# Patient Record
Sex: Male | Born: 1955 | Race: White | Hispanic: No | Marital: Married | Smoking: Never smoker
Health system: Southern US, Community
[De-identification: ages and names within clinical notes are randomized; demographics above are authoritative.]

## PROBLEM LIST (undated history)

## (undated) DIAGNOSIS — E785 Hyperlipidemia, unspecified: Secondary | ICD-10-CM

## (undated) DIAGNOSIS — I10 Essential (primary) hypertension: Secondary | ICD-10-CM

## (undated) DIAGNOSIS — N200 Calculus of kidney: Secondary | ICD-10-CM

## (undated) DIAGNOSIS — G4730 Sleep apnea, unspecified: Secondary | ICD-10-CM

## (undated) DIAGNOSIS — I219 Acute myocardial infarction, unspecified: Secondary | ICD-10-CM

## (undated) DIAGNOSIS — T7840XA Allergy, unspecified, initial encounter: Secondary | ICD-10-CM

## (undated) HISTORY — DX: Allergy, unspecified, initial encounter: T78.40XA

## (undated) HISTORY — PX: COLONOSCOPY: SHX174

## (undated) HISTORY — DX: Acute myocardial infarction, unspecified: I21.9

## (undated) HISTORY — DX: Calculus of kidney: N20.0

## (undated) HISTORY — PX: HERNIA REPAIR: SHX51

## (undated) HISTORY — DX: Essential (primary) hypertension: I10

## (undated) HISTORY — PX: OTHER SURGICAL HISTORY: SHX169

## (undated) HISTORY — PX: CARDIAC CATHETERIZATION: SHX172

## (undated) HISTORY — DX: Hyperlipidemia, unspecified: E78.5

## (undated) HISTORY — DX: Sleep apnea, unspecified: G47.30

---

## 2000-07-17 ENCOUNTER — Ambulatory Visit (HOSPITAL_BASED_OUTPATIENT_CLINIC_OR_DEPARTMENT_OTHER): Admission: RE | Admit: 2000-07-17 | Discharge: 2000-07-17 | Payer: Self-pay | Admitting: Internal Medicine

## 2004-02-24 ENCOUNTER — Ambulatory Visit: Payer: Self-pay | Admitting: Internal Medicine

## 2004-03-02 ENCOUNTER — Ambulatory Visit: Payer: Self-pay | Admitting: Internal Medicine

## 2004-06-03 ENCOUNTER — Ambulatory Visit: Payer: Self-pay | Admitting: Internal Medicine

## 2004-06-21 ENCOUNTER — Ambulatory Visit: Payer: Self-pay | Admitting: Internal Medicine

## 2004-08-09 ENCOUNTER — Ambulatory Visit: Payer: Self-pay | Admitting: Internal Medicine

## 2004-09-21 ENCOUNTER — Ambulatory Visit: Payer: Self-pay | Admitting: Internal Medicine

## 2004-10-11 ENCOUNTER — Ambulatory Visit: Payer: Self-pay | Admitting: Internal Medicine

## 2005-02-06 ENCOUNTER — Ambulatory Visit: Payer: Self-pay | Admitting: Internal Medicine

## 2005-02-13 ENCOUNTER — Ambulatory Visit: Payer: Self-pay | Admitting: Internal Medicine

## 2005-06-06 ENCOUNTER — Ambulatory Visit: Payer: Self-pay | Admitting: Internal Medicine

## 2005-06-13 ENCOUNTER — Ambulatory Visit: Payer: Self-pay | Admitting: Internal Medicine

## 2005-06-19 ENCOUNTER — Ambulatory Visit: Payer: Self-pay | Admitting: Endocrinology

## 2005-06-22 ENCOUNTER — Ambulatory Visit (HOSPITAL_BASED_OUTPATIENT_CLINIC_OR_DEPARTMENT_OTHER): Admission: RE | Admit: 2005-06-22 | Discharge: 2005-06-22 | Payer: Self-pay | Admitting: Internal Medicine

## 2005-07-04 ENCOUNTER — Ambulatory Visit: Payer: Self-pay | Admitting: Pulmonary Disease

## 2005-07-10 ENCOUNTER — Ambulatory Visit: Payer: Self-pay | Admitting: Endocrinology

## 2005-08-09 ENCOUNTER — Ambulatory Visit: Payer: Self-pay | Admitting: Endocrinology

## 2005-08-17 ENCOUNTER — Ambulatory Visit: Payer: Self-pay | Admitting: Internal Medicine

## 2005-08-23 ENCOUNTER — Ambulatory Visit: Payer: Self-pay | Admitting: Internal Medicine

## 2005-09-21 ENCOUNTER — Ambulatory Visit: Payer: Self-pay | Admitting: Internal Medicine

## 2005-10-04 ENCOUNTER — Ambulatory Visit: Payer: Self-pay | Admitting: Internal Medicine

## 2005-10-10 ENCOUNTER — Ambulatory Visit: Payer: Self-pay | Admitting: Internal Medicine

## 2005-10-26 ENCOUNTER — Ambulatory Visit: Payer: Self-pay | Admitting: Internal Medicine

## 2006-01-29 ENCOUNTER — Ambulatory Visit: Payer: Self-pay | Admitting: Internal Medicine

## 2006-01-29 LAB — CONVERTED CEMR LAB
ALT: 22 units/L (ref 0–40)
AST: 20 units/L (ref 0–37)
Albumin: 4.1 g/dL (ref 3.5–5.2)
Alkaline Phosphatase: 35 units/L — ABNORMAL LOW (ref 39–117)
BUN: 15 mg/dL (ref 6–23)
Bilirubin, Direct: 0.1 mg/dL (ref 0.0–0.3)
CO2: 28 meq/L (ref 19–32)
Calcium: 9.7 mg/dL (ref 8.4–10.5)
Chloride: 103 meq/L (ref 96–112)
Chol/HDL Ratio, serum: 5.1
Cholesterol: 183 mg/dL (ref 0–200)
Creatinine, Ser: 1 mg/dL (ref 0.4–1.5)
GFR calc non Af Amer: 84 mL/min
Glomerular Filtration Rate, Af Am: 102 mL/min/{1.73_m2}
Glucose, Bld: 253 mg/dL — ABNORMAL HIGH (ref 70–99)
HDL: 36.1 mg/dL — ABNORMAL LOW (ref >39.0)
Hgb A1c MFr Bld: 9.9 % — ABNORMAL HIGH (ref 4.6–6.0)
LDL Cholesterol: 112 mg/dL — ABNORMAL HIGH (ref 0–99)
Potassium: 5.1 meq/L (ref 3.5–5.1)
Sodium: 138 meq/L (ref 135–145)
Total Bilirubin: 0.8 mg/dL (ref 0.3–1.2)
Total Protein: 7.1 g/dL (ref 6.0–8.3)
Triglyceride fasting, serum: 176 mg/dL — ABNORMAL HIGH (ref 0–149)
VLDL: 35 mg/dL (ref 0–40)

## 2006-02-04 DIAGNOSIS — E1169 Type 2 diabetes mellitus with other specified complication: Secondary | ICD-10-CM | POA: Insufficient documentation

## 2006-02-04 DIAGNOSIS — Z794 Long term (current) use of insulin: Secondary | ICD-10-CM

## 2006-02-04 DIAGNOSIS — Z87442 Personal history of urinary calculi: Secondary | ICD-10-CM | POA: Insufficient documentation

## 2006-02-04 DIAGNOSIS — E119 Type 2 diabetes mellitus without complications: Secondary | ICD-10-CM

## 2006-02-04 DIAGNOSIS — I10 Essential (primary) hypertension: Secondary | ICD-10-CM | POA: Insufficient documentation

## 2006-02-04 DIAGNOSIS — E1159 Type 2 diabetes mellitus with other circulatory complications: Secondary | ICD-10-CM

## 2006-02-04 DIAGNOSIS — E785 Hyperlipidemia, unspecified: Secondary | ICD-10-CM | POA: Insufficient documentation

## 2006-02-04 HISTORY — DX: Type 2 diabetes mellitus without complications: E11.9

## 2006-02-05 ENCOUNTER — Ambulatory Visit: Payer: Self-pay | Admitting: Internal Medicine

## 2006-04-18 ENCOUNTER — Ambulatory Visit: Payer: Self-pay | Admitting: Internal Medicine

## 2006-05-21 ENCOUNTER — Ambulatory Visit: Payer: Self-pay | Admitting: Internal Medicine

## 2006-05-29 ENCOUNTER — Ambulatory Visit: Payer: Self-pay | Admitting: Internal Medicine

## 2006-05-29 LAB — CONVERTED CEMR LAB
ALT: 18 units/L (ref 0–40)
AST: 16 units/L (ref 0–37)
Albumin: 3.6 g/dL (ref 3.5–5.2)
Alkaline Phosphatase: 39 units/L (ref 39–117)
BUN: 15 mg/dL (ref 6–23)
Bilirubin, Direct: 0.1 mg/dL (ref 0.0–0.3)
CO2: 28 meq/L (ref 19–32)
Calcium: 9.3 mg/dL (ref 8.4–10.5)
Chloride: 105 meq/L (ref 96–112)
Cholesterol: 145 mg/dL (ref 0–200)
Creatinine, Ser: 0.9 mg/dL (ref 0.4–1.5)
Creatinine,U: 58.6 mg/dL
GFR calc Af Amer: 115 mL/min
GFR calc non Af Amer: 95 mL/min
Glucose, Bld: 161 mg/dL — ABNORMAL HIGH (ref 70–99)
HDL: 35.5 mg/dL — ABNORMAL LOW (ref >39.0)
Hgb A1c MFr Bld: 9.8 % — ABNORMAL HIGH (ref 4.6–6.0)
LDL Cholesterol: 81 mg/dL (ref 0–99)
Microalb Creat Ratio: 13.7 mg/g (ref 0.0–30.0)
Microalb, Ur: 0.8 mg/dL (ref 0.0–1.9)
Potassium: 5.2 meq/L — ABNORMAL HIGH (ref 3.5–5.1)
Sodium: 140 meq/L (ref 135–145)
Total Bilirubin: 0.6 mg/dL (ref 0.3–1.2)
Total CHOL/HDL Ratio: 4.1
Total Protein: 6.9 g/dL (ref 6.0–8.3)
Triglycerides: 141 mg/dL (ref 0–149)
VLDL: 28 mg/dL (ref 0–40)

## 2006-06-05 ENCOUNTER — Encounter: Payer: Self-pay | Admitting: Internal Medicine

## 2006-06-05 ENCOUNTER — Ambulatory Visit: Payer: Self-pay | Admitting: Internal Medicine

## 2006-06-05 DIAGNOSIS — K602 Anal fissure, unspecified: Secondary | ICD-10-CM

## 2006-06-05 HISTORY — DX: Anal fissure, unspecified: K60.2

## 2006-06-05 LAB — CONVERTED CEMR LAB
Cholesterol, target level: 200 mg/dL
HDL goal, serum: 40 mg/dL
LDL Goal: 100 mg/dL

## 2006-08-28 ENCOUNTER — Ambulatory Visit: Payer: Self-pay | Admitting: Internal Medicine

## 2006-08-28 LAB — CONVERTED CEMR LAB
ALT: 21 units/L (ref 0–40)
AST: 19 units/L (ref 0–37)
Albumin: 3.8 g/dL (ref 3.5–5.2)
Alkaline Phosphatase: 31 units/L — ABNORMAL LOW (ref 39–117)
BUN: 15 mg/dL (ref 6–23)
Bilirubin, Direct: 0.1 mg/dL (ref 0.0–0.3)
CO2: 29 meq/L (ref 19–32)
Calcium: 9.5 mg/dL (ref 8.4–10.5)
Chloride: 107 meq/L (ref 96–112)
Cholesterol: 254 mg/dL (ref 0–200)
Creatinine, Ser: 0.8 mg/dL (ref 0.4–1.5)
Direct LDL: 161.1 mg/dL
GFR calc Af Amer: 131 mL/min
GFR calc non Af Amer: 108 mL/min
Glucose, Bld: 176 mg/dL — ABNORMAL HIGH (ref 70–99)
HDL: 41.2 mg/dL (ref >39.0)
Hgb A1c MFr Bld: 8.8 % — ABNORMAL HIGH (ref 4.6–6.0)
Potassium: 4.9 meq/L (ref 3.5–5.1)
Sodium: 140 meq/L (ref 135–145)
Total Bilirubin: 0.9 mg/dL (ref 0.3–1.2)
Total CHOL/HDL Ratio: 6.2
Total Protein: 6.7 g/dL (ref 6.0–8.3)
Triglycerides: 238 mg/dL (ref 0–149)
VLDL: 48 mg/dL — ABNORMAL HIGH (ref 0–40)

## 2006-09-04 ENCOUNTER — Ambulatory Visit: Payer: Self-pay | Admitting: Internal Medicine

## 2006-11-07 ENCOUNTER — Encounter: Payer: Self-pay | Admitting: Endocrinology

## 2006-11-21 ENCOUNTER — Ambulatory Visit: Payer: Self-pay | Admitting: Internal Medicine

## 2006-12-24 ENCOUNTER — Ambulatory Visit: Payer: Self-pay | Admitting: Internal Medicine

## 2006-12-24 LAB — CONVERTED CEMR LAB
ALT: 20 units/L (ref 0–53)
AST: 18 units/L (ref 0–37)
Albumin: 3.8 g/dL (ref 3.5–5.2)
Alkaline Phosphatase: 37 units/L — ABNORMAL LOW (ref 39–117)
BUN: 20 mg/dL (ref 6–23)
Bilirubin, Direct: 0.2 mg/dL (ref 0.0–0.3)
CO2: 29 meq/L (ref 19–32)
Calcium: 9.5 mg/dL (ref 8.4–10.5)
Chloride: 106 meq/L (ref 96–112)
Cholesterol: 182 mg/dL (ref 0–200)
Creatinine, Ser: 0.8 mg/dL (ref 0.4–1.5)
GFR calc Af Amer: 131 mL/min
GFR calc non Af Amer: 108 mL/min
Glucose, Bld: 183 mg/dL — ABNORMAL HIGH (ref 70–99)
HDL: 32.9 mg/dL — ABNORMAL LOW (ref >39.0)
Hgb A1c MFr Bld: 10.4 % — ABNORMAL HIGH (ref 4.6–6.0)
LDL Cholesterol: 111 mg/dL — ABNORMAL HIGH (ref 0–99)
Potassium: 4.8 meq/L (ref 3.5–5.1)
Sodium: 142 meq/L (ref 135–145)
Total Bilirubin: 0.9 mg/dL (ref 0.3–1.2)
Total CHOL/HDL Ratio: 5.5
Total Protein: 6.3 g/dL (ref 6.0–8.3)
Triglycerides: 192 mg/dL — ABNORMAL HIGH (ref 0–149)
VLDL: 38 mg/dL (ref 0–40)

## 2007-01-02 ENCOUNTER — Ambulatory Visit: Payer: Self-pay | Admitting: Internal Medicine

## 2007-03-08 ENCOUNTER — Telehealth: Payer: Self-pay | Admitting: Internal Medicine

## 2007-04-29 ENCOUNTER — Ambulatory Visit: Payer: Self-pay | Admitting: Internal Medicine

## 2007-04-29 LAB — CONVERTED CEMR LAB
Albumin: 4 g/dL (ref 3.5–5.2)
BUN: 16 mg/dL (ref 6–23)
GFR calc non Af Amer: 95 mL/min
HDL: 35.9 mg/dL — ABNORMAL LOW (ref >39.0)
Hgb A1c MFr Bld: 9.3 % — ABNORMAL HIGH (ref 4.6–6.0)
LDL Cholesterol: 91 mg/dL (ref 0–99)
Potassium: 4.6 meq/L (ref 3.5–5.1)
Sodium: 139 meq/L (ref 135–145)
Triglycerides: 144 mg/dL (ref 0–149)
VLDL: 29 mg/dL (ref 0–40)

## 2007-05-06 ENCOUNTER — Ambulatory Visit: Payer: Self-pay | Admitting: Internal Medicine

## 2007-05-06 LAB — HM DIABETES FOOT EXAM

## 2007-08-27 ENCOUNTER — Ambulatory Visit: Payer: Self-pay | Admitting: Internal Medicine

## 2007-08-27 LAB — CONVERTED CEMR LAB
Albumin: 3.7 g/dL (ref 3.5–5.2)
Alkaline Phosphatase: 33 units/L — ABNORMAL LOW (ref 39–117)
BUN: 20 mg/dL (ref 6–23)
Calcium: 9.6 mg/dL (ref 8.4–10.5)
Cholesterol: 153 mg/dL (ref 0–200)
Creatinine, Ser: 0.8 mg/dL (ref 0.4–1.5)
GFR calc Af Amer: 131 mL/min
Glucose, Bld: 211 mg/dL — ABNORMAL HIGH (ref 70–99)
HDL: 35.1 mg/dL — ABNORMAL LOW (ref >39.0)
Total Protein: 6.5 g/dL (ref 6.0–8.3)
Triglycerides: 174 mg/dL — ABNORMAL HIGH (ref 0–149)
VLDL: 35 mg/dL (ref 0–40)

## 2007-09-23 ENCOUNTER — Ambulatory Visit: Payer: Self-pay | Admitting: Internal Medicine

## 2007-11-25 ENCOUNTER — Observation Stay (HOSPITAL_COMMUNITY): Admission: EM | Admit: 2007-11-25 | Discharge: 2007-11-26 | Payer: Self-pay | Admitting: Emergency Medicine

## 2007-12-05 ENCOUNTER — Telehealth (INDEPENDENT_AMBULATORY_CARE_PROVIDER_SITE_OTHER): Payer: Self-pay | Admitting: *Deleted

## 2008-01-13 ENCOUNTER — Ambulatory Visit: Payer: Self-pay | Admitting: Internal Medicine

## 2008-01-13 LAB — CONVERTED CEMR LAB
ALT: 25 units/L (ref 0–53)
AST: 20 units/L (ref 0–37)
CO2: 27 meq/L (ref 19–32)
Chloride: 104 meq/L (ref 96–112)
Cholesterol: 149 mg/dL (ref 0–200)
Creatinine, Ser: 0.8 mg/dL (ref 0.4–1.5)
Hgb A1c MFr Bld: 9.2 % — ABNORMAL HIGH (ref 4.6–6.0)
Total Bilirubin: 0.7 mg/dL (ref 0.3–1.2)
Total CHOL/HDL Ratio: 4.2
Triglycerides: 128 mg/dL (ref 0–149)

## 2008-01-20 ENCOUNTER — Ambulatory Visit: Payer: Self-pay | Admitting: Internal Medicine

## 2008-05-04 ENCOUNTER — Ambulatory Visit: Payer: Self-pay | Admitting: Internal Medicine

## 2008-05-04 LAB — CONVERTED CEMR LAB
Alkaline Phosphatase: 32 units/L — ABNORMAL LOW (ref 39–117)
Bilirubin, Direct: 0.1 mg/dL (ref 0.0–0.3)
GFR calc Af Amer: 131 mL/min
GFR calc non Af Amer: 108 mL/min
LDL Cholesterol: 70 mg/dL (ref 0–99)
Potassium: 4.5 meq/L (ref 3.5–5.1)
Sodium: 136 meq/L (ref 135–145)
Total Bilirubin: 0.9 mg/dL (ref 0.3–1.2)
VLDL: 37 mg/dL (ref 0–40)

## 2008-05-11 ENCOUNTER — Ambulatory Visit: Payer: Self-pay | Admitting: Internal Medicine

## 2008-05-11 LAB — HM COLONOSCOPY

## 2008-08-26 ENCOUNTER — Ambulatory Visit: Payer: Self-pay | Admitting: Internal Medicine

## 2008-08-26 LAB — CONVERTED CEMR LAB
Albumin: 3.7 g/dL (ref 3.5–5.2)
CO2: 27 meq/L (ref 19–32)
Calcium: 9 mg/dL (ref 8.4–10.5)
Chloride: 112 meq/L (ref 96–112)
HDL: 38.8 mg/dL — ABNORMAL LOW (ref >39.00)
Hgb A1c MFr Bld: 9.9 % — ABNORMAL HIGH (ref 4.6–6.5)
LDL Cholesterol: 103 mg/dL — ABNORMAL HIGH (ref 0–99)
Sodium: 143 meq/L (ref 135–145)
Total CHOL/HDL Ratio: 4
Triglycerides: 131 mg/dL (ref 0.0–149.0)

## 2008-09-02 ENCOUNTER — Ambulatory Visit: Payer: Self-pay | Admitting: Internal Medicine

## 2008-12-30 ENCOUNTER — Ambulatory Visit: Payer: Self-pay | Admitting: Internal Medicine

## 2008-12-30 LAB — CONVERTED CEMR LAB
ALT: 25 units/L (ref 0–53)
Albumin: 4 g/dL (ref 3.5–5.2)
Alkaline Phosphatase: 37 units/L — ABNORMAL LOW (ref 39–117)
Chloride: 98 meq/L (ref 96–112)
Cholesterol: 126 mg/dL (ref 0–200)
Creatinine, Ser: 0.8 mg/dL (ref 0.4–1.5)
GFR calc non Af Amer: 107.32 mL/min (ref >60)
Hgb A1c MFr Bld: 10.1 % — ABNORMAL HIGH (ref 4.6–6.5)
LDL Cholesterol: 58 mg/dL (ref 0–99)
Total Protein: 6.6 g/dL (ref 6.0–8.3)
Triglycerides: 184 mg/dL — ABNORMAL HIGH (ref 0.0–149.0)
VLDL: 36.8 mg/dL (ref 0.0–40.0)

## 2009-01-07 ENCOUNTER — Ambulatory Visit: Payer: Self-pay | Admitting: Internal Medicine

## 2009-05-04 ENCOUNTER — Ambulatory Visit: Payer: Self-pay | Admitting: Internal Medicine

## 2009-05-04 LAB — CONVERTED CEMR LAB
ALT: 19 units/L (ref 0–53)
AST: 17 units/L (ref 0–37)
Alkaline Phosphatase: 44 units/L (ref 39–117)
BUN: 15 mg/dL (ref 6–23)
Bilirubin, Direct: 0.1 mg/dL (ref 0.0–0.3)
Calcium: 9.2 mg/dL (ref 8.4–10.5)
Cholesterol: 124 mg/dL (ref 0–200)
Creatinine, Ser: 0.8 mg/dL (ref 0.4–1.5)
GFR calc non Af Amer: 107.18 mL/min (ref >60)
Glucose, Bld: 182 mg/dL — ABNORMAL HIGH (ref 70–99)
Hgb A1c MFr Bld: 10.6 % — ABNORMAL HIGH (ref 4.6–6.5)
Potassium: 4.1 meq/L (ref 3.5–5.1)
Total Bilirubin: 0.6 mg/dL (ref 0.3–1.2)
Total Protein: 6.7 g/dL (ref 6.0–8.3)
VLDL: 31.4 mg/dL (ref 0.0–40.0)

## 2009-05-11 ENCOUNTER — Ambulatory Visit: Payer: Self-pay | Admitting: Internal Medicine

## 2009-09-24 ENCOUNTER — Ambulatory Visit: Payer: Self-pay | Admitting: Internal Medicine

## 2009-09-24 LAB — CONVERTED CEMR LAB
ALT: 21 units/L (ref 0–53)
AST: 17 units/L (ref 0–37)
Albumin: 4 g/dL (ref 3.5–5.2)
Alkaline Phosphatase: 43 units/L (ref 39–117)
BUN: 16 mg/dL (ref 6–23)
Chloride: 105 meq/L (ref 96–112)
GFR calc non Af Amer: 102.57 mL/min (ref >60)
Glucose, Bld: 188 mg/dL — ABNORMAL HIGH (ref 70–99)
Hgb A1c MFr Bld: 10.1 % — ABNORMAL HIGH (ref 4.6–6.5)
Potassium: 4.8 meq/L (ref 3.5–5.1)
Sodium: 140 meq/L (ref 135–145)

## 2009-09-24 LAB — HM DIABETES EYE EXAM

## 2009-10-01 ENCOUNTER — Ambulatory Visit: Payer: Self-pay | Admitting: Internal Medicine

## 2009-10-11 ENCOUNTER — Telehealth: Payer: Self-pay | Admitting: Internal Medicine

## 2009-10-11 DIAGNOSIS — M25519 Pain in unspecified shoulder: Secondary | ICD-10-CM

## 2010-01-28 ENCOUNTER — Ambulatory Visit: Payer: Self-pay | Admitting: Internal Medicine

## 2010-01-28 LAB — CONVERTED CEMR LAB
AST: 21 units/L (ref 0–37)
Alkaline Phosphatase: 43 units/L (ref 39–117)
Bilirubin, Direct: 0.1 mg/dL (ref 0.0–0.3)
CO2: 24 meq/L (ref 19–32)
Calcium: 9.2 mg/dL (ref 8.4–10.5)
GFR calc non Af Amer: 106.88 mL/min (ref >60)
Glucose, Bld: 150 mg/dL — ABNORMAL HIGH (ref 70–99)
HDL: 36.5 mg/dL — ABNORMAL LOW (ref >39.00)
Potassium: 4.4 meq/L (ref 3.5–5.1)
Sodium: 137 meq/L (ref 135–145)
Total CHOL/HDL Ratio: 5
Triglycerides: 213 mg/dL — ABNORMAL HIGH (ref 0.0–149.0)

## 2010-02-04 ENCOUNTER — Ambulatory Visit: Payer: Self-pay | Admitting: Internal Medicine

## 2010-04-26 NOTE — Assessment & Plan Note (Signed)
Summary: fup on labs//ccm [facility] BMP/NJR   Vital Signs:  Patient profile:   55 year old male Weight:      250 pounds Temp:     98.5 degrees F oral Pulse rate:   68 / minute Pulse rhythm:   regular Resp:     12 per minute BP sitting:   144 / 78  (left arm) Cuff size:   regular  Vitals Entered By: Gladis Riffle, RN (October 01, 2009 9:45 AM) CC: FU labs--has not checked CBGs lately Is Patient Diabetic? Yes Did you bring your meter with you today? No   CC:  FU labs--has not checked CBGs lately.  History of Present Illness:  Follow-Up Visit      This is a 55 year old man who presents for Follow-up visit.  The patient denies chest pain and palpitations.  Since the last visit the patient notes no new problems or concerns (2 weeks ago with kidney stone---resovled).  The patient reports taking meds as prescribed.  When questioned about possible medication side effects, the patient notes none.    All other systems reviewed and were negative   Preventive Screening-Counseling & Management  Alcohol-Tobacco     Smoking Status: never  Current Problems (verified): 1)  Rectal Fissure  (ICD-565.0) 2)  Nephrolithiasis, Hx of  (ICD-V13.01) 3)  Hypertension  (ICD-401.9) 4)  Hyperlipidemia  (ICD-272.4) 5)  Diabetes Mellitus, Type II  (ICD-250.00)  Current Medications (verified): 1)  Astelin 137 Mcg/spray Soln (Azelastine Hcl) .... Inhale 1 Puff Into Both Nostrils Twice A Day As Needed 2)  Bayer Childrens Aspirin 81 Mg Chew (Aspirin) .... Take 1 Tablet By Mouth Once A Day 3)  Glucovance 2.5-500 Mg Tabs (Glyburide-Metformin) .... Take Two Tablets Two Times A Day 4)  Humalog Kwikpen 100 Unit/ml Soln (Insulin Lispro (Human)) .... Inject As Directed--(15am and 25 Pmsupper) 5)  Lantus 100 Unit/ml Soln (Insulin Glargine) .... 80units Q Hs 6)  Lisinopril 40 Mg Tabs (Lisinopril) .... Take 1 Tablet By Mouth Once A Day 7)  Simvastatin 80 Mg Tabs (Simvastatin) .Marland Kitchen.. 1 Po At Bedtime 8)  Onetouch Test   Strp  (Glucose Blood) .... Use Once Daily and As Directed 9)  Niacin 500 Mg Tabs (Niacin) .... .qhstab  Allergies (verified): No Known Drug Allergies  Past History:  Past Medical History: Last updated: 02/04/2006 Diabetes mellitus, type II Hyperlipidemia Hypertension Nephrolithiasis, hx of OSA  Past Surgical History: Last updated: 02/04/2006 Inguinal herniorrhaphy  Family History: Last updated: 05/06/2007 Family History of CAD Male 1st degree relative <50 Family History Diabetes 1st degree relative father deceased 28 yo---liver disease---ruptured spleen, hx of heart disease.  Social History: Last updated: 06/05/2006 Occupation: Married Never Smoked Alcohol use-no Regular exercise-yes  Risk Factors: Exercise: yes (06/05/2006)  Risk Factors: Smoking Status: never (10/01/2009)  Physical Exam  General:  alert and well-developed.   Head:  normocephalic and atraumatic.   Eyes:  pupils equal and pupils round.   Ears:  R ear normal and L ear normal.   Neck:  No deformities, masses, or tenderness noted. Chest Wall:  No deformities, masses, tenderness or gynecomastia noted. Lungs:  normal respiratory effort and no intercostal retractions.   Heart:  normal rate and regular rhythm.   Abdomen:  Bowel sounds positive,abdomen soft and non-tender without masses, organomegaly or hernias noted.  obese Msk:  No deformity or scoliosis noted of thoracic or lumbar spine.   decreased abduction left worse than right Neurologic:  cranial nerves II-XII intact and gait  normal.    Diabetes Management Exam:    Eye Exam:       Eye Exam done elsewhere          Date: 09/24/2009          Results: normal          Done by: ophth   Impression & Recommendations:  Problem # 1:  DIABETES MELLITUS, TYPE II (ICD-250.00) not controlled advised aggressive weight loss he has been out of humalog for 6 weeks---resume His updated medication list for this problem includes:    Bayer Childrens  Aspirin 81 Mg Chew (Aspirin) .Marland Kitchen... Take 1 tablet by mouth once a day    Glucovance 2.5-500 Mg Tabs (Glyburide-metformin) .Marland Kitchen... Take two tablets two times a day    Humalog Kwikpen 100 Unit/ml Soln (Insulin lispro (human)) ..... Inject as directed--(15am and 25 pmsupper)    Lantus 100 Unit/ml Soln (Insulin glargine) .Marland KitchenMarland KitchenMarland KitchenMarland Kitchen 80units q hs    Lisinopril 40 Mg Tabs (Lisinopril) .Marland Kitchen... Take 1 tablet by mouth once a day  Labs Reviewed: Creat: 0.8 (09/24/2009)     Last Eye Exam: normal-pt's report (08/25/2008) Reviewed HgBA1c results: 10.1 (09/24/2009)  10.6 (05/04/2009)  Problem # 2:  HYPERLIPIDEMIA (ICD-272.4) controlled he had self dcd simvastatin to 40 mg by mouth once daily (or sometimes less--- every other day) continue current medications   His updated medication list for this problem includes:    Simvastatin 80 Mg Tabs (Simvastatin) .Marland Kitchen... 1/2 po at bedtime    Niacin 500 Mg Tabs (Niacin) ..... .qhstab  Labs Reviewed: SGOT: 17 (09/24/2009)   SGPT: 21 (09/24/2009)  Lipid Goals: Chol Goal: 200 (06/05/2006)   HDL Goal: 40 (06/05/2006)   LDL Goal: 100 (06/05/2006)   TG Goal: 150 (06/05/2006)  Prior 10 Yr Risk Heart Disease: 18 % (01/02/2007)   HDL:37.30 (09/24/2009), 36.00 (05/04/2009)  LDL:57 (05/04/2009), 58 (12/30/2008)  Chol:180 (09/24/2009), 124 (05/04/2009)  Trig:240.0 (09/24/2009), 157.0 (05/04/2009)  Problem # 3:  RECTAL FISSURE (ICD-565.0) resolved  Problem # 4:  HYPERTENSION (ICD-401.9) best treatment option is weight loss His updated medication list for this problem includes:    Lisinopril 40 Mg Tabs (Lisinopril) .Marland Kitchen... Take 1 tablet by mouth once a day  BP today: 144/78 Prior BP: 138/82 (05/11/2009)  Prior 10 Yr Risk Heart Disease: 18 % (01/02/2007)  Labs Reviewed: K+: 4.8 (09/24/2009) Creat: : 0.8 (09/24/2009)   Chol: 180 (09/24/2009)   HDL: 37.30 (09/24/2009)   LDL: 57 (05/04/2009)   TG: 240.0 (09/24/2009)  Complete Medication List: 1)  Astelin 137 Mcg/spray Soln  (Azelastine hcl) .... Inhale 1 puff into both nostrils twice a day as needed 2)  Bayer Childrens Aspirin 81 Mg Chew (Aspirin) .... Take 1 tablet by mouth once a day 3)  Glucovance 2.5-500 Mg Tabs (Glyburide-metformin) .... Take two tablets two times a day 4)  Humalog Kwikpen 100 Unit/ml Soln (Insulin lispro (human)) .... Inject as directed--(15am and 25 pmsupper) 5)  Lantus 100 Unit/ml Soln (Insulin glargine) .... 80units q hs 6)  Lisinopril 40 Mg Tabs (Lisinopril) .... Take 1 tablet by mouth once a day 7)  Simvastatin 80 Mg Tabs (Simvastatin) .... 1/2 po at bedtime 8)  Onetouch Test Strp (Glucose blood) .... Use once daily and as directed 9)  Niacin 500 Mg Tabs (Niacin) .... .qhstab 10)  Hydrocodone-acetaminophen 5-325 Mg Tabs (Hydrocodone-acetaminophen) .Marland Kitchen.. 1 by mouth up to 4 times per day as needed for pain  Patient Instructions: 1)  Please schedule a follow-up appointment in 4 months. 2)  labs one week prior to visit 3)  lipids---272.4 4)  lfts-995.2 5)  bmet-995.2 6)  A1C-250.02 7)     Prescriptions: HYDROCODONE-ACETAMINOPHEN 5-325 MG TABS (HYDROCODONE-ACETAMINOPHEN) 1 by mouth up to 4 times per day as needed for pain  #20 x 0   Entered and Authorized by:   Birdie Sons MD   Signed by:   Birdie Sons MD on 10/01/2009   Method used:   Print then Give to Patient   RxID:   [id number] LANTUS 100 UNIT/ML SOLN (INSULIN GLARGINE) 80UNITS Q HS  #56ml x 5   Entered and Authorized by:   Birdie Sons MD   Signed by:   Birdie Sons MD on 10/01/2009   Method used:   Electronically to        [facility] (retail)       [address]       [place], [place]  [address]       Ph: [phone] or [phone]       Fax: [phone]   RxID:   [id number] HUMALOG KWIKPEN 100 UNIT/ML SOLN (INSULIN LISPRO (HUMAN)) Inject as directed--(15AM AND 25 PMsupper)  #3 boxes x 11   Entered and Authorized by:   Birdie Sons MD   Signed by:   Birdie Sons  MD on 10/01/2009   Method used:   Electronically to        [facility] (retail)       [address]       [place], [place]  [address]       Ph: [phone] or [phone]       Fax: [phone]   RxID:   [id number]

## 2010-04-26 NOTE — Progress Notes (Signed)
Summary: PT appt.  Phone Note Call from Patient   Caller: Patient Call For: Birdie Sons MD Summary of Call: Pt is requesting appt at [facility]. [phone] States he is needing therapy on both shoulders, and he has spoken to Dr. Cato Mulligan about this previously in an office visit. [phone] Initial call taken by: Lynann Beaver CMA,  October 11, 2009 9:09 AM  Follow-up for Phone Call        ok Follow-up by: Birdie Sons MD,  October 11, 2009 1:12 PM  Additional Follow-up for Phone Call Additional follow up Details #1::        order in process.  Patient notified. will await when etc. Additional Follow-up by: Gladis Riffle, RN,  October 11, 2009 2:17 PM  New Problems: SHOULDER PAIN (ICD-719.41)   New Problems: SHOULDER PAIN (ICD-719.41)

## 2010-04-26 NOTE — Assessment & Plan Note (Signed)
Summary: 4 month rov/njr   Vital Signs:  Patient profile:   55 year old male Height:      70.5 inches Weight:      255 pounds BMI:     36.20 Temp:     98.9 degrees F oral Pulse rate:   76 / minute Pulse rhythm:   regular BP sitting:   146 / 86  (left arm) Cuff size:   large  Vitals Entered By: Alfred Levins, CMA (February 04, 2010 8:51 AM) CC: discuss labs   CC:  discuss labs.  History of Present Illness:  Follow-Up Visit      This is a 55 year old man who presents for Follow-up visit.  The patient denies chest pain and palpitations.  Since the last visit the patient notes no new problems or concerns.  The patient reports taking meds as prescribed (admits to missing several mealtime humalog doses), not monitoring blood sugars, dietary noncompliance, and not exercising.  When questioned about possible medication side effects, the patient notes none.    All other systems reviewed and were negative   Current Medications (verified): 1)  Astelin 137 Mcg/spray Soln (Azelastine Hcl) .... Inhale 1 Puff Into Both Nostrils Twice A Day As Needed 2)  Bayer Childrens Aspirin 81 Mg Chew (Aspirin) .... Take 1 Tablet By Mouth Once A Day 3)  Glucovance 2.5-500 Mg Tabs (Glyburide-Metformin) .... Take Two Tablets Two Times A Day 4)  Humalog Kwikpen 100 Unit/ml Soln (Insulin Lispro (Human)) .... Inject As Directed--(15am and 25 Pmsupper) 5)  Lantus 100 Unit/ml Soln (Insulin Glargine) .... 80units Q Hs 6)  Lisinopril 40 Mg Tabs (Lisinopril) .... Take 1 Tablet By Mouth Once A Day 7)  Simvastatin 80 Mg Tabs (Simvastatin) .... 1/2 Po At Bedtime 8)  Onetouch Test   Strp (Glucose Blood) .... Use Once Daily and As Directed 9)  Niacin 500 Mg Tabs (Niacin) .... .qhstab  Allergies (verified): No Known Drug Allergies  Physical Exam  General:  overweight male in no acute distress. HEENT exam atraumatic, normocephalic symmetric her muscles are intact. Neck is supple without lymphadenopathy or thyromegaly.  Chest clear to auscultation without increased work of breathing. Cardiac exam S1-S2 are regular. Abdominal exam active, soft and nontender extremities no clubbing cyanosis or edema.   Impression & Recommendations:  Problem # 1:  HYPERTENSION (ICD-401.9) I think the best treatment option here is weight loss. Discussed with the patient. If he is unable to do that by next visit we'll consider additional medications. His updated medication list for this problem includes:    Lisinopril 40 Mg Tabs (Lisinopril) .Marland Kitchen... Take 1 tablet by mouth once a day  BP today: 146/86 Prior BP: 144/78 (10/01/2009)  Prior 10 Yr Risk Heart Disease: 18 % (01/02/2007)  Labs Reviewed: K+: 4.4 (01/28/2010) Creat: : 0.8 (01/28/2010)   Chol: 173 (01/28/2010)   HDL: 36.50 (01/28/2010)   LDL: 57 (05/04/2009)   TG: 213.0 (01/28/2010)  Problem # 2:  HYPERLIPIDEMIA (ICD-272.4) fair control. Continue current medications. His updated medication list for this problem includes:    Simvastatin 80 Mg Tabs (Simvastatin) .Marland Kitchen... 1/2 po at bedtime    Niacin 500 Mg Tabs (Niacin) ..... .qhstab  Labs Reviewed: SGOT: 21 (01/28/2010)   SGPT: 25 (01/28/2010)  Lipid Goals: Chol Goal: 200 (06/05/2006)   HDL Goal: 40 (06/05/2006)   LDL Goal: 100 (06/05/2006)   TG Goal: 150 (06/05/2006)  Prior 10 Yr Risk Heart Disease: 18 % (01/02/2007)   HDL:36.50 (01/28/2010), 37.30 (09/24/2009)  LDL:57 (  05/04/2009), 58 (12/30/2008)  Chol:173 (01/28/2010), 180 (09/24/2009)  Trig:213.0 (01/28/2010), 240.0 (09/24/2009)  Problem # 3:  DIABETES MELLITUS, TYPE II (ICD-250.00) poor control. This is mostly due to lifestyle and noncompliance with a complicated insulin regimen. His insulin regimen is going to be difficult because he eats breakfast intermittently usually eats lunch but doesn't take his insulin and and has a large dinner. I will try to divide up his Lantus and see if that helps. If that doesn't work we may need to change insulin regimens  altogether. His updated medication list for this problem includes:    Bayer Childrens Aspirin 81 Mg Chew (Aspirin) .Marland Kitchen... Take 1 tablet by mouth once a day    Glucovance 2.5-500 Mg Tabs (Glyburide-metformin) .Marland Kitchen... Take two tablets two times a day    Humalog Kwikpen 100 Unit/ml Soln (Insulin lispro (human)) ..... Inject as directed--(15am and 25 pmsupper)    Lantus 100 Unit/ml Soln (Insulin glargine) .Marland KitchenMarland KitchenMarland KitchenMarland Kitchen 45 units subcutaneously two times a day    Lisinopril 40 Mg Tabs (Lisinopril) .Marland Kitchen... Take 1 tablet by mouth once a day  Labs Reviewed: Creat: 0.8 (01/28/2010)     Last Eye Exam: normal (09/24/2009) Reviewed HgBA1c results: 9.9 (01/28/2010)  10.1 (09/24/2009)  Complete Medication List: 1)  Astelin 137 Mcg/spray Soln (Azelastine hcl) .... Inhale 1 puff into both nostrils twice a day as needed 2)  Bayer Childrens Aspirin 81 Mg Chew (Aspirin) .... Take 1 tablet by mouth once a day 3)  Glucovance 2.5-500 Mg Tabs (Glyburide-metformin) .... Take two tablets two times a day 4)  Humalog Kwikpen 100 Unit/ml Soln (Insulin lispro (human)) .... Inject as directed--(15am and 25 pmsupper) 5)  Lantus 100 Unit/ml Soln (Insulin glargine) .... 45 units subcutaneously two times a day 6)  Lisinopril 40 Mg Tabs (Lisinopril) .... Take 1 tablet by mouth once a day 7)  Simvastatin 80 Mg Tabs (Simvastatin) .... 1/2 po at bedtime 8)  Onetouch Test Strp (Glucose blood) .... Use once daily and as directed 9)  Niacin 500 Mg Tabs (Niacin) .... .Hope Pigeon  Other Orders: Admin 1st Vaccine (14782) Flu Vaccine 59yrs + (95621)  Patient Instructions: 1)  Please schedule a follow-up appointment in 4 months. 2)  labs one week prior to visit 3)  lipids---272.4 4)  lfts-995.2 5)  bmet-995.2 6)  A1C-250.02 7)     Flu Vaccine Consent Questions     Do you have a history of severe allergic reactions to this vaccine? no    Any prior history of allergic reactions to egg and/or gelatin? no    Do you have a sensitivity to the  preservative Thimersol? no    Do you have a past history of Guillan-Barre Syndrome? no    Do you currently have an acute febrile illness? no    Have you ever had a severe reaction to latex? no    Vaccine information given and explained to patient? yes    Are you currently pregnant? no    Lot Number:[id number]   Exp Date:09/24/2010   Site Given  Left Deltoid IM  Orders Added: 1)  Admin 1st Vaccine [90471] 2)  Flu Vaccine 18yrs + [30865] 3)  Est. Patient Level IV [78469]    .[email or web address]

## 2010-04-26 NOTE — Assessment & Plan Note (Signed)
Summary: 4 month rov/njr   Vital Signs:  Patient profile:   55 year old male Weight:      255 pounds Temp:     98.1 degrees F Pulse rate:   76 / minute Resp:     12 per minute BP sitting:   138 / 82  (left arm)  Vitals Entered By: Gladis Riffle, RN (May 11, 2009 8:18 AM) CC: 4 month rov, labs done Is Patient Diabetic? Yes Did you bring your meter with you today? No Comments CBGs average 150-190 at home   CC:  4 month rov and labs done.  History of Present Illness:  Follow-Up Visit      This is a 55 year old man who presents for Follow-up visit.  The patient denies palpitations and dizziness.  Since the last visit the patient notes no new problems or concerns except yesterday from 3-10 am had severe GERD symptoms--midabdomen to chest---typical acid sensation. Sxs resolved and feels great today (no known causative sxs).  The patient reports taking meds as prescribed.  When questioned about possible medication side effects, the patient notes none.    All other systems reviewed and were negative except above (maybe a little more nocturnal GERD past 3-4 weeks). also complains of bilateral shoulder pain with abduction or lifting arms above heads---can't throw overhand.     Preventive Screening-Counseling & Management  Alcohol-Tobacco     Smoking Status: never  Medications Prior to Update: 1)  Astelin 137 Mcg/spray Soln (Azelastine Hcl) .... Inhale 1 Puff Into Both Nostrils Twice A Day As Needed 2)  Bayer Childrens Aspirin 81 Mg Chew (Aspirin) .... Take 1 Tablet By Mouth Once A Day 3)  Glucovance 2.5-500 Mg Tabs (Glyburide-Metformin) .... Take Two Tablets Two Times A Day 4)  Humalog Kwikpen 100 Unit/ml Soln (Insulin Lispro (Human)) .... Inject As Directed--(15am and 25 Pmsupper) 5)  Lantus 100 Unit/ml Soln (Insulin Glargine) .... 75 Units Q Hs 6)  Lisinopril 40 Mg Tabs (Lisinopril) .... Take 1 Tablet By Mouth Once A Day 7)  Simvastatin 80 Mg Tabs (Simvastatin) .Marland Kitchen.. 1 Po At  Bedtime 8)  Onetouch Test   Strp (Glucose Blood) .... Use Once Daily and As Directed 9)  Niacin 500 Mg Tabs (Niacin) .... .qhstab  Allergies (verified): No Known Drug Allergies  Past History:  Past Medical History: Last updated: 02/04/2006 Diabetes mellitus, type II Hyperlipidemia Hypertension Nephrolithiasis, hx of OSA  Past Surgical History: Last updated: 02/04/2006 Inguinal herniorrhaphy  Family History: Last updated: 05/06/2007 Family History of CAD Male 1st degree relative <50 Family History Diabetes 1st degree relative father deceased 12 yo---liver disease---ruptured spleen, hx of heart disease.  Social History: Last updated: 06/05/2006 Occupation: Married Never Smoked Alcohol use-no Regular exercise-yes  Risk Factors: Exercise: yes (06/05/2006)  Risk Factors: Smoking Status: never (05/11/2009)  Physical Exam  General:  Well-developed,well-nourished,in no acute distress; alert,appropriate and cooperative throughout examination  obese Head:  normocephalic and atraumatic.   Eyes:  pupils equal and pupils round.   Ears:  R ear normal and L ear normal.   Neck:  No deformities, masses, or tenderness noted. Chest Wall:  No deformities, masses, tenderness or gynecomastia noted. Lungs:  Normal respiratory effort, chest expands symmetrically. Lungs are clear to auscultation, no crackles or wheezes. Heart:  Normal rate and regular rhythm. S1 and S2 normal without gallop, murmur, click, rub or other extra sounds. Abdomen:  Bowel sounds positive,abdomen soft and non-tender without masses, organomegaly or hernias noted.  obese Msk:  No  deformity or scoliosis noted of thoracic or lumbar spine.   decreased abduction left worse than right Pulses:  R radial normal and L radial normal.   Neurologic:  cranial nerves II-XII intact and gait normal.   Skin:  turgor normal and color normal.   Psych:  memory intact for recent and remote and not anxious appearing.      Impression & Recommendations:  Problem # 1:  HYPERLIPIDEMIA (ICD-272.4) controlled continue current medications  His updated medication list for this problem includes:    Simvastatin 80 Mg Tabs (Simvastatin) .Marland Kitchen... 1 po at bedtime    Niacin 500 Mg Tabs (Niacin) ..... .qhstab  Labs Reviewed: SGOT: 17 (05/04/2009)   SGPT: 19 (05/04/2009)  Lipid Goals: Chol Goal: 200 (06/05/2006)   HDL Goal: 40 (06/05/2006)   LDL Goal: 100 (06/05/2006)   TG Goal: 150 (06/05/2006)  Prior 10 Yr Risk Heart Disease: 18 % (01/02/2007)   HDL:36.00 (05/04/2009), 31.60 (12/30/2008)  LDL:57 (05/04/2009), 58 (12/30/2008)  Chol:124 (05/04/2009), 126 (12/30/2008)  Trig:157.0 (05/04/2009), 184.0 (12/30/2008)  Problem # 2:  DIABETES MELLITUS, TYPE II (ICD-250.00) still uncontrolled adjust insulin as below his real issue is related to obesity needs to lose weight---he voices understanding   His updated medication list for this problem includes:    Bayer Childrens Aspirin 81 Mg Chew (Aspirin) .Marland Kitchen... Take 1 tablet by mouth once a day    Glucovance 2.5-500 Mg Tabs (Glyburide-metformin) .Marland Kitchen... Take two tablets two times a day    Humalog Kwikpen 100 Unit/ml Soln (Insulin lispro (human)) ..... Inject as directed--(15am and 25 pmsupper)    Lantus 100 Unit/ml Soln (Insulin glargine) .Marland KitchenMarland KitchenMarland KitchenMarland Kitchen 80units q hs    Lisinopril 40 Mg Tabs (Lisinopril) .Marland Kitchen... 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 Take 1 tablet by mouth once a day  BP today:  138/82 Prior BP: 136/72 (01/07/2009)  Prior 10 Yr Risk Heart Disease: 18 % (01/02/2007)  Labs Reviewed: K+: 4.1 (05/04/2009) Creat: : 0.8 (05/04/2009)   Chol: 124 (05/04/2009)   HDL: 36.00 (05/04/2009)   LDL: 57 (05/04/2009)   TG: 157.0 (05/04/2009)  Complete Medication List: 1)  Astelin 137 Mcg/spray Soln (Azelastine hcl) .... Inhale 1 puff into both nostrils twice a day as needed 2)  Bayer Childrens Aspirin 81 Mg Chew (Aspirin) .... Take 1 tablet by mouth once a day 3)  Glucovance 2.5-500 Mg Tabs (Glyburide-metformin) .... Take two tablets two times a day 4)  Humalog Kwikpen 100 Unit/ml Soln (Insulin lispro (human)) .... Inject as directed--(15am and 25 pmsupper) 5)  Lantus 100 Unit/ml Soln (Insulin glargine) .... 80units q hs 6)  Lisinopril 40 Mg Tabs (Lisinopril) .... Take 1 tablet by mouth once a day 7)  Simvastatin 80 Mg Tabs (Simvastatin) .Marland Kitchen.. 1 po at bedtime 8)  Onetouch Test Strp (Glucose blood) .... Use once daily and as directed 9)  Niacin 500 Mg Tabs (Niacin) .... .Hope Pigeon  Patient Instructions: 1)  Please schedule a follow-up appointment in 4 months. 2)  labs one week prior to visit 3)  lipids---272.4 4)  lfts-995.2  5)  bmet-995.2 6)  A1C-250.02 7)

## 2010-05-17 ENCOUNTER — Other Ambulatory Visit: Payer: Self-pay | Admitting: *Deleted

## 2010-05-17 DIAGNOSIS — E119 Type 2 diabetes mellitus without complications: Secondary | ICD-10-CM

## 2010-05-17 MED ORDER — INSULIN LISPRO 100 UNIT/ML ~~LOC~~ SOLN: SUBCUTANEOUS | Status: DC

## 2010-06-02 ENCOUNTER — Other Ambulatory Visit: Payer: Self-pay

## 2010-06-09 ENCOUNTER — Ambulatory Visit: Payer: Self-pay | Admitting: Internal Medicine

## 2010-06-17 ENCOUNTER — Other Ambulatory Visit: Payer: Self-pay

## 2010-06-20 ENCOUNTER — Other Ambulatory Visit (INDEPENDENT_AMBULATORY_CARE_PROVIDER_SITE_OTHER): Payer: Self-pay

## 2010-06-20 DIAGNOSIS — E785 Hyperlipidemia, unspecified: Secondary | ICD-10-CM

## 2010-06-20 DIAGNOSIS — T887XXA Unspecified adverse effect of drug or medicament, initial encounter: Secondary | ICD-10-CM

## 2010-06-20 DIAGNOSIS — IMO0001 Reserved for inherently not codable concepts without codable children: Secondary | ICD-10-CM

## 2010-06-20 LAB — BASIC METABOLIC PANEL
CO2: 24 mEq/L (ref 19–32)
Chloride: 104 mEq/L (ref 96–112)
Glucose, Bld: 134 mg/dL — ABNORMAL HIGH (ref 70–99)
Potassium: 4.2 mEq/L (ref 3.5–5.1)
Sodium: 136 mEq/L (ref 135–145)

## 2010-06-20 LAB — LIPID PANEL
Cholesterol: 175 mg/dL (ref 0–200)
Total CHOL/HDL Ratio: 5
Triglycerides: 231 mg/dL — ABNORMAL HIGH (ref 0.0–149.0)
VLDL: 46.2 mg/dL — ABNORMAL HIGH (ref 0.0–40.0)

## 2010-06-20 LAB — LDL CHOLESTEROL, DIRECT: Direct LDL: 105.2 mg/dL

## 2010-06-20 LAB — HEPATIC FUNCTION PANEL
Albumin: 3.7 g/dL (ref 3.5–5.2)
Total Bilirubin: 0.5 mg/dL (ref 0.3–1.2)

## 2010-06-22 ENCOUNTER — Encounter: Payer: Self-pay | Admitting: Internal Medicine

## 2010-06-24 ENCOUNTER — Telehealth: Payer: Self-pay | Admitting: Internal Medicine

## 2010-06-24 ENCOUNTER — Ambulatory Visit (INDEPENDENT_AMBULATORY_CARE_PROVIDER_SITE_OTHER): Payer: Self-pay | Admitting: Internal Medicine

## 2010-06-24 ENCOUNTER — Encounter: Payer: Self-pay | Admitting: Internal Medicine

## 2010-06-24 DIAGNOSIS — E785 Hyperlipidemia, unspecified: Secondary | ICD-10-CM

## 2010-06-24 DIAGNOSIS — E119 Type 2 diabetes mellitus without complications: Secondary | ICD-10-CM

## 2010-06-24 DIAGNOSIS — K602 Anal fissure, unspecified: Secondary | ICD-10-CM

## 2010-06-24 DIAGNOSIS — I10 Essential (primary) hypertension: Secondary | ICD-10-CM

## 2010-06-24 MED ORDER — INSULIN LISPRO 100 UNIT/ML ~~LOC~~ SOLN: SUBCUTANEOUS | Status: DC

## 2010-06-24 NOTE — Telephone Encounter (Signed)
Need clarification on Humalog Quick Pen. Need instructions other than use as directed. Pls call asap.

## 2010-06-24 NOTE — Assessment & Plan Note (Signed)
Patient's blood pressure is at the upper limits of where I would want. Blood pressure would clearly improve with weight loss. I have discussed exercise, diet, vigorous weight loss programs. He will consider.

## 2010-06-24 NOTE — Assessment & Plan Note (Signed)
Patient admits that he has not been taking simvastatin as scheduled. I've asked him to be compliant with the simvastatin. I like to get the LDL less than 100. I suspect the triglycerides will respond to weight loss and improvement of diabetes control.

## 2010-06-24 NOTE — Assessment & Plan Note (Addendum)
Not well controlled needs to lose weight. Rare hypoglycemia (maybe once quarterly) Some improvenment in a1c over the past one year He states over the past one month he is taking better care of his blood sugars. He states his morning blood sugars are in the upper 90s in the evening blood sugars are in the 140-160 range. This is much better than previously. For that reason all leads the insulin dose the same for the next 4 months. We'll check labs in 4 months and followup with the patient then.

## 2010-06-24 NOTE — Progress Notes (Signed)
  Subjective:    Patient ID: Zachary Warner, male    DOB: 09/11/1955, 55 y.o.   MRN: [id number]  HPI  patient comes in for followup of multiple medical problems including type 2 diabetes, hyperlipidemia, hypertension. The patient does not check blood sugar or blood pressure at home. The patetient does not follow an exercise or diet program. The patient denies any polyuria, polydipsia.  In the past the patient has gone to diabetic treatment center. The patient is tolerating medications  Without difficulty. The patient does admit to medication compliance.   Past Medical History  Diagnosis Date  . Diabetes mellitus   . Hypertension   . Hyperlipidemia   . Nephrolithiasis    Past Surgical History  Procedure Date  . Hernia repair     reports that he has never smoked. He does not have any smokeless tobacco history on file. He reports that he does not drink alcohol. His drug history not on file. family history includes Coronary artery disease in an unspecified family member; Diabetes in an unspecified family member; Heart disease in his father; and Liver disease in his father. Not on File     Review of Systems  patient denies chest pain, shortness of breath, orthopnea. Denies lower extremity edema, abdominal pain, change in appetite, change in bowel movements. Patient denies rashes, musculoskeletal complaints. No other specific complaints in a complete review of systems.    Objective:   Physical Exam  well-developed well-nourished male in no acute distress. HEENT exam atraumatic, normocephalic, neck supple without jugular venous distention. Chest clear to auscultation cardiac exam S1-S2 are regular. Abdominal exam overweight with bowel sounds, soft and nontender. Extremities no edema. Neurologic exam is alert with a normal gait.        Assessment & Plan:

## 2010-06-24 NOTE — Assessment & Plan Note (Signed)
Resolved

## 2010-06-27 ENCOUNTER — Other Ambulatory Visit: Payer: Self-pay | Admitting: Internal Medicine

## 2010-06-27 MED ORDER — INSULIN LISPRO 100 UNIT/ML ~~LOC~~ SOLN: SUBCUTANEOUS | Status: DC

## 2010-06-27 NOTE — Telephone Encounter (Signed)
Sent in new rx 

## 2010-06-29 ENCOUNTER — Telehealth: Payer: Self-pay | Admitting: *Deleted

## 2010-06-29 NOTE — Telephone Encounter (Signed)
Pt is requesting a new rx for humalog kwikpen. Right now he is taking the vial 15 units in the am and 25 units at supper

## 2010-06-30 NOTE — Telephone Encounter (Signed)
rx called in

## 2010-06-30 NOTE — Telephone Encounter (Signed)
Ok to refill 

## 2010-08-08 ENCOUNTER — Other Ambulatory Visit: Payer: Self-pay | Admitting: *Deleted

## 2010-08-08 MED ORDER — GLYBURIDE-METFORMIN 2.5-500 MG PO TABS: 1.0000 | ORAL_TABLET | Freq: Two times a day (BID) | ORAL | Status: DC

## 2010-08-09 NOTE — Discharge Summary (Signed)
NAME:  Zachary Warner, Zachary Warner                ACCOUNT NO.:  [id number]   MEDICAL RECORD NO.:  [id number]          PATIENT TYPE:  OBV   LOCATION:  2015                         FACILITY:  [facility]   PHYSICIAN:  Ricki Rodriguez, M.D.  DATE OF BIRTH:  Jul 31, 1955   DATE OF ADMISSION:  11/25/2007  DATE OF DISCHARGE:  11/26/2007                               DISCHARGE SUMMARY   REFERRING PHYSICIAN:  Dr. Riley Kill of [facility] [place].   FINAL DIAGNOSES:  1. Chest pain.  2. Diabetes mellitus type 2.  3. Hyperlipidemia.   DISCHARGE MEDICATIONS:  1. Aspirin 81 mg 1 at nighttime.  2. Zocor 40 mg 1 at nighttime.  3. Glucovance 5/500 mg 2 in the morning and 2 in the evening.  4. Avandia 8 mg one in the morning.  5. Lisinopril 10 mg 1 in the morning.  6. Lantus 50 units at nighttime.  7. Humalog with meals as directed and needed.  8. Niacin 250 mg 1 at nighttime x1 week then 2 at nighttime.   FOLLOWUP:  By Dr. Orpah Cobb in 2 weeks.  The patient is to call [phone] for appointment and by primary care physician as arranged.   DISCHARGE DIET:  Low-sodium heart-healthy diet and carbohydrate modified  low-calorie diet.   ACTIVITY:  The patient is to increase activity slowly.   SPECIAL INSTRUCTION:  The patient to stop any activity that causes chest  pain, shortness of breath, dizziness, sweating, or excessive weakness.   HISTORY:  This 55 year old male with a diabetes presented with recurrent  chest pain described as across the chest, dull along with some sharp  component. Sudden movements increased the pain, but also had some nausea  and sweating spell.  The patient has cardiac risk factors of diabetes  and hyperlipidemia.   PHYSICAL EXAMINATION:  VITAL SIGNS:  Temperature 98, pulse 67,  respirations 18, blood pressure 132/77, oxygen saturation 100% on 2 L of  oxygen.  HEENT:  The patient is normocephalic, atraumatic.  He has hazel eyes.  Pupils equally reacting to light.  NECK:   Supple.  LUNGS:  Clear to auscultation bilaterally.  HEART:  Normal S1 and S2.  ABDOMEN:  Soft and nontender.  EXTREMITIES:  No edema, cyanosis, clubbing.  SKIN:  Warm and dry.  NEUROLOGIC:  The patient was alert, oriented x3.  Cranial nerves grossly  intact.   LABORATORY DATA:  Normal hemoglobin, hematocrit, WBC count, platelet  count.  Normal PT/INR.  Normal CK-MB, troponin I.  Near normal  cholesterol level and triglyceride level with low HDL of 32.   EKG normal sinus rhythm.   Nuclear stress test without significant reversible ischemia.   HOSPITAL COURSE:  The patient was admitted to telemetry unit.  Myocardial infarction was ruled out.  He underwent nuclear stress test  that failed to show any reversible ischemia.  His medications were  adjusted and he was discharged home in satisfactory condition with  followup by me in 2 weeks and by primary care physician in 1 month.      Ricki Rodriguez, M.D.  Electronically  Signed     ASK/MEDQ  D:  01/06/2008  T:  01/06/2008  Job:  [id number]

## 2010-08-12 NOTE — Procedures (Signed)
NAME:  Zachary Warner, Zachary Warner NO.:  [id number]   MEDICAL RECORD NO.:  [id number]          PATIENT TYPE:  OUT   LOCATION:  SLEEP CENTER                 FACILITY:  [facility] [place]   PHYSICIAN:  Marcelyn Bruins, M.D. [facility] DATE OF BIRTH:  07-29-1955   DATE OF STUDY:  06/22/2005                              NOCTURNAL POLYSOMNOGRAM   REFERRING PHYSICIAN:  Dr. Birdie Sons.   INDICATIONS FOR THE STUDY:  Hypersomnia with sleep apnea.  The patient  returns for pressure optimization.   EPWORTH SCORE:  13.   SLEEP ARCHITECTURE:  The patient had total sleep time of 361 minutes with  adequate REM but never achieved slow wave sleep.  Sleep onset latency was  mildly prolonged at 35 minutes, and REM onset was normal.  Sleep efficiency  was decreased at 87%.   RESPIRATORY DATA:  A CPAP titration study was ordered, and the patient was  therefore placed on a Respironics medium nasal pillow set up, and CPAP  titration was initiated.  At a final pressure of 10 cm, there was fairly  good control of the patient's obstructive events.  However, toward the end  the study, there was small numbers of breakthrough noted.   OXYGEN DATA:  The patient had O2 desaturation as low as 80% prior to optimal  CPAP.   CARDIAC DATA:  No clinically significant cardiac arrhythmias.   MOVEMENT/PARASOMNIAS:  The patient was found to have 435 leg jerks with 3.5  per hour resulting in arousal or awakening.   IMPRESSION/RECOMMENDATIONS:  1.  Adequate control of previously diagnosed obstructive sleep apnea with 10      cm of CPAP. However, there were a few breakthrough events toward the end      the study.  I would consider treating the patient with 11 cm of water      pressure optimally.  2.  Very large numbers of leg jerks with significant sleep disruption.      These occurred despite fairly good control of the patient's obstructive      sleep apnea.  In the patient is continuing to have symptoms despite CPAP  compliance at an optimal pressure, I would give strong consideration to      a primary movement disorder which is disrupting his sleep.           ______________________________  Marcelyn Bruins, M.D. [facility]  Diplomate, American Board of Sleep  Medicine     KC/MEDQ  D:  07/07/2005 17:28:38  T:  07/08/2005 07:10:41  Job:  [id number]

## 2010-09-09 ENCOUNTER — Other Ambulatory Visit: Payer: Self-pay | Admitting: *Deleted

## 2010-09-09 MED ORDER — INSULIN GLARGINE 100 UNIT/ML ~~LOC~~ SOLN: 48.0000 [IU] | Freq: Two times a day (BID) | SUBCUTANEOUS | Status: DC

## 2010-09-14 ENCOUNTER — Telehealth: Payer: Self-pay | Admitting: Internal Medicine

## 2010-09-14 MED ORDER — INSULIN GLARGINE 100 UNIT/ML ~~LOC~~ SOLN: 48.0000 [IU] | Freq: Two times a day (BID) | SUBCUTANEOUS | Status: DC

## 2010-09-14 NOTE — Telephone Encounter (Signed)
Pharmacy called and said that pts Lantus only has 1 vial, but pt needs a quantity of 3. Need to get auth to get new script for #3. Pls fax to [phone] or call main # [phone]

## 2010-09-14 NOTE — Telephone Encounter (Signed)
rx sent in 

## 2010-10-10 ENCOUNTER — Ambulatory Visit (INDEPENDENT_AMBULATORY_CARE_PROVIDER_SITE_OTHER): Payer: BLUE CROSS/BLUE SHIELD | Admitting: Internal Medicine

## 2010-10-10 ENCOUNTER — Encounter: Payer: Self-pay | Admitting: Internal Medicine

## 2010-10-10 VITALS — BP 130/80 | Temp 98.0°F | Wt 260.0 lb

## 2010-10-10 DIAGNOSIS — M549 Dorsalgia, unspecified: Secondary | ICD-10-CM

## 2010-10-10 LAB — POCT URINALYSIS DIPSTICK
Blood, UA: NEGATIVE
Protein, UA: NEGATIVE
Spec Grav, UA: 1.025
Urobilinogen, UA: 0.2

## 2010-10-10 NOTE — Progress Notes (Signed)
  Subjective:    Patient ID: Zachary Warner, male    DOB: 14-Dec-1955, 55 y.o.   MRN: [id number]  HPI Wt Readings from Last 3 Encounters:  10/10/10 260 lb (117.935 kg)  06/24/10 257 lb (116.574 kg)  02/04/10 255 lb (115.75 kg)   55 year old patient has a history of insulin dependent diabetes. For the past several days she has had discomfort in the right flank area. He states pain is worse during the night and when he first awakes in the morning but he has no difficulties throughout the day. He is active with golf and has no discomfort with  this activity. Pain is not in the back area. No associated symptoms. Pain seems to be alleviated by the supine position but no significant aggravating factors while he has the pain movement or pressure does not seem to intensify the discomfort. No constitutional complaints Review of Systems  Constitutional: Negative for fever, chills, appetite change and fatigue.  HENT: Negative for hearing loss, ear pain, congestion, sore throat, trouble swallowing, neck stiffness, dental problem, voice change and tinnitus.   Eyes: Negative for pain, discharge and visual disturbance.  Respiratory: Negative for cough, chest tightness, wheezing and stridor.   Cardiovascular: Negative for chest pain, palpitations and leg swelling.  Gastrointestinal: Negative for nausea, vomiting, abdominal pain, diarrhea, constipation, blood in stool and abdominal distention.  Genitourinary: Negative for urgency, hematuria, flank pain, discharge, difficulty urinating and genital sores.  Musculoskeletal: Positive for myalgias. Negative for back pain, joint swelling, arthralgias and gait problem.  Skin: Negative for rash.  Neurological: Negative for dizziness, syncope, speech difficulty, weakness, numbness and headaches.  Hematological: Negative for adenopathy. Does not bruise/bleed easily.  Psychiatric/Behavioral: Negative for behavioral problems and dysphoric mood. The patient is not  nervous/anxious.        Objective:   Physical Exam  Constitutional: He appears well-nourished. No distress.       Weight 260 No distress blood pressure 130/80  Abdominal: Soft. Bowel sounds are normal.       Obese soft nontender no organomegaly. No flank tenderness  Musculoskeletal:       Straight leg testing unremarkable;  full range of motion right hip which did not aggravate the pain          Assessment & Plan:   Right flank pain. Suspect musculoligamentous. We'll clinically observe at this time. It was suggested he take Tylenol and bedtime and to try some stretching type exercises. Weight loss encouraged. He is scheduled for followup next month and we'll reassess at that time. He will call if he worsens

## 2010-10-10 NOTE — Patient Instructions (Signed)
Stretching exercises before going to bed Tylenol at bedtime  Call or return to clinic prn if these symptoms worsen or fail to improve as anticipated.

## 2010-10-13 ENCOUNTER — Other Ambulatory Visit: Payer: Self-pay

## 2010-10-24 ENCOUNTER — Ambulatory Visit: Payer: Self-pay | Admitting: Internal Medicine

## 2010-11-10 ENCOUNTER — Other Ambulatory Visit: Payer: Self-pay

## 2010-11-21 ENCOUNTER — Ambulatory Visit: Payer: Self-pay | Admitting: Internal Medicine

## 2010-12-09 ENCOUNTER — Other Ambulatory Visit: Payer: Self-pay | Admitting: Internal Medicine

## 2010-12-09 ENCOUNTER — Other Ambulatory Visit (INDEPENDENT_AMBULATORY_CARE_PROVIDER_SITE_OTHER): Payer: BLUE CROSS/BLUE SHIELD

## 2010-12-09 DIAGNOSIS — I10 Essential (primary) hypertension: Secondary | ICD-10-CM

## 2010-12-09 DIAGNOSIS — E119 Type 2 diabetes mellitus without complications: Secondary | ICD-10-CM

## 2010-12-09 LAB — HEPATIC FUNCTION PANEL
ALT: 27 U/L (ref 0–53)
AST: 22 U/L (ref 0–37)
Bilirubin, Direct: 0 mg/dL (ref 0.0–0.3)
Total Bilirubin: 0.5 mg/dL (ref 0.3–1.2)

## 2010-12-09 LAB — BASIC METABOLIC PANEL
BUN: 16 mg/dL (ref 6–23)
Calcium: 9.1 mg/dL (ref 8.4–10.5)
Creatinine, Ser: 0.9 mg/dL (ref 0.4–1.5)
GFR: 99.34 mL/min (ref >60.00)
Glucose, Bld: 125 mg/dL — ABNORMAL HIGH (ref 70–99)
Sodium: 138 mEq/L (ref 135–145)

## 2010-12-16 ENCOUNTER — Ambulatory Visit (INDEPENDENT_AMBULATORY_CARE_PROVIDER_SITE_OTHER): Payer: BLUE CROSS/BLUE SHIELD | Admitting: Internal Medicine

## 2010-12-16 ENCOUNTER — Encounter: Payer: Self-pay | Admitting: Internal Medicine

## 2010-12-16 DIAGNOSIS — E785 Hyperlipidemia, unspecified: Secondary | ICD-10-CM

## 2010-12-16 DIAGNOSIS — E119 Type 2 diabetes mellitus without complications: Secondary | ICD-10-CM

## 2010-12-16 DIAGNOSIS — I10 Essential (primary) hypertension: Secondary | ICD-10-CM

## 2010-12-16 MED ORDER — METFORMIN HCL 1000 MG PO TABS: 1000.0000 mg | ORAL_TABLET | Freq: Two times a day (BID) | ORAL | Status: DC

## 2010-12-16 MED ORDER — INSULIN LISPRO 100 UNIT/ML ~~LOC~~ SOLN: SUBCUTANEOUS | Status: DC

## 2010-12-16 MED ORDER — ATORVASTATIN CALCIUM 40 MG PO TABS: 40.0000 mg | ORAL_TABLET | Freq: Every day | ORAL | Status: DC

## 2010-12-16 NOTE — Assessment & Plan Note (Signed)
Some improvement.  See new med list Glyburide likely not contributing to better DM control Increase insulin

## 2010-12-16 NOTE — Progress Notes (Signed)
  Subjective:    Patient ID: Zachary Warner, male    DOB: 1955-11-02, 55 y.o.   MRN: [id number]  HPI   patient comes in for followup of multiple medical problems including type 2 diabetes, hyperlipidemia, hypertension. The patient does not check blood sugar or blood pressure at home. The patetient does not follow an exercise or diet program. The patient denies any polyuria, polydipsia.  In the past the patient has gone to diabetic treatment center. The patient is tolerating medications  Without difficulty. The patient does admit to medication compliance--except out of lisinopril  Past Medical History  Diagnosis Date  . Diabetes mellitus   . Hypertension   . Hyperlipidemia   . Nephrolithiasis    Past Surgical History  Procedure Date  . Hernia repair     reports that he has never smoked. He does not have any smokeless tobacco history on file. He reports that he does not drink alcohol. His drug history not on file. family history includes Coronary artery disease in an unspecified family member; Diabetes in an unspecified family member; Heart disease in his father; and Liver disease in his father. No Known Allergies    Review of Systems  patient denies chest pain, shortness of breath, orthopnea. Denies lower extremity edema, abdominal pain, change in appetite, change in bowel movements. Patient denies rashes, musculoskeletal complaints. No other specific complaints in a complete review of systems.      Objective:   Physical Exam  well-developed well-nourished male in no acute distress. HEENT exam atraumatic, normocephalic, neck supple without jugular venous distention. Chest clear to auscultation cardiac exam S1-S2 are regular. Abdominal exam overweight with bowel sounds, soft and nontender. Extremities no edema. Neurologic exam is alert with a normal gait.        Assessment & Plan:

## 2010-12-16 NOTE — Assessment & Plan Note (Signed)
Fair control He wonders if simvastatin related to back pain Lipids not as well controlled Change to atorvastatin

## 2010-12-16 NOTE — Assessment & Plan Note (Signed)
Out of meds for 3 days Resume lisinopril

## 2010-12-28 LAB — CBC
HCT: 40.1
Hemoglobin: 13.4
MCHC: 33.5
MCV: 86.4
Platelets: 196
RDW: 14

## 2010-12-28 LAB — LIPID PANEL
HDL: 32 — ABNORMAL LOW
LDL Cholesterol: 69
Triglycerides: 161 — ABNORMAL HIGH

## 2010-12-28 LAB — GLUCOSE, CAPILLARY: Glucose-Capillary: 219 — ABNORMAL HIGH

## 2011-03-15 ENCOUNTER — Other Ambulatory Visit: Payer: Self-pay | Admitting: *Deleted

## 2011-03-15 MED ORDER — METFORMIN HCL 1000 MG PO TABS: 1000.0000 mg | ORAL_TABLET | Freq: Two times a day (BID) | ORAL | Status: DC

## 2011-03-15 NOTE — Telephone Encounter (Signed)
Make sure he is only taking 1000mg  po bid

## 2011-03-15 NOTE — Telephone Encounter (Signed)
Change in metformin - new sig was 1 bid ,but he was taking 2 bid-- now out of meds and his mail off pharmacy refuses to refill-- will send to [facility] which is free....fyi

## 2011-03-16 NOTE — Telephone Encounter (Signed)
Talked with pt and he is on ly taking 1 bid

## 2011-04-10 ENCOUNTER — Other Ambulatory Visit (INDEPENDENT_AMBULATORY_CARE_PROVIDER_SITE_OTHER): Payer: BLUE CROSS/BLUE SHIELD

## 2011-04-10 DIAGNOSIS — I10 Essential (primary) hypertension: Secondary | ICD-10-CM

## 2011-04-10 DIAGNOSIS — E119 Type 2 diabetes mellitus without complications: Secondary | ICD-10-CM

## 2011-04-10 LAB — HEPATIC FUNCTION PANEL
ALT: 18 U/L (ref 0–53)
AST: 16 U/L (ref 0–37)
Albumin: 3.7 g/dL (ref 3.5–5.2)
Alkaline Phosphatase: 50 U/L (ref 39–117)
Bilirubin, Direct: 0 mg/dL (ref 0.0–0.3)
Total Bilirubin: 0.6 mg/dL (ref 0.3–1.2)
Total Protein: 6.6 g/dL (ref 6.0–8.3)

## 2011-04-10 LAB — LIPID PANEL
HDL: 35.2 mg/dL — ABNORMAL LOW (ref >39.00)
LDL Cholesterol: 65 mg/dL (ref 0–99)
Total CHOL/HDL Ratio: 4
VLDL: 32.4 mg/dL (ref 0.0–40.0)

## 2011-04-10 LAB — BASIC METABOLIC PANEL WITH GFR
BUN: 16 mg/dL (ref 6–23)
CO2: 23 meq/L (ref 19–32)
Calcium: 8.7 mg/dL (ref 8.4–10.5)
Chloride: 98 meq/L (ref 96–112)
Creatinine, Ser: 0.9 mg/dL (ref 0.4–1.5)
GFR: 96.59 mL/min
Glucose, Bld: 238 mg/dL — ABNORMAL HIGH (ref 70–99)
Potassium: 4.2 meq/L (ref 3.5–5.1)
Sodium: 136 meq/L (ref 135–145)

## 2011-04-14 ENCOUNTER — Other Ambulatory Visit: Payer: Self-pay | Admitting: Internal Medicine

## 2011-04-17 ENCOUNTER — Encounter: Payer: Self-pay | Admitting: Internal Medicine

## 2011-04-17 ENCOUNTER — Ambulatory Visit (INDEPENDENT_AMBULATORY_CARE_PROVIDER_SITE_OTHER): Payer: BLUE CROSS/BLUE SHIELD | Admitting: Internal Medicine

## 2011-04-17 VITALS — BP 142/94 | HR 88 | Temp 98.5°F | Ht 70.0 in | Wt 250.0 lb

## 2011-04-17 DIAGNOSIS — E119 Type 2 diabetes mellitus without complications: Secondary | ICD-10-CM

## 2011-04-17 DIAGNOSIS — E785 Hyperlipidemia, unspecified: Secondary | ICD-10-CM

## 2011-04-17 DIAGNOSIS — Z23 Encounter for immunization: Secondary | ICD-10-CM

## 2011-04-17 DIAGNOSIS — I10 Essential (primary) hypertension: Secondary | ICD-10-CM

## 2011-04-17 MED ORDER — INSULIN GLARGINE 100 UNIT/ML ~~LOC~~ SOLN: 50.0000 [IU] | Freq: Two times a day (BID) | SUBCUTANEOUS | Status: DC

## 2011-04-17 MED ORDER — SITAGLIPTIN PHOSPHATE 100 MG PO TABS: 100.0000 mg | ORAL_TABLET | Freq: Every day | ORAL | Status: DC

## 2011-04-17 NOTE — Progress Notes (Signed)
Patient ID: Zachary Warner, male   DOB: January 27, 1956, 56 y.o.   MRN: [id number]  patient comes in for followup of multiple medical problems including type 2 diabetes, hyperlipidemia, hypertension. The patient does not check blood sugar or blood pressure at home. The patetient does not follow an exercise or diet program. The patient denies any polyuria, polydipsia.  In the past the patient has gone to diabetic treatment center. The patient is tolerating medications  Without difficulty. The patient does admit to medication compliance.   Past Medical History  Diagnosis Date  . Diabetes mellitus   . Hypertension   . Hyperlipidemia   . Nephrolithiasis     History   Social History  . Marital Status: Married    Spouse Name: N/A    Number of Children: N/A  . Years of Education: N/A   Occupational History  . Not on file.   Social History Main Topics  . Smoking status: Never Smoker   . Smokeless tobacco: Not on file  . Alcohol Use: No  . Drug Use:   . Sexually Active:    Other Topics Concern  . Not on file   Social History Narrative  . No narrative on file    Past Surgical History  Procedure Date  . Hernia repair     Family History  Problem Relation Age of Onset  . Coronary artery disease      fhx  . Diabetes      fhx  . Heart disease Father   . Liver disease Father     No Known Allergies  Current Outpatient Prescriptions on File Prior to Visit  Medication Sig Dispense Refill  . aspirin 81 MG tablet Take 81 mg by mouth daily.        Marland Kitchen atorvastatin (LIPITOR) 40 MG tablet Take 1 tablet (40 mg total) by mouth daily.  90 tablet  3  . azelastine (ASTELIN) 137 MCG/SPRAY nasal spray 1 spray by Nasal route 2 (two) times daily. Use in each nostril as directed       . glucose blood test strip 1 each by Other route as needed. Use as instructed       . insulin lispro (HUMALOG KWIKPEN) 100 UNIT/ML injection 22 units at breakfast and 35 units at supper  3 mL  12  . LANTUS 100 UNIT/ML  injection INJECT 48 UNITS INTO THE SKIN 2 TIMES DAILY.  30 mL  5  . lisinopril (PRINIVIL,ZESTRIL) 40 MG tablet TAKE 1 TABLET ONCE DAILY  90 tablet  1  . metFORMIN (GLUCOPHAGE) 1000 MG tablet Take 1 tablet (1,000 mg total) by mouth 2 (two) times daily with a meal.  60 tablet  3  . niacin 500 MG tablet Take 500 mg by mouth daily with breakfast.           patient denies chest pain, shortness of breath, orthopnea. Denies lower extremity edema, abdominal pain, change in appetite, change in bowel movements. Patient denies rashes, musculoskeletal complaints. No other specific complaints in a complete review of systems.   BP 142/94  Pulse 88  Temp(Src) 98.5 F (36.9 C) (Oral)  Ht 5\' 10"  (1.778 m)  Wt 250 lb (113.399 kg)  BMI 35.87 kg/m2  well-developed well-nourished male in no acute distress. HEENT exam atraumatic, normocephalic, neck supple without jugular venous distention. Chest clear to auscultation cardiac exam S1-S2 are regular. Abdominal exam overweight with bowel sounds, soft and nontender. Extremities no edema. Neurologic exam is alert with a normal gait.

## 2011-04-17 NOTE — Assessment & Plan Note (Signed)
Fair control Continue same meds 

## 2011-04-17 NOTE — Assessment & Plan Note (Signed)
Would like to address with lifestyle modification Weight loss, Exercise  Low salt

## 2011-04-17 NOTE — Assessment & Plan Note (Signed)
Not as well controlled Increase in A1c likely all related to poor diet (holidays) He needs to lose weight and exercise  Add [place]

## 2011-04-27 ENCOUNTER — Telehealth: Payer: Self-pay | Admitting: *Deleted

## 2011-04-27 NOTE — Telephone Encounter (Signed)
Pt has a recurrent anal fistula, and needs RX for pain, and whatever Dr. Cato Mulligan treated him with the last time he had this,

## 2011-04-28 MED ORDER — HYDROCODONE-ACETAMINOPHEN 5-500 MG PO TABS: 1.0000 | ORAL_TABLET | ORAL | Status: AC | PRN

## 2011-04-28 MED ORDER — NITROGLYCERIN 0.4 % RE OINT: 1.0000 "application " | TOPICAL_OINTMENT | Freq: Two times a day (BID) | RECTAL | Status: DC

## 2011-04-28 NOTE — Telephone Encounter (Signed)
See med list Call patient and inform him

## 2011-04-28 NOTE — Telephone Encounter (Signed)
Notified pt, and his request for pain meds was sent to [facility] with Nitroglycerin oint.

## 2011-06-27 ENCOUNTER — Telehealth: Payer: Self-pay | Admitting: Internal Medicine

## 2011-06-27 MED ORDER — HYDROCODONE-ACETAMINOPHEN 5-500 MG PO TABS: 1.0000 | ORAL_TABLET | ORAL | Status: AC | PRN

## 2011-06-27 NOTE — Telephone Encounter (Signed)
Pt is still having some problems with fischer pain. Pt req refill of  HYDROcodone-acetaminophen (VICODIN) 5-500 MG per tablet to [facility] in [place].

## 2011-06-27 NOTE — Telephone Encounter (Signed)
Pt takes this for a recurrent anal fistula however it was d/c from med list 05/08/11

## 2011-06-27 NOTE — Telephone Encounter (Signed)
rx sent in electronically 

## 2011-06-27 NOTE — Telephone Encounter (Signed)
Ok to refill 1 po q 4 hours, prn rectal pain, #30/1 refill

## 2011-07-20 ENCOUNTER — Other Ambulatory Visit: Payer: Self-pay | Admitting: Internal Medicine

## 2011-07-25 ENCOUNTER — Other Ambulatory Visit: Payer: Self-pay | Admitting: Internal Medicine

## 2011-08-07 ENCOUNTER — Other Ambulatory Visit (INDEPENDENT_AMBULATORY_CARE_PROVIDER_SITE_OTHER): Payer: BLUE CROSS/BLUE SHIELD

## 2011-08-07 DIAGNOSIS — E119 Type 2 diabetes mellitus without complications: Secondary | ICD-10-CM

## 2011-08-07 LAB — HEPATIC FUNCTION PANEL
ALT: 17 U/L (ref 0–53)
Bilirubin, Direct: 0 mg/dL (ref 0.0–0.3)
Total Bilirubin: 0.7 mg/dL (ref 0.3–1.2)

## 2011-08-07 LAB — BASIC METABOLIC PANEL
BUN: 14 mg/dL (ref 6–23)
CO2: 23 mEq/L (ref 19–32)
Chloride: 98 mEq/L (ref 96–112)
GFR: 112.77 mL/min (ref >60.00)
Glucose, Bld: 127 mg/dL — ABNORMAL HIGH (ref 70–99)
Potassium: 4.3 mEq/L (ref 3.5–5.1)
Sodium: 137 mEq/L (ref 135–145)

## 2011-08-07 LAB — LIPID PANEL
HDL: 42.7 mg/dL (ref >39.00)
Triglycerides: 282 mg/dL — ABNORMAL HIGH (ref 0.0–149.0)
VLDL: 56.4 mg/dL — ABNORMAL HIGH (ref 0.0–40.0)

## 2011-08-07 LAB — LDL CHOLESTEROL, DIRECT: Direct LDL: 130.9 mg/dL

## 2011-08-14 ENCOUNTER — Ambulatory Visit: Payer: BLUE CROSS/BLUE SHIELD | Admitting: Internal Medicine

## 2011-08-18 ENCOUNTER — Ambulatory Visit: Payer: BLUE CROSS/BLUE SHIELD | Admitting: Internal Medicine

## 2011-08-25 ENCOUNTER — Ambulatory Visit (INDEPENDENT_AMBULATORY_CARE_PROVIDER_SITE_OTHER): Payer: BLUE CROSS/BLUE SHIELD | Admitting: Internal Medicine

## 2011-08-25 ENCOUNTER — Encounter: Payer: Self-pay | Admitting: Internal Medicine

## 2011-08-25 VITALS — BP 135/82 | HR 98 | Temp 98.4°F | Wt 252.0 lb

## 2011-08-25 DIAGNOSIS — E119 Type 2 diabetes mellitus without complications: Secondary | ICD-10-CM

## 2011-08-25 DIAGNOSIS — I10 Essential (primary) hypertension: Secondary | ICD-10-CM

## 2011-08-25 DIAGNOSIS — E785 Hyperlipidemia, unspecified: Secondary | ICD-10-CM

## 2011-08-25 NOTE — Progress Notes (Signed)
Patient ID: Zachary Warner, male   DOB: [birth year], 56 y.o.   MRN: [id number]   patient comes in for followup of multiple medical problems including type 2 diabetes, hyperlipidemia, hypertension. The patient does not check blood sugar or blood pressure at home. The patetient does not follow an exercise  program. The patient denies any polyuria, polydipsia.  In the past the patient has gone to diabetic treatment center. The patient is tolerating medications  Without difficulty. The patient does admit to medication compliance.   Trying to follow a better diet  Past Medical History  Diagnosis Date  . Diabetes mellitus   . Hypertension   . Hyperlipidemia   . Nephrolithiasis     History   Social History  . Marital Status: Married    Spouse Name: N/A    Number of Children: N/A  . Years of Education: N/A   Occupational History  . Not on file.   Social History Main Topics  . Smoking status: Never Smoker   . Smokeless tobacco: Not on file  . Alcohol Use: No  . Drug Use:   . Sexually Active:    Other Topics Concern  . Not on file   Social History Narrative  . No narrative on file    Past Surgical History  Procedure Date  . Hernia repair     Family History  Problem Relation Age of Onset  . Coronary artery disease      fhx  . Diabetes      fhx  . Heart disease Father   . Liver disease Father     No Known Allergies  Current Outpatient Prescriptions on File Prior to Visit  Medication Sig Dispense Refill  . aspirin 81 MG tablet Take 81 mg by mouth daily.        Marland Kitchen atorvastatin (LIPITOR) 40 MG tablet Take 1 tablet (40 mg total) by mouth daily.  90 tablet  3  . azelastine (ASTELIN) 137 MCG/SPRAY nasal spray 1 spray by Nasal route 2 (two) times daily. Use in each nostril as directed       . glucose blood test strip 1 each by Other route as needed. Use as instructed       . insulin glargine (LANTUS) 100 UNIT/ML injection Inject 50 Units into the skin 2 (two) times daily.  50  mL  11  . lisinopril (PRINIVIL,ZESTRIL) 40 MG tablet TAKE 1 TABLET ONCE DAILY  90 tablet  1  . metFORMIN (GLUCOPHAGE) 1000 MG tablet Take 1 tablet (1,000 mg total) by mouth 2 (two) times daily with a meal.  60 tablet  3  . niacin 500 MG tablet Take 500 mg by mouth daily with breakfast.        . Nitroglycerin 0.4 % OINT Place 1 application rectally 2 (two) times daily.  30 g  PRN  . sitaGLIPtin (JANUVIA) 100 MG tablet Take 1 tablet (100 mg total) by mouth daily.  90 tablet  3  . DISCONTD: HUMALOG KWIKPEN 100 UNIT/ML injection INJECT 15 UNITS SUBQUE AT BREAKFAST AND 25 UNITS AT SUPPER.  15 mL  5     patient denies chest pain, shortness of breath, orthopnea. Denies lower extremity edema, abdominal pain, change in appetite, change in bowel movements. Patient denies rashes, musculoskeletal complaints. No other specific complaints in a complete review of systems.   BP 166/94  Pulse 98  Temp(Src) 98.4 F (36.9 C) (Oral)  Wt 252 lb (114.306 kg)  well-developed well-nourished male in no acute  distress. HEENT exam atraumatic, normocephalic, neck supple without jugular venous distention. Chest clear to auscultation cardiac exam S1-S2 are regular. Abdominal exam overweight with bowel sounds, soft and nontender. Extremities no edema. Neurologic exam is alert with a normal gait.

## 2011-08-28 NOTE — Assessment & Plan Note (Signed)
A1c is better controlled than previously. It is still not adequately controlled. Discussed the need for aggressive weight loss. Reviewed medications with the patient. Discussed the need for daily exercise at least 30 minutes.

## 2011-08-28 NOTE — Assessment & Plan Note (Signed)
Not adequately controlled. Discussed medications. Discussed compliance. Will recheck in 6 months.

## 2011-08-28 NOTE — Assessment & Plan Note (Signed)
BP Readings from Last 3 Encounters:  08/25/11 135/82  04/17/11 142/94  12/16/10 156/94   Reasonable control. Continue current medications. Patient will benefit from weight loss.

## 2011-09-05 ENCOUNTER — Other Ambulatory Visit: Payer: Self-pay | Admitting: Internal Medicine

## 2011-12-15 ENCOUNTER — Telehealth: Payer: Self-pay | Admitting: Internal Medicine

## 2011-12-15 NOTE — Telephone Encounter (Signed)
Pt called req to get an order for new cpap head gear with nasal pillows. Pls call in [facility] on [address]. Pt would like to be notified when done.

## 2011-12-20 NOTE — Telephone Encounter (Signed)
Order faxed, pt aware

## 2011-12-28 ENCOUNTER — Emergency Department (HOSPITAL_COMMUNITY): Payer: BC Managed Care – PPO

## 2011-12-28 ENCOUNTER — Emergency Department (HOSPITAL_COMMUNITY)
Admission: EM | Admit: 2011-12-28 | Discharge: 2011-12-28 | Disposition: A | Discharge status: Home / Self Care | Payer: BC Managed Care – PPO | Attending: Emergency Medicine | Admitting: Emergency Medicine

## 2011-12-28 ENCOUNTER — Encounter (HOSPITAL_COMMUNITY): Payer: Self-pay | Admitting: *Deleted

## 2011-12-28 ENCOUNTER — Telehealth: Payer: Self-pay | Admitting: Internal Medicine

## 2011-12-28 DIAGNOSIS — Z7982 Long term (current) use of aspirin: Secondary | ICD-10-CM | POA: Insufficient documentation

## 2011-12-28 DIAGNOSIS — H538 Other visual disturbances: Secondary | ICD-10-CM | POA: Insufficient documentation

## 2011-12-28 DIAGNOSIS — Z794 Long term (current) use of insulin: Secondary | ICD-10-CM | POA: Insufficient documentation

## 2011-12-28 DIAGNOSIS — I10 Essential (primary) hypertension: Secondary | ICD-10-CM | POA: Insufficient documentation

## 2011-12-28 DIAGNOSIS — Z79899 Other long term (current) drug therapy: Secondary | ICD-10-CM | POA: Insufficient documentation

## 2011-12-28 DIAGNOSIS — E119 Type 2 diabetes mellitus without complications: Secondary | ICD-10-CM | POA: Insufficient documentation

## 2011-12-28 DIAGNOSIS — H532 Diplopia: Secondary | ICD-10-CM

## 2011-12-28 DIAGNOSIS — R110 Nausea: Secondary | ICD-10-CM | POA: Insufficient documentation

## 2011-12-28 DIAGNOSIS — R51 Headache: Secondary | ICD-10-CM | POA: Insufficient documentation

## 2011-12-28 DIAGNOSIS — E785 Hyperlipidemia, unspecified: Secondary | ICD-10-CM | POA: Insufficient documentation

## 2011-12-28 LAB — CBC WITH DIFFERENTIAL/PLATELET
Basophils Absolute: 0 10*3/uL (ref 0.0–0.1)
HCT: 42.8 % (ref 39.0–52.0)
Lymphocytes Relative: 24 % (ref 12–46)
Lymphs Abs: 1.6 10*3/uL (ref 0.7–4.0)
Monocytes Absolute: 0.4 10*3/uL (ref 0.1–1.0)
Neutro Abs: 4.6 10*3/uL (ref 1.7–7.7)
Platelets: 218 10*3/uL (ref 150–400)
RBC: 5.15 MIL/uL (ref 4.22–5.81)
RDW: 13.2 % (ref 11.5–15.5)
WBC: 6.7 10*3/uL (ref 4.0–10.5)

## 2011-12-28 LAB — BASIC METABOLIC PANEL
CO2: 23 mEq/L (ref 19–32)
Chloride: 103 mEq/L (ref 96–112)
Glucose, Bld: 263 mg/dL — ABNORMAL HIGH (ref 70–99)
Sodium: 136 mEq/L (ref 135–145)

## 2011-12-28 NOTE — ED Notes (Signed)
Pt. Has c/o Left sided HA and c/o Blurred and double vision on the left side field of vision.  Pt. Reports being able to see "just as clear as day on the right and double and blurred when he looks to the left.

## 2011-12-28 NOTE — ED Provider Notes (Signed)
History     CSN: [id number]  Arrival date & time 12/28/11  4098   First MD Initiated Contact with Patient 12/28/11 1008      Chief Complaint  Patient presents with  . Headache  . Nausea  . Blurred Vision  . Diplopia    (Consider location/radiation/quality/duration/timing/severity/associated sxs/prior treatment) HPI Comments: Patients with history of hypertension, hyperlipidemia, diabetes - presents with complaint of diplopia and blurry vision in left eye only that started 5 days ago. Patient has significant diplopia with leftward gaze this morning and came to the emergency department for evaluation. Patient denies stroke symptoms including numbness, weakness, tingling of his arm or legs. No slurred speech or facial droop per wife. Patient has never had any stroke symptoms before. Patient has had vague, mild, left-sided headache as well. No fever, URI symptoms, chest pain, shortness of breath, abdominal pain, nausea, vomiting, diarrhea or urinary changes. Onset gradual. Course is constant.  The history is provided by the patient.    Past Medical History  Diagnosis Date  . Diabetes mellitus   . Hypertension   . Hyperlipidemia   . Nephrolithiasis     Past Surgical History  Procedure Date  . Hernia repair     Family History  Problem Relation Age of Onset  . Coronary artery disease      fhx  . Diabetes      fhx  . Heart disease Father   . Liver disease Father     History  Substance Use Topics  . Smoking status: Never Smoker   . Smokeless tobacco: Not on file  . Alcohol Use: No      Review of Systems  Constitutional: Negative for fever and chills.  HENT: Positive for sinus pressure. Negative for congestion, sore throat, rhinorrhea, neck pain and neck stiffness.   Eyes: Positive for visual disturbance. Negative for photophobia, discharge and redness.  Respiratory: Negative for cough.   Cardiovascular: Negative for chest pain.  Gastrointestinal: Negative for  nausea, vomiting, abdominal pain and diarrhea.  Genitourinary: Negative for dysuria.  Musculoskeletal: Negative for myalgias.  Skin: Negative for rash.  Neurological: Negative for facial asymmetry, speech difficulty, weakness, light-headedness, numbness and headaches.    Allergies  Review of patient's allergies indicates no known allergies.  Home Medications   Current Outpatient Rx  Name Route Sig Dispense Refill  . ASPIRIN 81 MG PO TABS Oral Take 81 mg by mouth daily.      . AZELASTINE HCL 137 MCG/SPRAY NA SOLN Nasal 1 spray by Nasal route 2 (two) times daily. Use in each nostril as directed     . HYDROCODONE-ACETAMINOPHEN 5-500 MG PO TABS  TAKE ONE TABLET EVERY 4 HOURS AS NEEDED FOR RECTAL PAIN 30 tablet 0  . INSULIN GLARGINE 100 UNIT/ML [place] SOLN Subcutaneous Inject 50 Units into the skin 2 (two) times daily. 50 mL 11  . INSULIN LISPRO (HUMAN) 100 UNIT/ML  SOLN Subcutaneous Inject 40 Units into the skin at bedtime.     Marland Kitchen LISINOPRIL 40 MG PO TABS  TAKE 1 TABLET ONCE DAILY 90 tablet 1  . METFORMIN HCL 1000 MG PO TABS Oral Take 1 tablet (1,000 mg total) by mouth 2 (two) times daily with a meal. 60 tablet 3  . NIACIN 500 MG PO TABS Oral Take 500 mg by mouth at bedtime.     Marland Kitchen SITAGLIPTIN PHOSPHATE 100 MG PO TABS Oral Take 1 tablet (100 mg total) by mouth daily. 90 tablet 3  . ATORVASTATIN CALCIUM 40 MG PO  TABS Oral Take 40 mg by mouth daily.    Marland Kitchen GLUCOSE BLOOD VI STRP Other 1 each by Other route as needed. Use as instructed       BP 186/92  Pulse 74  Temp 97.1 F (36.2 C) (Oral)  Resp 20  SpO2 97%  Physical Exam  Nursing note and vitals reviewed. Constitutional: He is oriented to person, place, and time. He appears well-developed and well-nourished.  HENT:  Head: Normocephalic and atraumatic.  Right Ear: Tympanic membrane, external ear and ear canal normal.  Left Ear: Tympanic membrane, external ear and ear canal normal.  Nose: Nose normal.  Mouth/Throat: Uvula is midline,  oropharynx is clear and moist and mucous membranes are normal.  Eyes: Conjunctivae normal are normal. Pupils are equal, round, and reactive to light. Right eye exhibits no discharge. Left eye exhibits no discharge.       No visual field cuts detected with testing.  Neck: Normal range of motion. Neck supple.       No carotid bruits heard bilaterally.  Cardiovascular: Normal rate, regular rhythm and normal heart sounds.   No murmur heard. Pulmonary/Chest: Effort normal and breath sounds normal.  Abdominal: Soft. There is no tenderness.  Neurological: He is alert and oriented to person, place, and time. He has normal strength. No cranial nerve deficit or sensory deficit. He displays a negative Romberg sign. Coordination and gait normal.  Skin: Skin is warm and dry.  Psychiatric: He has a normal mood and affect.    ED Course  Procedures (including critical care time)  Labs Reviewed  BASIC METABOLIC PANEL - Abnormal; Notable for the following:    Glucose, Bld 263 (*)     All other components within normal limits  CBC WITH DIFFERENTIAL   Ct Head Wo Contrast  12/28/2011  *RADIOLOGY REPORT*  Clinical Data: Diplopia.  CT HEAD WITHOUT CONTRAST  Technique:  Contiguous axial images were obtained from the base of the skull through the vertex without contrast.  Comparison: None.  Findings: Bony calvarium appears intact.  No mass effect or midline shift is noted.  Ventricular size is within normal limits.  There is no evidence of mass lesion, hemorrhage or infarction.  IMPRESSION: No gross intracranial abnormality seen.   Original Report Authenticated By: Venita Sheffield., M.D.      1. Diplopia     10:22 AM Patient seen and examined. D/w Dr. Estell Harpin. Work-up initiated.    Vital signs reviewed and are as follows: Filed Vitals:   12/28/11 0956  BP: 186/92  Pulse: 74  Temp: 97.1 F (36.2 C)  Resp: 20   12:45 PM Patient seen by and discussed with Dr. Estell Harpin.   Neurology called and asked to  consult.   Neurologist detected slight deficit with movement of eye. Recc MR orbit, head to rule out structural problem or small stroke. Ordered.   3:11 PM MRI results reviewed by myself and Dr. Estell Harpin. I spoke with neurologist who reviewed films. Freddy Finner is old. PCP follow-up for aggressive risk factor management.   Patient urged to follow-up, return with worsening symptoms, stroke symptoms. Patient and wife verbalize understanding and agree with plan.     MDM  Diplopia -- MRI neg for acute stroke. 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 PCP f/u for risk factor management.   HTN/hyperglycemia -- continue home meds, PCP follow-up.         Renne Crigler, [place] 12/28/11 1515

## 2011-12-28 NOTE — ED Notes (Signed)
Pt back from scan 

## 2011-12-28 NOTE — Telephone Encounter (Signed)
Caller: Duc/Patient; Patient Name: Zachary Warner; PCP: Birdie Sons (Adults only); Best Callback Phone Number: [phone], headache and intermittent blurry vision onset 12/23/11, had double vision briefly this am 10/3, not checked blood sugar this am, currently vision fine, near [facility] in [place] Change in Vision Protocol Utilized.  May be seen now at [facility].

## 2011-12-28 NOTE — ED Notes (Signed)
Patient transported to MRI 

## 2011-12-28 NOTE — ED Notes (Signed)
MD at bedside. 

## 2011-12-28 NOTE — Consult Note (Signed)
[facility] CONSULT NOTE     Reason for Consult: diplopia on far left gaze    HPI:    Zachary Warner is an 56 y.o. male who has notice double vision over the past 6 days when he would look to the far left.  This would correct itself when he would look straight forward or to the right.  He did not have diplopia when he would cover the right or left eye.  Only with binocular vision and looking to the left was diplopia noted.  HE also states he has had a sensation of pressure and slight HA behind his left eye. His previous A1c was 9.2, 11.2, 9.0, 9.8 and 9.9.  No other symptoms of weakness, dysarthria, vertigo, numbness or paresthesia.   Past Medical History  Diagnosis Date  . Diabetes mellitus   . Hypertension   . Hyperlipidemia   . Nephrolithiasis     Past Surgical History  Procedure Date  . Hernia repair     Family History  Problem Relation Age of Onset  . Coronary artery disease      fhx  . Diabetes      fhx  . Heart disease Father   . Liver disease Father     Social History:  reports that he has never smoked. He does not have any smokeless tobacco history on file. He reports that he does not drink alcohol or use illicit drugs.  No Known Allergies  Medications:    No current facility-administered medications for this encounter.   Current Outpatient Prescriptions  Medication Sig Dispense Refill  . aspirin 81 MG tablet Take 81 mg by mouth daily.        Marland Kitchen azelastine (ASTELIN) 137 MCG/SPRAY nasal spray 1 spray by Nasal route 2 (two) times daily. Use in each nostril as directed       . HYDROcodone-acetaminophen (VICODIN) 5-500 MG per tablet TAKE ONE TABLET EVERY 4 HOURS AS NEEDED FOR RECTAL PAIN  30 tablet  0  . insulin glargine (LANTUS) 100 UNIT/ML injection Inject 50 Units into the skin 2 (two) times daily.  50 mL  11  . insulin lispro (HUMALOG KWIKPEN) 100 UNIT/ML injection Inject 40 Units into the skin at bedtime.       Marland Kitchen lisinopril  (PRINIVIL,ZESTRIL) 40 MG tablet TAKE 1 TABLET ONCE DAILY  90 tablet  1  . metFORMIN (GLUCOPHAGE) 1000 MG tablet Take 1 tablet (1,000 mg total) by mouth 2 (two) times daily with a meal.  60 tablet  3  . niacin 500 MG tablet Take 500 mg by mouth at bedtime.       . sitaGLIPtin (JANUVIA) 100 MG tablet Take 1 tablet (100 mg total) by mouth daily.  90 tablet  3  . atorvastatin (LIPITOR) 40 MG tablet Take 40 mg by mouth daily.      Marland Kitchen glucose blood test strip 1 each by Other route as needed. Use as instructed         Review of Systems - General ROS: negative for - chills, fatigue, fever or hot flashes Hematological and Lymphatic ROS: negative for - bruising, fatigue, jaundice or pallor Endocrine ROS: negative for - hair pattern changes, hot flashes, mood swings or skin changes Respiratory ROS: negative for - cough, hemoptysis, orthopnea or wheezing Cardiovascular ROS: negative for - dyspnea on exertion, orthopnea, palpitations or shortness of breath  Gastrointestinal ROS: negative for - abdominal pain, appetite loss, blood in stools, diarrhea or hematemesis Musculoskeletal ROS: negative for - joint pain, joint stiffness, joint swelling or muscle pain Neurological ROS: positive for - diplopia, HA Dermatological ROS: negative for dry skin, pruritus and rash   Blood pressure 172/83, pulse 84, temperature 97.1 F (36.2 C), temperature source Oral, resp. rate 18, SpO2 96.00%.   Neurologic Examination:  Mental Status: Alert, oriented, thought content appropriate.  Speech fluent without evidence of aphasia.  Able to follow 3 step commands without difficulty. Cranial Nerves: II: Discs difficult to visualize bilaterally; Visual fields grossly normal, pupils equal, round, reactive to light and accommodation III,IV, VI: ptosis not present, extra-ocular motions intact --Patient shows slight a LEFT ABduction palsy when looking to the left.  V,VII: smile symmetric, facial light touch sensation normal  bilaterally VIII: hearing normal bilaterally IX,X: Uvula elevates symmetrically XI: bilateral shoulder shrug XII: midline tongue extension Motor: Right : Upper extremity   5/5    Left:     Upper extremity   5/5  Lower extremity   5/5     Lower extremity   5/5 Tone and bulk:normal tone throughout; no atrophy noted Sensory: Pinprick and light touch intact throughout, bilaterally Deep Tendon Reflexes: 2+ and symmetric throughout Plantars: Right: downgoing   Left: downgoing Cerebellar: normal finger-to-nose,  normal heel-to-shin test Gait: Normal gait, heel to shin, tip toe and walking on heels.  CV: pulses palpable throughout      Lab Results  Component Value Date/Time   CHOL 220* 08/07/2011  8:45 AM    Results for orders placed during the hospital encounter of 12/28/11 (from the past 48 hour(s))  CBC WITH DIFFERENTIAL     Status: Normal   Collection Time   12/28/11 10:25 AM      Component Value Range Comment   WBC 6.7  4.0 - 10.5 K/uL    RBC 5.15  4.22 - 5.81 MIL/uL    Hemoglobin 14.9  13.0 - 17.0 g/dL    HCT 40.9  81.1 - 91.4 %    MCV 83.1  78.0 - 100.0 fL    MCH 28.9  26.0 - 34.0 pg    MCHC 34.8  30.0 - 36.0 g/dL    RDW 78.2  95.6 - 21.3 %    Platelets 218  150 - 400 K/uL    Neutrophils Relative 68  43 - 77 %    Neutro Abs 4.6  1.7 - 7.7 K/uL    Lymphocytes Relative 24  12 - 46 %    Lymphs Abs 1.6  0.7 - 4.0 K/uL    Monocytes Relative 6  3 - 12 %    Monocytes Absolute 0.4  0.1 - 1.0 K/uL    Eosinophils Relative 2  0 - 5 %    Eosinophils Absolute 0.1  0.0 - 0.7 K/uL    Basophils Relative 0  0 - 1 %    Basophils Absolute 0.0  0.0 - 0.1 K/uL   BASIC METABOLIC PANEL     Status: Abnormal   Collection Time   12/28/11 10:25 AM      Component Value Range Comment   Sodium 136  135 - 145 mEq/L    Potassium 4.5  3.5 - 5.1 mEq/L    Chloride 103  96 - 112 mEq/L    CO2 23  19 - 32 mEq/L    Glucose, Bld 263 (*) 70 - 99 mg/dL    BUN 14  6 -  23 mg/dL    Creatinine, Ser 4.54   0.50 - 1.35 mg/dL    Calcium 9.6  8.4 - 09.8 mg/dL    GFR calc non Af Amer >90  >90 mL/min    GFR calc Af Amer >90  >90 mL/min     Ct Head Wo Contrast  12/28/2011  *RADIOLOGY REPORT*  Clinical Data: Diplopia.  CT HEAD WITHOUT CONTRAST  Technique:  Contiguous axial images were obtained from the base of the skull through the vertex without contrast.  Comparison: None.  Findings: Bony calvarium appears intact.  No mass effect or midline shift is noted.  Ventricular size is within normal limits.  There is no evidence of mass lesion, hemorrhage or infarction.  IMPRESSION: No gross intracranial abnormality seen.   Original Report Authenticated By: Venita Sheffield., M.D.      Assessment/Plan:   56 YO male with HTN, hyperlipidemia, and uncontrolled DM (recient A1c 9.2) who presents with binocular diplopia on far left gaze and partial left eye Abduction palsy.  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  Although less likely, cannot rule out orbital mass producing palsy given left facial pain and pressure.   Recommend:  1) MRI brain w/o contrast 2) MRI orbits with contrast 3) Blood glucose control  If MRI brain and orbits normal, would not recommend further neurological workup.     Felicie Morn PA-C [facility] [phone]  12/28/2011, 12:45 PM

## 2011-12-28 NOTE — ED Notes (Signed)
Family at bedside. 

## 2011-12-30 NOTE — ED Provider Notes (Signed)
Medical screening examination/treatment/procedure(s) were conducted as a shared visit with non-physician practitioner(s) and myself.  I personally evaluated the patient during the encounter Pt with double vision.  pe mild palsy noticed on left eye lateral gauze  Benny Lennert, MD 12/30/11 [phone]

## 2012-01-12 ENCOUNTER — Other Ambulatory Visit: Payer: Self-pay | Admitting: Internal Medicine

## 2012-01-22 ENCOUNTER — Other Ambulatory Visit: Payer: Self-pay | Admitting: Internal Medicine

## 2012-01-26 LAB — HM DIABETES FOOT EXAM

## 2012-01-26 LAB — HM DIABETES EYE EXAM

## 2012-02-15 ENCOUNTER — Other Ambulatory Visit (INDEPENDENT_AMBULATORY_CARE_PROVIDER_SITE_OTHER): Payer: BC Managed Care – PPO

## 2012-02-15 DIAGNOSIS — E119 Type 2 diabetes mellitus without complications: Secondary | ICD-10-CM

## 2012-02-15 LAB — BASIC METABOLIC PANEL
BUN: 18 mg/dL (ref 6–23)
Chloride: 104 mEq/L (ref 96–112)
Creatinine, Ser: 0.9 mg/dL (ref 0.4–1.5)
Glucose, Bld: 226 mg/dL — ABNORMAL HIGH (ref 70–99)
Potassium: 4.1 mEq/L (ref 3.5–5.1)

## 2012-02-15 LAB — LIPID PANEL
HDL: 29.1 mg/dL — ABNORMAL LOW (ref >39.00)
LDL Cholesterol: 42 mg/dL (ref 0–99)
Total CHOL/HDL Ratio: 4
VLDL: 31.8 mg/dL (ref 0.0–40.0)

## 2012-02-15 LAB — HEPATIC FUNCTION PANEL: Total Bilirubin: 0.7 mg/dL (ref 0.3–1.2)

## 2012-02-17 ENCOUNTER — Other Ambulatory Visit: Payer: Self-pay | Admitting: Internal Medicine

## 2012-02-26 ENCOUNTER — Encounter: Payer: Self-pay | Admitting: Internal Medicine

## 2012-02-26 ENCOUNTER — Ambulatory Visit (INDEPENDENT_AMBULATORY_CARE_PROVIDER_SITE_OTHER): Payer: BC Managed Care – PPO | Admitting: Internal Medicine

## 2012-02-26 VITALS — BP 142/90 | HR 90 | Temp 98.2°F | Wt 248.0 lb

## 2012-02-26 DIAGNOSIS — E119 Type 2 diabetes mellitus without complications: Secondary | ICD-10-CM

## 2012-02-26 DIAGNOSIS — I10 Essential (primary) hypertension: Secondary | ICD-10-CM

## 2012-02-26 DIAGNOSIS — H4920 Sixth [abducent] nerve palsy, unspecified eye: Secondary | ICD-10-CM

## 2012-02-26 DIAGNOSIS — E785 Hyperlipidemia, unspecified: Secondary | ICD-10-CM

## 2012-02-26 HISTORY — DX: Sixth (abducent) nerve palsy, unspecified eye: H49.20

## 2012-02-26 MED ORDER — INSULIN GLARGINE 100 UNIT/ML ~~LOC~~ SOLN: 60.0000 [IU] | Freq: Two times a day (BID) | SUBCUTANEOUS | Status: DC

## 2012-02-26 MED ORDER — INSULIN LISPRO 100 UNIT/ML ~~LOC~~ SOLN: SUBCUTANEOUS | Status: DC

## 2012-02-26 NOTE — Assessment & Plan Note (Signed)
Lab Results  Component Value Date   HGBA1C 10.6* 02/15/2012   Not controlled- increase meds

## 2012-02-26 NOTE — Assessment & Plan Note (Signed)
Controlled; continue meds 

## 2012-02-26 NOTE — Progress Notes (Signed)
Patient ID: Zachary Warner, male   DOB: 01-13-1956, 56 y.o.   MRN: [id number]  cpx  Past Medical History  Diagnosis Date  . Diabetes mellitus   . Hypertension   . Hyperlipidemia   . Nephrolithiasis     History   Social History  . Marital Status: Married    Spouse Name: N/A    Number of Children: N/A  . Years of Education: N/A   Occupational History  . Not on file.   Social History Main Topics  . Smoking status: Never Smoker   . Smokeless tobacco: Not on file  . Alcohol Use: No  . Drug Use: No  . Sexually Active: No   Other Topics Concern  . Not on file   Social History Narrative  . No narrative on file    Past Surgical History  Procedure Date  . Hernia repair     Family History  Problem Relation Age of Onset  . Coronary artery disease      fhx  . Diabetes      fhx  . Heart disease Father   . Liver disease Father     No Known Allergies  Current Outpatient Prescriptions on File Prior to Visit  Medication Sig Dispense Refill  . aspirin 81 MG tablet Take 81 mg by mouth daily.        Marland Kitchen atorvastatin (LIPITOR) 40 MG tablet TAKE 1 TABLET ONCE DAILY  90 tablet  1  . azelastine (ASTELIN) 137 MCG/SPRAY nasal spray 1 spray by Nasal route 2 (two) times daily. Use in each nostril as directed       . glucose blood test strip 1 each by Other route as needed. Use as instructed       . insulin glargine (LANTUS) 100 UNIT/ML injection Inject 50 Units into the skin 2 (two) times daily.  50 mL  11  . insulin lispro (HUMALOG KWIKPEN) 100 UNIT/ML injection Inject into the skin. Inject 18 units at breakfast and lunch and 40 units at dinner      . lisinopril (PRINIVIL,ZESTRIL) 40 MG tablet TAKE 1 TABLET ONCE DAILY  90 tablet  1  . metFORMIN (GLUCOPHAGE) 1000 MG tablet TAKE 1 TABLET TWICE DAILY WITH MEALS.  180 tablet  1  . niacin 500 MG tablet Take 500 mg by mouth at bedtime.       . sitaGLIPtin (JANUVIA) 100 MG tablet Take 1 tablet (100 mg total) by mouth daily.  90 tablet  3      patient denies chest pain, shortness of breath, orthopnea. Denies lower extremity edema, abdominal pain, change in appetite, change in bowel movements. Patient denies rashes, musculoskeletal complaints. No other specific complaints in a complete review of systems.   BP 142/90  Pulse 90  Temp 98.2 F (36.8 C) (Oral)  Wt 248 lb (112.492 kg)  Well-developed male in no acute distress. HEENT exam atraumatic, normocephalic, extraocular muscles are intact. Conjunctivae are pink without exudate. Neck is supple without lymphadenopathy, thyromegaly, jugular venous distention. Chest is clear to auscultation without increased work of breathing. Cardiac exam S1-S2 are regular. The PMI is normal. No significant murmurs or gallops. Abdominal exam active bowel sounds, soft, nontender. No abdominal bruits. Extremities no clubbing cyanosis or edema. Peripheral pulses are normal without bruits. Neurologic exam alert and oriented without any motor or sensory deficits. Rectal exam normal tone prostate normal size without masses or asymmetry.

## 2012-02-26 NOTE — Assessment & Plan Note (Signed)
resolved 

## 2012-02-26 NOTE — Patient Instructions (Addendum)
r 

## 2012-02-26 NOTE — Assessment & Plan Note (Signed)
Fair control-- weight loss is key

## 2012-04-20 ENCOUNTER — Other Ambulatory Visit: Payer: Self-pay | Admitting: Internal Medicine

## 2012-06-18 ENCOUNTER — Other Ambulatory Visit: Payer: Self-pay | Admitting: *Deleted

## 2012-06-18 DIAGNOSIS — E119 Type 2 diabetes mellitus without complications: Secondary | ICD-10-CM

## 2012-06-18 MED ORDER — INSULIN GLARGINE 100 UNIT/ML ~~LOC~~ SOLN: 60.0000 [IU] | Freq: Two times a day (BID) | SUBCUTANEOUS | Status: DC

## 2012-06-19 ENCOUNTER — Other Ambulatory Visit (INDEPENDENT_AMBULATORY_CARE_PROVIDER_SITE_OTHER): Payer: BC Managed Care – PPO

## 2012-06-19 DIAGNOSIS — E119 Type 2 diabetes mellitus without complications: Secondary | ICD-10-CM

## 2012-06-19 LAB — LIPID PANEL
Cholesterol: 209 mg/dL — ABNORMAL HIGH (ref 0–200)
HDL: 32.4 mg/dL — ABNORMAL LOW (ref >39.00)
Triglycerides: 330 mg/dL — ABNORMAL HIGH (ref 0.0–149.0)
VLDL: 66 mg/dL — ABNORMAL HIGH (ref 0.0–40.0)

## 2012-06-19 LAB — HEPATIC FUNCTION PANEL
Albumin: 3.8 g/dL (ref 3.5–5.2)
Alkaline Phosphatase: 40 U/L (ref 39–117)
Total Protein: 6.9 g/dL (ref 6.0–8.3)

## 2012-06-19 LAB — BASIC METABOLIC PANEL
BUN: 20 mg/dL (ref 6–23)
GFR: 98.79 mL/min (ref >60.00)
Potassium: 4.2 mEq/L (ref 3.5–5.1)
Sodium: 139 mEq/L (ref 135–145)

## 2012-06-19 LAB — HEMOGLOBIN A1C: Hgb A1c MFr Bld: 9.5 % — ABNORMAL HIGH (ref 4.6–6.5)

## 2012-06-26 ENCOUNTER — Ambulatory Visit (INDEPENDENT_AMBULATORY_CARE_PROVIDER_SITE_OTHER): Payer: BC Managed Care – PPO | Admitting: Internal Medicine

## 2012-06-26 ENCOUNTER — Encounter: Payer: Self-pay | Admitting: Internal Medicine

## 2012-06-26 ENCOUNTER — Telehealth: Payer: Self-pay | Admitting: Internal Medicine

## 2012-06-26 VITALS — BP 142/85 | HR 80 | Temp 98.2°F | Wt 256.0 lb

## 2012-06-26 DIAGNOSIS — E119 Type 2 diabetes mellitus without complications: Secondary | ICD-10-CM

## 2012-06-26 DIAGNOSIS — E785 Hyperlipidemia, unspecified: Secondary | ICD-10-CM

## 2012-06-26 MED ORDER — AZELASTINE HCL 0.1 % NA SOLN: 1.0000 | Freq: Two times a day (BID) | NASAL | Status: DC

## 2012-06-26 MED ORDER — ATORVASTATIN CALCIUM 40 MG PO TABS: 20.0000 mg | ORAL_TABLET | ORAL | Status: DC

## 2012-06-26 MED ORDER — DILTIAZEM GEL 2 %: 1.0000 "application " | Freq: Two times a day (BID) | CUTANEOUS | Status: DC | PRN

## 2012-06-26 NOTE — Telephone Encounter (Signed)
Victorino Dike from [phone]. She called in regards to the diltiazem 2 % GEL she would like to request this GEL in an ointment instead.

## 2012-06-26 NOTE — Progress Notes (Signed)
Patient ID: Zachary Warner, male   DOB: January 23, 1956, 57 y.o.   MRN: [id number]  patient comes in for followup of multiple medical problems including type 2 diabetes, hyperlipidemia, hypertension. The patient does not check blood sugar or blood pressure at home. The patetient does not follow an exercise or diet program. The patient denies any polyuria, polydipsia.  In the past the patient has gone to diabetic treatment center. The patient is tolerating medications  Without difficulty. The patient does admit to medication compliance except mostly misses breakfast and lunch insulin, and does not take lipitor (? Causing back pain)  Past Medical History  Diagnosis Date  . Diabetes mellitus   . Hypertension   . Hyperlipidemia   . Nephrolithiasis     History   Social History  . Marital Status: Married    Spouse Name: N/A    Number of Children: N/A  . Years of Education: N/A   Occupational History  . Not on file.   Social History Main Topics  . Smoking status: Never Smoker   . Smokeless tobacco: Not on file  . Alcohol Use: No  . Drug Use: No  . Sexually Active: No   Other Topics Concern  . Not on file   Social History Narrative  . No narrative on file    Past Surgical History  Procedure Laterality Date  . Hernia repair      Family History  Problem Relation Age of Onset  . Coronary artery disease      fhx  . Diabetes      fhx  . Heart disease Father   . Liver disease Father     No Known Allergies  Current Outpatient Prescriptions on File Prior to Visit  Medication Sig Dispense Refill  . aspirin 81 MG tablet Take 81 mg by mouth daily.        Marland Kitchen atorvastatin (LIPITOR) 40 MG tablet TAKE 1 TABLET ONCE DAILY  90 tablet  1  . azelastine (ASTELIN) 137 MCG/SPRAY nasal spray 1 spray by Nasal route 2 (two) times daily. Use in each nostril as directed       . glucose blood test strip 1 each by Other route as needed. Use as instructed       . insulin glargine (LANTUS) 100 UNIT/ML  injection Inject 0.6 mLs (60 Units total) into the skin 2 (two) times daily.  300 mL  11  . insulin lispro (HUMALOG KWIKPEN) 100 UNIT/ML injection Inject 18 units at breakfast and lunch and 40 units at dinner or as directed  30 mL  11  . JANUVIA 100 MG tablet TAKE ONE TABLET EVERY DAY  90 tablet  0  . lisinopril (PRINIVIL,ZESTRIL) 40 MG tablet TAKE 1 TABLET ONCE DAILY  90 tablet  1  . metFORMIN (GLUCOPHAGE) 1000 MG tablet TAKE 1 TABLET TWICE DAILY WITH MEALS.  180 tablet  1  . niacin 500 MG tablet Take 500 mg by mouth at bedtime.        No current facility-administered medications on file prior to visit.     patient denies chest pain, shortness of breath, orthopnea. Denies lower extremity edema, abdominal pain, change in appetite, change in bowel movements. Patient denies rashes, musculoskeletal complaints. No other specific complaints in a complete review of systems.   BP 162/74  Pulse 80  Temp(Src) 98.2 F (36.8 C) (Oral)  Wt 256 lb (116.121 kg)  BMI 36.73 kg/m2   well-developed well-nourished male in no acute distress. HEENT exam atraumatic,  normocephalic, neck supple without jugular venous distention. Chest clear to auscultation cardiac exam S1-S2 are regular. Abdominal exam overweight with bowel sounds, soft and nontender. Extremities no edema. Neurologic exam is alert with a normal gait.

## 2012-06-26 NOTE — Assessment & Plan Note (Signed)
?   Side effect from lipitor. Will decrease dose to 40mg  tab, 1/2 qod. Patient understands

## 2012-06-26 NOTE — Telephone Encounter (Signed)
Ok per Dr Cato Mulligan, pharmacy aware

## 2012-06-26 NOTE — Assessment & Plan Note (Signed)
Not controlled Advised compliance with a.m. And lunch insulin

## 2012-07-15 ENCOUNTER — Encounter: Payer: Self-pay | Admitting: *Deleted

## 2012-07-15 ENCOUNTER — Other Ambulatory Visit: Payer: Self-pay | Admitting: *Deleted

## 2012-07-15 MED ORDER — LISINOPRIL 40 MG PO TABS: ORAL_TABLET | ORAL | Status: DC

## 2012-07-15 MED ORDER — METFORMIN HCL 1000 MG PO TABS: ORAL_TABLET | ORAL | Status: DC

## 2012-07-15 MED ORDER — SITAGLIPTIN PHOSPHATE 100 MG PO TABS: ORAL_TABLET | ORAL | Status: DC

## 2012-10-21 ENCOUNTER — Other Ambulatory Visit (INDEPENDENT_AMBULATORY_CARE_PROVIDER_SITE_OTHER): Payer: BC Managed Care – PPO

## 2012-10-21 DIAGNOSIS — E119 Type 2 diabetes mellitus without complications: Secondary | ICD-10-CM

## 2012-10-21 LAB — HEPATIC FUNCTION PANEL
ALT: 25 U/L (ref 0–53)
AST: 16 U/L (ref 0–37)
Bilirubin, Direct: 0.1 mg/dL (ref 0.0–0.3)
Total Bilirubin: 0.7 mg/dL (ref 0.3–1.2)

## 2012-10-21 LAB — LIPID PANEL: HDL: 34.3 mg/dL — ABNORMAL LOW (ref >39.00)

## 2012-10-21 LAB — HEMOGLOBIN A1C: Hgb A1c MFr Bld: 9.5 % — ABNORMAL HIGH (ref 4.6–6.5)

## 2012-10-21 LAB — BASIC METABOLIC PANEL
BUN: 15 mg/dL (ref 6–23)
GFR: 98.67 mL/min (ref >60.00)
Potassium: 4.1 mEq/L (ref 3.5–5.1)

## 2012-10-28 ENCOUNTER — Ambulatory Visit: Payer: BC Managed Care – PPO | Admitting: Internal Medicine

## 2012-10-30 ENCOUNTER — Telehealth: Payer: Self-pay | Admitting: Internal Medicine

## 2012-10-30 NOTE — Telephone Encounter (Signed)
Pt would like to get the results of his blood sugar labs done last week. Pt has some concerns and would like prior to his appt next week. Pls call.

## 2012-10-30 NOTE — Telephone Encounter (Signed)
His labs are abnormal

## 2012-11-04 ENCOUNTER — Encounter: Payer: Self-pay | Admitting: Internal Medicine

## 2012-11-04 ENCOUNTER — Ambulatory Visit (INDEPENDENT_AMBULATORY_CARE_PROVIDER_SITE_OTHER): Payer: BC Managed Care – PPO | Admitting: Internal Medicine

## 2012-11-04 VITALS — BP 138/84 | HR 80 | Temp 98.5°F | Wt 251.0 lb

## 2012-11-04 DIAGNOSIS — E119 Type 2 diabetes mellitus without complications: Secondary | ICD-10-CM

## 2012-11-04 DIAGNOSIS — I10 Essential (primary) hypertension: Secondary | ICD-10-CM

## 2012-11-04 DIAGNOSIS — E785 Hyperlipidemia, unspecified: Secondary | ICD-10-CM

## 2012-11-04 DIAGNOSIS — E1159 Type 2 diabetes mellitus with other circulatory complications: Secondary | ICD-10-CM

## 2012-11-04 NOTE — Patient Instructions (Signed)
  Exercise 4 days weekly

## 2012-11-04 NOTE — Assessment & Plan Note (Signed)
Controlled- check labs in 6 months

## 2012-11-04 NOTE — Progress Notes (Signed)
patient comes in for followup of multiple medical problems including type 2 diabetes, hyperlipidemia, hypertension. The patient does check blood sugar regularly but not blood pressure at home. The patetient does not follow an exercise or diet program. The patient denies any polyuria, polydipsia.  In the past the patient has gone to diabetic treatment center. The patient is tolerating medications  Without difficulty. The patient does admit to medication compliance.   he states he is starting to exercise and eat better- has lost some weight- cbg as low as 49- highest 300. Exercising 2-3 times weekly. Just started one month ago.  Past Medical History  Diagnosis Date  . Diabetes mellitus   . Hypertension   . Hyperlipidemia   . Nephrolithiasis     History   Social History  . Marital Status: Married    Spouse Name: N/A    Number of Children: N/A  . Years of Education: N/A   Occupational History  . Not on file.   Social History Main Topics  . Smoking status: Never Smoker   . Smokeless tobacco: Not on file  . Alcohol Use: No  . Drug Use: No  . Sexually Active: No   Other Topics Concern  . Not on file   Social History Narrative  . No narrative on file    Past Surgical History  Procedure Laterality Date  . Hernia repair      Family History  Problem Relation Age of Onset  . Coronary artery disease      fhx  . Diabetes      fhx  . Heart disease Father   . Liver disease Father     No Known Allergies  Current Outpatient Prescriptions on File Prior to Visit  Medication Sig Dispense Refill  . aspirin 81 MG tablet Take 162 mg by mouth daily.       Marland Kitchen atorvastatin (LIPITOR) 40 MG tablet Take 0.5 tablets (20 mg total) by mouth every other day.  90 tablet  1  . azelastine (ASTELIN) 137 MCG/SPRAY nasal spray Place 1 spray into the nose 2 (two) times daily. Use in each nostril as directed  30 mL  11  . diltiazem 2 % GEL Apply 1 application topically 2 (two) times daily as needed.   30 g  11  . glucose blood test strip 1 each by Other route as needed. Use as instructed       . insulin glargine (LANTUS) 100 UNIT/ML injection Inject 0.6 mLs (60 Units total) into the skin 2 (two) times daily.  300 mL  11  . lisinopril (PRINIVIL,ZESTRIL) 40 MG tablet TAKE 1 TABLET ONCE DAILY  90 tablet  1  . metFORMIN (GLUCOPHAGE) 1000 MG tablet TAKE 1 TABLET TWICE DAILY WITH MEALS.  180 tablet  1  . niacin 500 MG tablet Take 500 mg by mouth at bedtime.       . sitaGLIPtin (JANUVIA) 100 MG tablet TAKE ONE TABLET EVERY DAY  90 tablet  1   No current facility-administered medications on file prior to visit.     patient denies chest pain, shortness of breath, orthopnea. Denies lower extremity edema, abdominal pain, change in appetite, change in bowel movements. Patient denies rashes, musculoskeletal complaints. No other specific complaints in a complete review of systems.   BP 162/88  Pulse 80  Temp(Src) 98.5 F (36.9 C) (Oral)  Wt 251 lb (113.853 kg)  BMI 36.01 kg/m2   well-developed well-nourished male in no acute distress. HEENT exam atraumatic, normocephalic,  neck supple without jugular venous distention. Chest clear to auscultation cardiac exam S1-S2 are regular. Abdominal exam overweight with bowel sounds, soft and nontender. Extremities no edema. Neurologic exam is alert with a normal gait. Wearing right eye patch

## 2012-11-04 NOTE — Assessment & Plan Note (Signed)
a1c not controlled but he has started exercising and has experienced some low blood sugars. i am hopeful that we can eventually titrate meds down.

## 2013-01-03 ENCOUNTER — Telehealth: Payer: Self-pay | Admitting: Internal Medicine

## 2013-01-03 MED ORDER — HYDROCODONE-ACETAMINOPHEN 10-325 MG PO TABS: 1.0000 | ORAL_TABLET | Freq: Three times a day (TID) | ORAL | Status: DC | PRN

## 2013-01-03 NOTE — Telephone Encounter (Signed)
Pt has a new kidney stone and its acting up and pt states Dr Cato Mulligan calls him in HYDROcodone-acetaminophen (VICODIN) 5-500 MG per tablet Advised pt he would need to PU RX. Pt is in [place] this am only and lives in [place]. Would like to know if he could get that rx ready this am due to this new kidney stone issue. Plsn advise

## 2013-01-06 NOTE — Telephone Encounter (Signed)
Pt passed kidney stone Friday.  He does not need rx now.  Rx shredded

## 2013-01-21 ENCOUNTER — Other Ambulatory Visit: Payer: Self-pay | Admitting: Internal Medicine

## 2013-03-07 ENCOUNTER — Other Ambulatory Visit (INDEPENDENT_AMBULATORY_CARE_PROVIDER_SITE_OTHER): Payer: BC Managed Care – PPO

## 2013-03-07 DIAGNOSIS — E1159 Type 2 diabetes mellitus with other circulatory complications: Secondary | ICD-10-CM

## 2013-03-07 LAB — MICROALBUMIN / CREATININE URINE RATIO
Creatinine,U: 170.7 mg/dL
Microalb Creat Ratio: 0.6 mg/g (ref 0.0–30.0)
Microalb, Ur: 1.1 mg/dL (ref 0.0–1.9)

## 2013-03-07 LAB — HEPATIC FUNCTION PANEL
ALT: 27 U/L (ref 0–53)
Alkaline Phosphatase: 45 U/L (ref 39–117)
Bilirubin, Direct: 0.1 mg/dL (ref 0.0–0.3)
Total Bilirubin: 0.7 mg/dL (ref 0.3–1.2)

## 2013-03-07 LAB — BASIC METABOLIC PANEL
BUN: 15 mg/dL (ref 6–23)
CO2: 26 mEq/L (ref 19–32)
Calcium: 9.3 mg/dL (ref 8.4–10.5)
GFR: 98.54 mL/min (ref >60.00)
Glucose, Bld: 180 mg/dL — ABNORMAL HIGH (ref 70–99)
Sodium: 137 mEq/L (ref 135–145)

## 2013-03-07 LAB — LIPID PANEL
HDL: 36.5 mg/dL — ABNORMAL LOW (ref >39.00)
VLDL: 43.8 mg/dL — ABNORMAL HIGH (ref 0.0–40.0)

## 2013-03-07 LAB — LDL CHOLESTEROL, DIRECT: Direct LDL: 79.5 mg/dL

## 2013-03-14 ENCOUNTER — Ambulatory Visit (INDEPENDENT_AMBULATORY_CARE_PROVIDER_SITE_OTHER): Payer: BC Managed Care – PPO | Admitting: Internal Medicine

## 2013-03-14 ENCOUNTER — Encounter: Payer: Self-pay | Admitting: Internal Medicine

## 2013-03-14 VITALS — BP 142/74 | Temp 98.3°F | Ht 70.0 in | Wt 254.0 lb

## 2013-03-14 DIAGNOSIS — Z23 Encounter for immunization: Secondary | ICD-10-CM

## 2013-03-14 DIAGNOSIS — E785 Hyperlipidemia, unspecified: Secondary | ICD-10-CM

## 2013-03-14 DIAGNOSIS — Z87442 Personal history of urinary calculi: Secondary | ICD-10-CM

## 2013-03-14 DIAGNOSIS — E119 Type 2 diabetes mellitus without complications: Secondary | ICD-10-CM

## 2013-03-14 LAB — HM DIABETES FOOT EXAM

## 2013-03-14 MED ORDER — HYDROCODONE-ACETAMINOPHEN 10-325 MG PO TABS: 1.0000 | ORAL_TABLET | Freq: Three times a day (TID) | ORAL | Status: DC | PRN

## 2013-03-14 NOTE — Progress Notes (Signed)
Pre visit review using our clinic review tool, if applicable. No additional management support is needed unless otherwise documented below in the visit note. 

## 2013-03-14 NOTE — Assessment & Plan Note (Signed)
Has had recurrence Refill hydrocodone

## 2013-03-14 NOTE — Progress Notes (Signed)
patient comes in for followup of multiple medical problems including type 2 diabetes, hyperlipidemia, hypertension. The patient does not check blood sugar or blood pressure at home. The patetient does not follow an exercise or diet program. The patient denies any polyuria, polydipsia.  In the past the patient has gone to diabetic treatment center. The patient is tolerating medications  Without difficulty. The patient does admit to medication compliance.   Past Medical History  Diagnosis Date  . Diabetes mellitus   . Hypertension   . Hyperlipidemia   . Nephrolithiasis     History   Social History  . Marital Status: Married    Spouse Name: N/A    Number of Children: N/A  . Years of Education: N/A   Occupational History  . Not on file.   Social History Main Topics  . Smoking status: Never Smoker   . Smokeless tobacco: Not on file  . Alcohol Use: No  . Drug Use: No  . Sexual Activity: No   Other Topics Concern  . Not on file   Social History Narrative  . No narrative on file    Past Surgical History  Procedure Laterality Date  . Hernia repair      Family History  Problem Relation Age of Onset  . Coronary artery disease      fhx  . Diabetes      fhx  . Heart disease Father   . Liver disease Father     No Known Allergies  Current Outpatient Prescriptions on File Prior to Visit  Medication Sig Dispense Refill  . aspirin 81 MG tablet Take 162 mg by mouth daily.       Marland Kitchen atorvastatin (LIPITOR) 40 MG tablet Take 0.5 tablets (20 mg total) by mouth every other day.  90 tablet  1  . azelastine (ASTELIN) 137 MCG/SPRAY nasal spray Place 1 spray into the nose 2 (two) times daily. Use in each nostril as directed  30 mL  11  . diltiazem 2 % GEL Apply 1 application topically 2 (two) times daily as needed.  30 g  11  . glucose blood test strip 1 each by Other route as needed. Use as instructed       . HYDROcodone-acetaminophen (NORCO) 10-325 MG per tablet Take 1 tablet by mouth  every 8 (eight) hours as needed for pain.  30 tablet  0  . insulin glargine (LANTUS) 100 UNIT/ML injection Inject 0.6 mLs (60 Units total) into the skin 2 (two) times daily.  300 mL  11  . insulin lispro (HUMALOG) 100 UNIT/ML injection Inject 18 units at breakfast and lunch and 45 units at dinner or as directed      . JANUVIA 100 MG tablet TAKE ONE TABLET EVERY DAY  90 tablet  1  . lisinopril (PRINIVIL,ZESTRIL) 40 MG tablet TAKE 1 TABLET ONCE DAILY  90 tablet  1  . metFORMIN (GLUCOPHAGE) 1000 MG tablet TAKE 1 TABLET TWICE DAILY WITH MEALS.  180 tablet  1  . niacin 500 MG tablet Take 500 mg by mouth at bedtime.        No current facility-administered medications on file prior to visit.     patient denies chest pain, shortness of breath, orthopnea. Denies lower extremity edema, abdominal pain, change in appetite, change in bowel movements. Patient denies rashes, musculoskeletal complaints. No other specific complaints in a complete review of systems.   BP 142/74  Temp(Src) 98.3 F (36.8 C) (Oral)  Ht 5\' 10"  (1.778 m)  Wt 254 lb (115.214 kg)  BMI 36.45 kg/m2  well-developed well-nourished male in no acute distress. HEENT exam atraumatic, normocephalic, neck supple without jugular venous distention. Chest clear to auscultation cardiac exam S1-S2 are regular. Abdominal exam overweight with bowel sounds, soft and nontender. Extremities no edema. Neurologic exam is alert with a normal gait.

## 2013-03-14 NOTE — Assessment & Plan Note (Signed)
Slight improvement in a1c Continue same meds increae exercise, decrease calories and decrease weight.

## 2013-03-14 NOTE — Assessment & Plan Note (Signed)
Lipid Panel     Component Value Date/Time   CHOL 151 03/07/2013 0835   TRIG 219.0* 03/07/2013 0835   HDL 36.50* 03/07/2013 0835   CHOLHDL 4 03/07/2013 0835   VLDL 43.8* 03/07/2013 0835   LDLCALC 75 10/21/2012 0825   Would improve with weight loss and exercise ldl is at goal (<100)

## 2013-03-21 ENCOUNTER — Other Ambulatory Visit: Payer: Self-pay | Admitting: Internal Medicine

## 2013-03-27 LAB — HM DIABETES EYE EXAM

## 2013-05-27 ENCOUNTER — Telehealth: Payer: Self-pay | Admitting: Internal Medicine

## 2013-05-27 MED ORDER — INSULIN ASPART 100 UNIT/ML ~~LOC~~ SOLN: SUBCUTANEOUS | Status: DC

## 2013-05-27 MED ORDER — INSULIN ASPART 100 UNIT/ML FLEXPEN: PEN_INJECTOR | SUBCUTANEOUS | Status: DC

## 2013-05-27 NOTE — Telephone Encounter (Signed)
rx changed to Novolog per Dr Cato MulliganSwords, same directions, rx sent in electronically

## 2013-05-27 NOTE — Telephone Encounter (Signed)
I received a fax denying Humalog.  The preferred medication under pt's plan is Novolin, Novolog products.

## 2013-07-01 ENCOUNTER — Other Ambulatory Visit: Payer: Self-pay | Admitting: Internal Medicine

## 2013-07-08 ENCOUNTER — Other Ambulatory Visit (INDEPENDENT_AMBULATORY_CARE_PROVIDER_SITE_OTHER): Payer: BC Managed Care – PPO

## 2013-07-08 DIAGNOSIS — E119 Type 2 diabetes mellitus without complications: Secondary | ICD-10-CM

## 2013-07-08 LAB — LIPID PANEL
Cholesterol: 227 mg/dL — ABNORMAL HIGH (ref 0–200)
HDL: 38.7 mg/dL — ABNORMAL LOW (ref >39.00)
LDL Cholesterol: 105 mg/dL — ABNORMAL HIGH (ref 0–99)
Total CHOL/HDL Ratio: 6
Triglycerides: 415 mg/dL — ABNORMAL HIGH (ref 0.0–149.0)
VLDL: 83 mg/dL — ABNORMAL HIGH (ref 0.0–40.0)

## 2013-07-08 LAB — MICROALBUMIN / CREATININE URINE RATIO
Creatinine,U: 137.7 mg/dL
MICROALB UR: 0.8 mg/dL (ref 0.0–1.9)
MICROALB/CREAT RATIO: 0.6 mg/g (ref 0.0–30.0)

## 2013-07-08 LAB — BASIC METABOLIC PANEL
BUN: 15 mg/dL (ref 6–23)
CALCIUM: 9.3 mg/dL (ref 8.4–10.5)
CO2: 25 mEq/L (ref 19–32)
CREATININE: 0.8 mg/dL (ref 0.4–1.5)
Chloride: 103 mEq/L (ref 96–112)
GFR: 99.78 mL/min (ref >60.00)
Glucose, Bld: 148 mg/dL — ABNORMAL HIGH (ref 70–99)
Potassium: 4.2 mEq/L (ref 3.5–5.1)
Sodium: 137 mEq/L (ref 135–145)

## 2013-07-08 LAB — HEPATIC FUNCTION PANEL
ALT: 25 U/L (ref 0–53)
AST: 22 U/L (ref 0–37)
Albumin: 3.8 g/dL (ref 3.5–5.2)
Alkaline Phosphatase: 36 U/L — ABNORMAL LOW (ref 39–117)
BILIRUBIN DIRECT: 0 mg/dL (ref 0.0–0.3)
BILIRUBIN TOTAL: 0.8 mg/dL (ref 0.3–1.2)
Total Protein: 7 g/dL (ref 6.0–8.3)

## 2013-07-08 LAB — HEMOGLOBIN A1C: Hgb A1c MFr Bld: 9.5 % — ABNORMAL HIGH (ref 4.6–6.5)

## 2013-07-08 LAB — TSH: TSH: 1.06 u[IU]/mL (ref 0.35–5.50)

## 2013-07-15 ENCOUNTER — Encounter: Payer: Self-pay | Admitting: Internal Medicine

## 2013-07-15 ENCOUNTER — Ambulatory Visit (INDEPENDENT_AMBULATORY_CARE_PROVIDER_SITE_OTHER): Payer: BC Managed Care – PPO | Admitting: Internal Medicine

## 2013-07-15 VITALS — BP 135/85 | HR 80 | Temp 98.1°F | Ht 70.0 in | Wt 254.0 lb

## 2013-07-15 DIAGNOSIS — E785 Hyperlipidemia, unspecified: Secondary | ICD-10-CM

## 2013-07-15 DIAGNOSIS — I10 Essential (primary) hypertension: Secondary | ICD-10-CM

## 2013-07-15 DIAGNOSIS — H4920 Sixth [abducent] nerve palsy, unspecified eye: Secondary | ICD-10-CM

## 2013-07-15 DIAGNOSIS — Z23 Encounter for immunization: Secondary | ICD-10-CM

## 2013-07-15 DIAGNOSIS — K602 Anal fissure, unspecified: Secondary | ICD-10-CM

## 2013-07-15 DIAGNOSIS — E119 Type 2 diabetes mellitus without complications: Secondary | ICD-10-CM

## 2013-07-15 LAB — HM DIABETES FOOT EXAM

## 2013-07-15 MED ORDER — LISINOPRIL 40 MG PO TABS: ORAL_TABLET | ORAL | Status: DC

## 2013-07-15 MED ORDER — SITAGLIPTIN PHOSPHATE 100 MG PO TABS: ORAL_TABLET | ORAL | Status: DC

## 2013-07-15 MED ORDER — ATORVASTATIN CALCIUM 40 MG PO TABS: 20.0000 mg | ORAL_TABLET | ORAL | Status: DC

## 2013-07-15 NOTE — Assessment & Plan Note (Signed)
Adequate control Continue meds 

## 2013-07-15 NOTE — Progress Notes (Signed)
dm2- here for f/u.  He is compliant with meds "most of the time" but it appears from labs that he is not taking statin A1C is also markedly elevated  htn- no home bps  Lipids- does not appear that he is taking statin based on results  Has some sinus congestion but that is improving  Past Medical History  Diagnosis Date  . Diabetes mellitus   . Hypertension   . Hyperlipidemia   . Nephrolithiasis     History   Social History  . Marital Status: Married    Spouse Name: N/A    Number of Children: N/A  . Years of Education: N/A   Occupational History  . Not on file.   Social History Main Topics  . Smoking status: Never Smoker   . Smokeless tobacco: Not on file  . Alcohol Use: No  . Drug Use: No  . Sexual Activity: No   Other Topics Concern  . Not on file   Social History Narrative  . No narrative on file    Past Surgical History  Procedure Laterality Date  . Hernia repair      Family History  Problem Relation Age of Onset  . Coronary artery disease      fhx  . Diabetes      fhx  . Heart disease Father   . Liver disease Father     No Known Allergies  Current Outpatient Prescriptions on File Prior to Visit  Medication Sig Dispense Refill  . aspirin 81 MG tablet Take 162 mg by mouth daily.       Marland Kitchen. atorvastatin (LIPITOR) 40 MG tablet Take 0.5 tablets (20 mg total) by mouth every other day.  90 tablet  1  . azelastine (ASTELIN) 137 MCG/SPRAY nasal spray Place 1 spray into the nose 2 (two) times daily. Use in each nostril as directed  30 mL  11  . diltiazem 2 % GEL Apply 1 application topically 2 (two) times daily as needed.  30 g  11  . glucose blood test strip 1 each by Other route as needed. Use as instructed       . HYDROcodone-acetaminophen (NORCO) 10-325 MG per tablet Take 1 tablet by mouth every 8 (eight) hours as needed.  30 tablet  0  . insulin aspart (NOVOLOG) 100 UNIT/ML FlexPen INJECT 18 UNITS SUBQUE AT BREAKFAST AND LUNCH, AND INJECT 40 UNITS  SUBQUE AT DINNER OR AS DIRECTED  3 mL  5  . JANUVIA 100 MG tablet TAKE ONE TABLET EVERY DAY  90 tablet  1  . LANTUS 100 UNIT/ML injection INJECT 60 UNITS SUB-Q TWICE DAILY  30 mL  5  . lisinopril (PRINIVIL,ZESTRIL) 40 MG tablet TAKE 1 TABLET ONCE DAILY  90 tablet  1  . metFORMIN (GLUCOPHAGE) 1000 MG tablet TAKE 1 TABLET TWICE DAILY WITH MEALS.  180 tablet  1  . niacin 500 MG tablet Take 500 mg by mouth at bedtime.        No current facility-administered medications on file prior to visit.     patient denies chest pain, shortness of breath, orthopnea. Denies lower extremity edema, abdominal pain, change in appetite, change in bowel movements. Patient denies rashes, musculoskeletal complaints. No other specific complaints in a complete review of systems.   Reviewed vitals  well-developed well-nourished male in no acute distress. HEENT exam atraumatic, normocephalic, neck supple without jugular venous distention. Chest clear to auscultation cardiac exam S1-S2 are regular. Abdominal exam overweight with bowel sounds, soft  and nontender. Extremities no edema. Neurologic exam is alert with a normal gait.

## 2013-07-15 NOTE — Assessment & Plan Note (Signed)
He needs to resume statin

## 2013-07-15 NOTE — Assessment & Plan Note (Signed)
No recurrence. 

## 2013-07-15 NOTE — Progress Notes (Signed)
Pre visit review using our clinic review tool, if applicable. No additional management support is needed unless otherwise documented below in the visit note. 

## 2013-07-15 NOTE — Assessment & Plan Note (Signed)
Resolved Likely related to uncontrolled DM

## 2013-07-15 NOTE — Assessment & Plan Note (Signed)
Lab Results  Component Value Date   HGBA1C 9.5* 07/08/2013   He needs better control Will refer to endocrinology

## 2013-07-16 ENCOUNTER — Telehealth: Payer: Self-pay | Admitting: Internal Medicine

## 2013-07-16 NOTE — Telephone Encounter (Signed)
Relevant patient education assigned to patient using Emmi. ° °

## 2013-07-25 ENCOUNTER — Telehealth: Payer: Self-pay

## 2013-07-25 NOTE — Telephone Encounter (Signed)
Relevant patient education assigned to patient using Emmi. ° °

## 2013-07-28 ENCOUNTER — Telehealth: Payer: Self-pay | Admitting: Internal Medicine

## 2013-07-28 MED ORDER — METFORMIN HCL 1000 MG PO TABS: ORAL_TABLET | ORAL | Status: DC

## 2013-07-28 NOTE — Telephone Encounter (Signed)
Ok per Dr Cato MulliganSwords, rx sent in electronically

## 2013-07-28 NOTE — Telephone Encounter (Signed)
[facility] - [place] - [address] is requesting re-fill on metFORMIN (GLUCOPHAGE) 1000 MG tablet

## 2013-08-04 ENCOUNTER — Encounter: Payer: Self-pay | Admitting: Internal Medicine

## 2013-08-04 ENCOUNTER — Ambulatory Visit (INDEPENDENT_AMBULATORY_CARE_PROVIDER_SITE_OTHER): Payer: BC Managed Care – PPO | Admitting: Internal Medicine

## 2013-08-04 VITALS — BP 130/74 | HR 82 | Temp 98.6°F | Ht 70.0 in | Wt 255.0 lb

## 2013-08-04 DIAGNOSIS — E119 Type 2 diabetes mellitus without complications: Secondary | ICD-10-CM

## 2013-08-04 MED ORDER — "INSULIN SYRINGE-NEEDLE U-100 31G X 15/64"" 1 ML MISC": Status: DC

## 2013-08-04 MED ORDER — INSULIN PEN NEEDLE 32G X 4 MM MISC: Status: DC

## 2013-08-04 MED ORDER — INSULIN GLARGINE 100 UNIT/ML ~~LOC~~ SOLN: SUBCUTANEOUS | Status: DC

## 2013-08-04 MED ORDER — INSULIN ASPART 100 UNIT/ML FLEXPEN: PEN_INJECTOR | SUBCUTANEOUS | Status: DC

## 2013-08-04 NOTE — Patient Instructions (Signed)
Please continue Metformin 1000 mg 2x a day. Stop Januvia when you run out of it. Decrease Lantus to 70 units at bedtime. Keep NovoLog at:  18 units before breakfast  18 units before lunch   42 units before dinner  Please return in 1 month with your sugar log.    PATIENT INSTRUCTIONS FOR TYPE 2 DIABETES:  **Please join MyChart!** - see attached instructions about how to join if you have not done so already.  DIET AND EXERCISE Diet and exercise is an important part of diabetic treatment.  We recommended aerobic exercise in the form of brisk walking (working between 40-60% of maximal aerobic capacity, similar to brisk walking) for 150 minutes per week (such as 30 minutes five days per week) along with 3 times per week performing 'resistance' training (using various gauge rubber tubes with handles) 5-10 exercises involving the major muscle groups (upper body, lower body and core) performing 10-15 repetitions (or near fatigue) each exercise. Start at half the above goal but build slowly to reach the above goals. If limited by weight, joint pain, or disability, we recommend daily walking in a swimming pool with water up to waist to reduce pressure from joints while allow for adequate exercise.    BLOOD GLUCOSES Monitoring your blood glucoses is important for continued management of your diabetes. Please check your blood glucoses 2-4 times a day: fasting, before meals and at bedtime (you can rotate these measurements - e.g. one day check before the 3 meals, the next day check before 2 of the meals and before bedtime, etc.).   HYPOGLYCEMIA (low blood sugar) Hypoglycemia is usually a reaction to not eating, exercising, or taking too much insulin/ other diabetes drugs.  Symptoms include tremors, sweating, hunger, confusion, headache, etc. Treat IMMEDIATELY with 15 grams of Carbs:   4 glucose tablets    cup regular juice/soda   2 tablespoons raisins   4 teaspoons sugar   1 tablespoon  honey Recheck blood glucose in 15 mins and repeat above if still symptomatic/blood glucose <100.  RECOMMENDATIONS TO REDUCE YOUR RISK OF DIABETIC COMPLICATIONS: * Take your prescribed MEDICATION(S) * Follow a DIABETIC diet: Complex carbs, fiber rich foods, (monounsaturated and polyunsaturated) fats * AVOID saturated/trans fats, high fat foods, >2,300 mg salt per day. * EXERCISE at least 5 times a week for 30 minutes or preferably daily.  * DO NOT SMOKE OR DRINK more than 1 drink a day. * Check your FEET every day. Do not wear tightfitting shoes. Contact us if you develop an ulcer * See your EYE doctor once a year or more if needed * Get a FLU shot once a year * Get a PNEUMONIA vaccine once before and once after age 58 years  GOALS:  * Your Hemoglobin A1c of <7%  * fasting sugars need to be <130 * after meals sugars need to be <180 (2h after you start eating) * Your Systolic BP should be 140 or lower  * Your Diastolic BP should be 80 or lower  * Your HDL (Good Cholesterol) should be 40 or higher  * Your LDL (Bad Cholesterol) should be 100 or lower. * Your Triglycerides should be 150 or lower  * Your Urine microalbumin (kidney function) should be <30 * Your Body Mass Index should be 25 or lower   We will be glad to help you achieve these goals. Our telephone number is: [phone].

## 2013-08-04 NOTE — Progress Notes (Signed)
Patient ID: Zachary Warner, male   DOB: 1956-03-08, 58 y.o.   MRN: [id number]  HPI: Zachary Warner is a 58 y.o.-year-old male, referred by his PCP, Dr. Cato MulliganSwords, for management of DM2, insulin-dependent, uncontrolled, without complications.  Patient has been diagnosed with diabetes in ~1995; he started insulin in ~2005.  Last hemoglobin A1c was: Lab Results  Component Value Date   HGBA1C 9.5* 07/08/2013   HGBA1C 9.3* 03/07/2013   HGBA1C 9.5* 10/21/2012   Pt is on a regimen of: - Metformin 1000 mg po bid - Januvia 100 mg daily - Lantus 60 units bid - vial - does not forget - NovoLog 18-18-42 units tid ac - takes it right when he start eating - pen - he may forget the am and lunch NovoLog. Only takes 4/7 NovoLog with dinner.  He rotates the sites. Injects NovoLog in the abdomen, Lantus in thigh.   Pt is not checking sugars - in 6 months! He has a ReliOn meter.  No lows (did not feel any); he has hypoglycemia awareness at 60.  Highest sugar was 250-300.  Pt's meals are: - Breakfast: grits or sandwich - Lunch: sandwich - Dinner: chicken or beef or pork + vegetables - Snacks: 1 or 2 snacks  - crackers, peanuts or cookies He walks for exercise.  - no CKD, last BUN/creatinine:  Lab Results  Component Value Date   BUN 15 07/08/2013   CREATININE 0.8 07/08/2013  On lisinopril. - last set of lipids: Lab Results  Component Value Date   CHOL 227* 07/08/2013   HDL 38.70* 07/08/2013   LDLCALC 105* 07/08/2013   LDLDIRECT 79.5 03/07/2013   TRIG 415.0* 07/08/2013   CHOLHDL 6 07/08/2013  On Lipitor 20 qod (restarted 2 weeks ago). - [facility] in 10/2012 [facility] No DR.  - no numbness and tingling in his feet.  I reviewed his chart and he also has a history of HTN, HL, h/o nephrolithiasis x 3, last in 03/2013, OSA.  Pt has FH of DM in [place] and sister.  ROS: Constitutional: + weight gain, + fatigue, no subjective hyperthermia/hypothermia, + poor sleep, +  excessive urination Eyes: no blurry vision, no xerophthalmia ENT: no sore throat, no nodules palpated in throat, no dysphagia/odynophagia, no hoarseness Cardiovascular: no CP/SOB/palpitations/leg swelling Respiratory: no cough/SOB Gastrointestinal: no N/V/+ D/no C Musculoskeletal: no muscle/joint aches Skin: no rashes Neurological: no tremors/numbness/tingling/dizziness Psychiatric: no depression/anxiety Diff with erections.  Past Medical History  Diagnosis Date  . Diabetes mellitus   . Hypertension   . Hyperlipidemia   . Nephrolithiasis    Past Surgical History  Procedure Laterality Date  . Hernia repair     History   Social History  . Marital Status: Married    Spouse Name: N/A    Number of Children: 2   Occupational History  . Clinical biochemistlectric motor repair.   Social History Main Topics  . Smoking status: Never Smoker   . Smokeless tobacco: Not on file  . Alcohol Use: No  . Drug Use: No   Current Outpatient Prescriptions on File Prior to Visit  Medication Sig Dispense Refill  . aspirin 81 MG tablet Take 162 mg by mouth daily.       Marland Kitchen. atorvastatin (LIPITOR) 40 MG tablet Take 0.5 tablets (20 mg total) by mouth every other day.  90 tablet  1  . azelastine (ASTELIN) 137 MCG/SPRAY nasal spray Place 1 spray into the nose 2 (two) times daily. Use in  each nostril as directed  30 mL  11  . diltiazem 2 % GEL Apply 1 application topically 2 (two) times daily as needed.  30 g  11  . glucose blood test strip 1 each by Other route as needed. Use as instructed       . lisinopril (PRINIVIL,ZESTRIL) 40 MG tablet TAKE 1 TABLET ONCE DAILY  90 tablet  1  . metFORMIN (GLUCOPHAGE) 1000 MG tablet TAKE 1 TABLET TWICE DAILY WITH MEALS.  180 tablet  1   No current facility-administered medications on file prior to visit.   No Known Allergies Family History  Problem Relation Age of Onset  . Coronary artery disease      fhx  . Diabetes      fhx  . Heart disease Father   . Liver disease  Father    PE: There were no vitals taken for this visit. Wt Readings from Last 3 Encounters:  07/15/13 254 lb (115.214 kg)  03/14/13 254 lb (115.214 kg)  11/04/12 251 lb (113.853 kg)   Constitutional: overweight, in NAD Eyes: PERRLA, EOMI, no exophthalmos ENT: moist mucous membranes, no thyromegaly, no cervical lymphadenopathy Cardiovascular: RRR, No MRG Respiratory: CTA B Gastrointestinal: abdomen soft, NT, ND, BS+ Musculoskeletal: no deformities, strength intact in all 4 Skin: moist, warm, no rashes Neurological: no tremor with outstretched hands, DTR normal in all 4  ASSESSMENT: 1. DM2, insulin-dependent, uncontrolled, without complications  PLAN:  1. Patient with long-standing, uncontrolled diabetes, on oral antidiabetic regimen + basal-bolus insulin. He is not completely compliant with the regimen. He also injects NovoLog right at the time of eating >> advised to inject 15 min before. He can get lows at night, which is likely 2/2 too much basal insulin. Advised not to skip NovoLog and will decrease the Lantus. - I suggested to:  Patient Instructions  Please continue Metformin 1000 mg 2x a day. Stop Januvia when you run out of it. Decrease Lantus to 70 units at bedtime. Keep NovoLog at:  18 units before breakfast  18 units before lunch   42 units before dinner Please return in 1 month with your sugar log.  - Strongly advised him to start checking sugars at different times of the day - check at least 3 times a day, rotating checks - given sugar log and advised how to fill it and to bring it at next appt  - given foot care handout and explained the principles  - given instructions for hypoglycemia management "15-15 rule"  - advised for yearly eye exams >> he is up to date - refilled insulin and pen needles/syringes - Return to clinic in 1 mo with sugar log

## 2013-09-04 ENCOUNTER — Ambulatory Visit (INDEPENDENT_AMBULATORY_CARE_PROVIDER_SITE_OTHER): Payer: BC Managed Care – PPO | Admitting: Internal Medicine

## 2013-09-04 ENCOUNTER — Encounter: Payer: Self-pay | Admitting: Internal Medicine

## 2013-09-04 VITALS — BP 138/68 | HR 93 | Temp 98.6°F | Resp 12 | Wt 254.0 lb

## 2013-09-04 DIAGNOSIS — E119 Type 2 diabetes mellitus without complications: Secondary | ICD-10-CM

## 2013-09-04 NOTE — Progress Notes (Signed)
Patient ID: Zachary Warner, male   DOB: 04/13/1955, 58 y.o.   MRN: [id number]  HPI: Zachary Warner is a 58 y.o.-year-old male, returning for f/u for DM2, dx in ~1995, insulin-dependent since ~2005, uncontrolled, without complications.  Last hemoglobin A1c was: Lab Results  Component Value Date   HGBA1C 9.5* 07/08/2013   HGBA1C 9.3* 03/07/2013   HGBA1C 9.5* 10/21/2012   He rotates the sites. Injects NovoLog in the abdomen, Lantus in thigh.   He is on: - Metformin 1000 mg 2x a day. - Lantus 60 units bid >> 70 units at bedtime (vial). - NovoLog (pen) - at last visit, he forgot this requently:  18 units before breakfast  18 units before lunch   42 units before dinner We stopped Januvia 2/2 large doses of mealtime insulin.  Pt was not checking sugars, but started to do so - reviewed his log - sugars improved: - am: [phone] - lunch: 124-178 - 2h after lunch: 114-160 - dinner: 74, 96, 133, 156 - 2h after dinner: 135, 158, 226 - bedtime: 95-278, most < 190  No lows (except 74 -after golf); has hypoglycemia awareness at 60.  Highest sugar was 278.   He has 2 True Result meters.  Pt's meals are: - Breakfast: grits or sandwich - Lunch: sandwich - Dinner: chicken or beef or pork + vegetables - Snacks: 1 or 2 snacks  - crackers, peanuts or cookies He walks for exercise. He also started to improve his diet.  - no CKD, last BUN/creatinine:  Lab Results  Component Value Date   BUN 15 07/08/2013   CREATININE 0.8 07/08/2013  On lisinopril. - last set of lipids: Lab Results  Component Value Date   CHOL 227* 07/08/2013   HDL 38.70* 07/08/2013   LDLCALC 105* 07/08/2013   LDLDIRECT 79.5 03/07/2013   TRIG 415.0* 07/08/2013   CHOLHDL 6 07/08/2013  On Lipitor 20 qod. - last eye exam was in 10/2012 - Dr Ambrose [facility]le [facility] No DR.  - no numbness and tingling in his feet.  He also has a history of HTN, HL, h/o nephrolithiasis x 3, last in 03/2013, OSA.  I reviewed pt's  medications, allergies, PMH, social hx, family hx and no changes required, except as mentioned above.  ROS: Constitutional: no weight gain, no fatigue, no subjective hyperthermia/hypothermia, + poor sleep Eyes: no blurry vision, no xerophthalmia ENT: no sore throat, no nodules palpated in throat, no dysphagia/odynophagia, no hoarseness Cardiovascular: no CP/SOB/palpitations/leg swelling Respiratory: no cough/SOB Gastrointestinal: no N/V/D/Phillips Musculoskeletal: no muscle/joint aches Skin: no rashes Neurological: no tremors/numbness/tingling/dizziness  PE: BP 138/68  Pulse 93  Temp(Src) 98.6 F (37 C) (Oral)  Resp 12  Wt 254 lb (115.214 kg)  SpO2 96% Wt Readings from Last 3 Encounters:  09/04/13 254 lb (115.214 kg)  08/04/13 255 lb (115.667 kg)  07/15/13 254 lb (115.214 kg)   Constitutional: overweight, in NAD Eyes: PERRLA, EOMI, no exophthalmos ENT: moist mucous membranes, no thyromegaly, no cervical lymphadenopathy Cardiovascular: RRR, No MRG Respiratory: CTA B Gastrointestinal: abdomen soft, NT, ND, BS+ Musculoskeletal: no deformities, strength intact in all 4 Skin: moist, warm, no rashes Neurological: no tremor with outstretched hands, DTR normal in all 4  ASSESSMENT: 1. DM2, insulin-dependent, uncontrolled, without complications  PLAN:  1. Patient with long-standing, uncontrolled diabetes, with better control in last month despite reducing the Lantus insulin almost to half. He does not miss his NovoLog anymore and he started to change his diet >> sugars better!!! - I  suggested to:  Patient Instructions  Please continue Metformin 1000 mg 2x a day. Stop Januvia when you run out of it. Continue Lantus at 70 units at bedtime. Keep NovoLog at:  18 units before breakfast  18 units before lunch   42 units before dinner Try to stop the snacks at night, or, if you have a snack, make this a carb-free one. Please return in 1 month with your sugar log.  - continue checking  sugars at different times of the day - check at least 3 times a day, rotating checks - given new sugar logs and advised how to fill it and to bring it at next appt  - he is up to date with yearly eye exams - Return to clinic in 1 mo with sugar log

## 2013-09-04 NOTE — Patient Instructions (Signed)
Please continue Metformin 1000 mg 2x a day. Stop Januvia when you run out of it. Continue Lantus at 70 units at bedtime. Keep NovoLog at:  18 units before breakfast  18 units before lunch   42 units before dinner Try to stop the snacks at night, or, if you have a snack, make this a carb-free one. Please return in 1 month with your sugar log.

## 2013-10-06 ENCOUNTER — Telehealth: Payer: Self-pay | Admitting: Internal Medicine

## 2013-10-06 NOTE — Telephone Encounter (Signed)
[facility] has called the pt twice, due to his rx has expired, states the request has been sent to dr. Cato MulliganSwords however [facility] has not heard anything. Pt is needing his c-pap machine.

## 2013-10-07 NOTE — Telephone Encounter (Signed)
Order faxed to [facility], pt aware

## 2013-10-24 ENCOUNTER — Other Ambulatory Visit: Payer: Self-pay | Admitting: Internal Medicine

## 2013-10-24 DIAGNOSIS — E119 Type 2 diabetes mellitus without complications: Secondary | ICD-10-CM

## 2013-11-04 ENCOUNTER — Encounter: Payer: Self-pay | Admitting: Internal Medicine

## 2013-11-04 ENCOUNTER — Ambulatory Visit (INDEPENDENT_AMBULATORY_CARE_PROVIDER_SITE_OTHER): Payer: BC Managed Care – PPO | Admitting: Internal Medicine

## 2013-11-04 VITALS — BP 164/84 | HR 84 | Temp 98.2°F | Resp 12 | Wt 252.0 lb

## 2013-11-04 DIAGNOSIS — E119 Type 2 diabetes mellitus without complications: Secondary | ICD-10-CM

## 2013-11-04 LAB — HEMOGLOBIN A1C: Hgb A1c MFr Bld: 8.2 % — ABNORMAL HIGH (ref 4.6–6.5)

## 2013-11-04 NOTE — Patient Instructions (Signed)
Please continue Metformin 1000 mg 2x a day. Increase Lantus to 75 units at bedtime. Use the NovoLog insulin with meals as follows:  18 units before breakfast  24 units before lunch   42 units before dinner Please return in 1.5 months with your sugar log.  Please stop at the lab.

## 2013-11-04 NOTE — Progress Notes (Signed)
Patient ID: Zachary Warner, male   DOB: 03-Mar-1956, 58 y.o.   MRN: [id number]  HPI: Zachary Warner is a 58 y.o.-year-old male, returning for f/u for DM2, dx in ~1995, insulin-dependent since ~2005, uncontrolled, without complications. Last visit 2 mo ago.  Last hemoglobin A1c was: Lab Results  Component Value Date   HGBA1C 9.5* 07/08/2013   HGBA1C 9.3* 03/07/2013   HGBA1C 9.5* 10/21/2012  He rotates the sites. Injects NovoLog in the abdomen, Lantus in thigh.   He is on: - Metformin 1000 mg 2x a day. - Lantus 60 units bid >> 70 units at bedtime (vial). - NovoLog (pen) - not forgetting it:  18 units before breakfast  18 units before lunch   42 units before dinner We stopped Januvia 2/2 large doses of mealtime insulin.  Pt checks his sugars 1-3x a day - reviewed his log - sugars same as before - ave 160: - am: [phone] >> 111-199, 2x 200s after forgot Lantus - lunch: 124-178 >> 115, 135 - 2h after lunch: 114-160 >> n/c - dinner: 74, 96, 133, 156 >> 122-199 - 2h after dinner: 135, 158, 226 >> this is bedtime - see below - bedtime: 95-278, most < 190 >> 154-221 No lows (except 79 x1); has hypoglycemia awareness at 60.  Highest sugar was 221.   He has 2 True Result meters.  Pt's meals are: - Breakfast: grits or sandwich - Lunch: sandwich - Dinner: chicken or beef or pork + vegetables - Snacks: 1 or 2 snacks  - crackers, peanuts or cookies He walks for exercise. He also started to improve his diet.  - no CKD, last BUN/creatinine:  Lab Results  Component Value Date   BUN 15 07/08/2013   CREATININE 0.8 07/08/2013  On lisinopril. - last set of lipids: Lab Results  Component Value Date   CHOL 227* 07/08/2013   HDL 38.70* 07/08/2013   LDLCALC 105* 07/08/2013   LDLDIRECT 79.5 03/07/2013   TRIG 415.0* 07/08/2013   CHOLHDL 6 07/08/2013  On Lipitor 20 qod. - last eye exam was in 10/2012 - Dr Ambrose Mantle [facility] No DR.  - no numbness and tingling in his feet.  He also  has a history of HTN, HL, h/o nephrolithiasis x 3, last in 03/2013, OSA.  I reviewed pt's medications, allergies, PMH, social hx, family hx and no changes required, except as mentioned above.  ROS: Constitutional: no weight gain, no fatigue, no subjective hyperthermia/hypothermia, + poor sleep Eyes: no blurry vision, no xerophthalmia ENT: no sore throat, no nodules palpated in throat, no dysphagia/odynophagia, no hoarseness Cardiovascular: no CP/SOB/palpitations/leg swelling Respiratory: no cough/SOB Gastrointestinal: no N/V/+D/no C Musculoskeletal: no muscle/joint aches Skin: no rashes Neurological: no tremors/numbness/tingling/dizziness + diff with erections  PE: BP 164/84  Pulse 84  Temp(Src) 98.2 F (36.8 C) (Oral)  Resp 12  Wt 252 lb (114.306 kg)  SpO2 97% Wt Readings from Last 3 Encounters:  11/04/13 252 lb (114.306 kg)  09/04/13 254 lb (115.214 kg)  08/04/13 255 lb (115.667 kg)   Constitutional: overweight, in NAD Eyes: PERRLA, EOMI, no exophthalmos ENT: moist mucous membranes, no thyromegaly, no cervical lymphadenopathy Cardiovascular: RRR, No MRG Respiratory: CTA B Gastrointestinal: abdomen soft, NT, ND, BS+ Musculoskeletal: no deformities, strength intact in all 4 Skin: moist, warm, no rashes Neurological: no tremor with outstretched hands, DTR normal in all 4  ASSESSMENT: 1. DM2, insulin-dependent, uncontrolled, without complications  PLAN:  1. Patient with long-standing, uncontrolled diabetes, with improved control in last  months - still higher sugars before dinner >> at bedtime >> in am. I will advised him to increase Lantus and the lunchtime NovoLog. - I suggested to:  Patient Instructions  Please continue Metformin 1000 mg 2x a day. Increase Lantus to 75 units at bedtime. Use the NovoLog insulin with meals as follows:  18 units before breakfast  24 units before lunch (increased from 18 units)  42 units before dinner Please return in 1.5 months  with your sugar log.  Please stop at the lab. - continue checking sugars at different times of the day - check 3 times a day, rotating checks - given new sugar logs   - he is up to date with yearly eye exams, but due for another one soon - check HbA1c today - Return to clinic in 1.5 mo with sugar log   Office Visit on 11/04/2013  Component Date Value Ref Range Status  . Hemoglobin A1C 11/04/2013 8.2* 4.6 - 6.5 % Final   Glycemic Control Guidelines for People with Diabetes:Non Diabetic:  <6%Goal of Therapy: <7%Additional Action Suggested:  >8%    HbA1c much better!

## 2013-12-16 ENCOUNTER — Ambulatory Visit: Payer: BC Managed Care – PPO | Admitting: Internal Medicine

## 2014-01-24 ENCOUNTER — Other Ambulatory Visit: Payer: Self-pay | Admitting: Internal Medicine

## 2014-01-26 ENCOUNTER — Other Ambulatory Visit: Payer: Self-pay | Admitting: Internal Medicine

## 2014-01-26 DIAGNOSIS — E785 Hyperlipidemia, unspecified: Secondary | ICD-10-CM

## 2014-01-26 NOTE — Telephone Encounter (Signed)
Nothing documented in note

## 2014-01-27 MED ORDER — ATORVASTATIN CALCIUM 40 MG PO TABS: 20.0000 mg | ORAL_TABLET | ORAL | Status: DC

## 2014-01-27 NOTE — Telephone Encounter (Signed)
Rx sent to pharmacy   

## 2014-01-27 NOTE — Telephone Encounter (Signed)
Pt req rx on atorvastatin (LIPITOR) 40 MG tablet   [facility] [place]

## 2014-02-03 ENCOUNTER — Other Ambulatory Visit (INDEPENDENT_AMBULATORY_CARE_PROVIDER_SITE_OTHER): Payer: BC Managed Care – PPO | Admitting: *Deleted

## 2014-02-03 ENCOUNTER — Ambulatory Visit (INDEPENDENT_AMBULATORY_CARE_PROVIDER_SITE_OTHER): Payer: BC Managed Care – PPO | Admitting: Internal Medicine

## 2014-02-03 ENCOUNTER — Encounter: Payer: Self-pay | Admitting: Internal Medicine

## 2014-02-03 VITALS — BP 124/66 | HR 92 | Temp 98.6°F | Resp 12 | Wt 249.8 lb

## 2014-02-03 DIAGNOSIS — Z23 Encounter for immunization: Secondary | ICD-10-CM

## 2014-02-03 DIAGNOSIS — E119 Type 2 diabetes mellitus without complications: Secondary | ICD-10-CM

## 2014-02-03 MED ORDER — INSULIN GLARGINE 300 UNIT/ML ~~LOC~~ SOPN: 75.0000 [IU] | PEN_INJECTOR | Freq: Every day | SUBCUTANEOUS | Status: DC

## 2014-02-03 MED ORDER — CANAGLIFLOZIN 100 MG PO TABS: 100.0000 mg | ORAL_TABLET | Freq: Every day | ORAL | Status: DC

## 2014-02-03 NOTE — Patient Instructions (Signed)
Please continue: - Metformin 1000 mg 2x a day. - NovoLog: 18 units before breakfast 25 units before lunch  48 units before dinner Please stop Lantus and start Toujeo 75 units at bedtime. Start Invokana 100 mg daily before breakfast.  Please return in 1 month with your sugar log.

## 2014-02-03 NOTE — Progress Notes (Signed)
Patient ID: Zachary Warner, male   DOB: [birth year], 58 y.o.   MRN: [id number]  HPI: Zachary Warner is a 58 y.o.-year-old male, returning for f/u for DM2, dx in ~1995, insulin-dependent since ~2005, uncontrolled, without complications. Last visit 3 mo ago.  Last hemoglobin A1c was: Lab Results  Component Value Date   HGBA1C 8.2* 11/04/2013   HGBA1C 9.5* 07/08/2013   HGBA1C 9.3* 03/07/2013  He rotates the sites. Injects NovoLog in the abdomen, Lantus in thigh.   He is on: - Metformin 1000 mg 2x a day. - Lantus 60 units bid >> 70 >> 75 units at bedtime (vial). - NovoLog (pen) - can forget it 2x a week:  18 units before breakfast  18 >> 24 >> 25 units before lunch   42 >> 48 units before dinner (may forgot and inject it after dinner) We stopped Januvia 2/2 large doses of mealtime insulin.  Pt checks his sugars 1-3x a day - forgot his log  - ave 160 >> last 2 weeks: 184: - am: [phone] >> 111-199, 2x 200s after forgot Lantus >> 72-180 (most 150-160), 230 - lunch: 124-178 >> 115, 135 >> n/c - 2h after lunch: 114-160 >> n/c - dinner: 74, 96, 133, 156 >> 122-199 >> 140-170 - 2h after dinner: 135, 158, 226 >> this is bedtime - see below - bedtime: 95-278, most < 190 >> 154-221 >> 200s No lows (except 72 x1); has hypoglycemia awareness at 60.  Highest sugar was 200s.   He has 2 True Result meters.  Pt's meals are: - Breakfast: grits or sandwich - Lunch: sandwich - Dinner: chicken or beef or pork + vegetables - Snacks: 1 or 2 snacks  - crackers, peanuts or cookies He walks for exercise. He also started to improve his diet.  - no CKD, last BUN/creatinine:  Lab Results  Component Value Date   BUN 15 07/08/2013   CREATININE 0.8 07/08/2013  On lisinopril. - last set of lipids: Lab Results  Component Value Date   CHOL 227* 07/08/2013   HDL 38.70* 07/08/2013   LDLCALC 105* 07/08/2013   LDLDIRECT 79.5 03/07/2013   TRIG 415.0* 07/08/2013   CHOLHDL 6 07/08/2013  On  Atorvastatin 1/2 every 3 days. - [facility] was in 10/2012 [facility] No DR. Has an appt coming up. - no numbness and tingling in his feet.  He also has a history of HTN, HL, h/o nephrolithiasis x 3, last in 03/2013, OSA.  I reviewed pt's medications, allergies, PMH, social hx, family hx and no changes required, except as mentioned above.  ROS: Constitutional: no weight gain, no fatigue, no subjective hyperthermia/hypothermia, + poor sleep Eyes: no blurry vision, no xerophthalmia ENT: no sore throat, no nodules palpated in throat, no dysphagia/odynophagia, no hoarseness Cardiovascular: no CP/SOB/palpitations/leg swelling Respiratory: no cough/SOB Gastrointestinal: no N/V/D/C Musculoskeletal: no muscle/joint aches Skin: no rashes Neurological: no tremors/numbness/tingling/dizziness  PE: BP 124/66 mmHg  Pulse 92  Temp(Src) 98.6 F (37 C) (Oral)  Resp 12  Wt 249 lb 12.8 oz (113.309 kg)  SpO2 97% Wt Readings from Last 3 Encounters:  02/03/14 249 lb 12.8 oz (113.309 kg)  11/04/13 252 lb (114.306 kg)  09/04/13 254 lb (115.214 kg)   Constitutional: overweight, in NAD Eyes: PERRLA, EOMI, no exophthalmos ENT: moist mucous membranes, no thyromegaly, no cervical lymphadenopathy Cardiovascular: RRR, No MRG Respiratory: CTA B Gastrointestinal: abdomen soft, NT, ND, BS+ Musculoskeletal: no deformities, strength intact in all 4 Skin: moist,  warm, no rashes Neurological: no tremor with outstretched hands, DTR normal in all 4  ASSESSMENT: 1. DM2, insulin-dependent, uncontrolled, without complications  PLAN:  1. Patient with long-standing, uncontrolled diabetes, with slightly worse control in last months per his average - still higher sugars before at bedtime.. I will advised him to switch to Toujeo and start Invokana. - I suggested to:  Patient Instructions  Please continue: - Metformin 1000 mg 2x a day. - NovoLog: 18 units before breakfast 25 units  before lunch  48 units before dinner Please stop Lantus and start Toujeo 75 units at bedtime. Start Invokana 100 mg daily before breakfast.  Please return in 1 month with your sugar log.   - we discussed about SEs of Invokana, which are: dizziness (advised to be careful when stands from sitting position), decreased BP - usually not < normal (BP today is not low), and fungal UTIs (advised to let me know if develops one).  - given discount card for Invokana - continue checking sugars at different times of the day - check 3 times a day, rotating checks  - he is due for another one soon - check HbA1c and BMP at next visit - Return to clinic in 1 mo with sugar log

## 2014-03-04 ENCOUNTER — Telehealth: Payer: Self-pay | Admitting: Internal Medicine

## 2014-03-04 MED ORDER — FLUCONAZOLE 150 MG PO TABS: 150.0000 mg | ORAL_TABLET | Freq: Once | ORAL | Status: DC

## 2014-03-04 NOTE — Telephone Encounter (Signed)
Pt started invokana 3 wks ago. He is having the genital discomfort, pain and itching.

## 2014-03-04 NOTE — Telephone Encounter (Signed)
Called pt and advised him per Dr Charlean SanfilippoGherghe's note. Pt understood. Rx called in.

## 2014-03-04 NOTE — Telephone Encounter (Signed)
Please read note below and advise.  

## 2014-03-04 NOTE — Telephone Encounter (Signed)
Let's call in Diflucan 150 mg #1 with 1 refill. If recurs, call us.

## 2014-03-07 ENCOUNTER — Other Ambulatory Visit: Payer: Self-pay | Admitting: Internal Medicine

## 2014-03-09 ENCOUNTER — Other Ambulatory Visit: Payer: Self-pay | Admitting: *Deleted

## 2014-03-09 MED ORDER — METFORMIN HCL 1000 MG PO TABS: ORAL_TABLET | ORAL | Status: DC

## 2014-03-11 ENCOUNTER — Ambulatory Visit (INDEPENDENT_AMBULATORY_CARE_PROVIDER_SITE_OTHER): Payer: BC Managed Care – PPO | Admitting: Internal Medicine

## 2014-03-11 ENCOUNTER — Encounter: Payer: Self-pay | Admitting: Internal Medicine

## 2014-03-11 VITALS — BP 142/72 | HR 84 | Temp 98.1°F | Resp 12 | Wt 250.0 lb

## 2014-03-11 DIAGNOSIS — E1165 Type 2 diabetes mellitus with hyperglycemia: Secondary | ICD-10-CM

## 2014-03-11 DIAGNOSIS — IMO0001 Reserved for inherently not codable concepts without codable children: Secondary | ICD-10-CM

## 2014-03-11 LAB — BASIC METABOLIC PANEL
BUN: 18 mg/dL (ref 6–23)
CHLORIDE: 104 meq/L (ref 96–112)
CO2: 22 mEq/L (ref 19–32)
Calcium: 9.3 mg/dL (ref 8.4–10.5)
Creatinine, Ser: 0.9 mg/dL (ref 0.4–1.5)
GFR: 87.43 mL/min (ref >60.00)
GLUCOSE: 205 mg/dL — AB (ref 70–99)
Potassium: 4.5 mEq/L (ref 3.5–5.1)
Sodium: 133 mEq/L — ABNORMAL LOW (ref 135–145)

## 2014-03-11 LAB — HEMOGLOBIN A1C: Hgb A1c MFr Bld: 8.8 % — ABNORMAL HIGH (ref 4.6–6.5)

## 2014-03-11 MED ORDER — INSULIN GLARGINE 300 UNIT/ML ~~LOC~~ SOPN: 75.0000 [IU] | PEN_INJECTOR | Freq: Every day | SUBCUTANEOUS | Status: DC

## 2014-03-11 MED ORDER — METFORMIN HCL 1000 MG PO TABS: ORAL_TABLET | ORAL | Status: DC

## 2014-03-11 NOTE — Progress Notes (Signed)
Patient ID: Zachary Warner [place], male   DOB: Oct 17, 1955, 58 y.o.   MRN: [id number]  HPI: Zachary Warner [place] is a 58 y.o.-year-old male, returning for f/u for DM2, dx in ~1995, insulin-dependent since ~2005, uncontrolled, without complications. Last visit 1 mo ago.  Last hemoglobin A1c was: Lab Results  Component Value Date   HGBA1C 8.2* 11/04/2013   HGBA1C 9.5* 07/08/2013   HGBA1C 9.3* 03/07/2013  He rotates the sites. Injects NovoLog in the abdomen, Lantus in thigh.   He is on: - Metformin 1000 mg 2x a day. - NovoLog - missed 3x in 2 weeks: 18 units before breakfast 25 units before lunch  48 units before dinner - Toujeo 75 units at bedtime. - Invokana 100 mg daily before breakfast - started 01/2014 >> had 1 yeast inf >> Diflucan x2 pills We stopped Januvia 2/2 large doses of mealtime insulin.  Pt checks his sugars 1-3x a day - reviewed log: - am: [phone] >> 111-199, 2x 200s after forgot Lantus >> 72-180 (most 150-160), 230 >> 95, 115-176, 186 - lunch: 124-178 >> 115, 135 >> n/c >> 124 - 2h after lunch: 114-160 >> n/c - dinner: 74, 96, 133, 156 >> 122-199 >> 140-170 >> 113, 155, 156 - 2h after dinner: 135, 158, 226 >> n/c - bedtime: 95-278, most < 190 >> 154-221 >> 200s >> n/c No lows; lowest 95; has hypoglycemia awareness at 60.  Highest sugar was 200s.   He has 2 True Result meters.  Pt's meals are: - Breakfast: grits or sandwich - Lunch: sandwich - Dinner: chicken or beef or pork + vegetables - Snacks: 1 or 2 snacks  - crackers, peanuts or cookies He walks for exercise. He also started to improve his diet.  - no CKD, last BUN/creatinine:  Lab Results  Component Value Date   BUN 15 07/08/2013   CREATININE 0.8 07/08/2013  On lisinopril. - last set of lipids: Lab Results  Component Value Date   CHOL 227* 07/08/2013   HDL 38.70* 07/08/2013   LDLCALC 105* 07/08/2013   LDLDIRECT 79.5 03/07/2013   TRIG 415.0* 07/08/2013   CHOLHDL 6 07/08/2013  On Atorvastatin 1/2 every  3 days. - last eye exam was in 10/2012 - [facility] No DR. Has an appt coming up. - no numbness and tingling in his feet.  He also has a history of HTN, HL, h/o nephrolithiasis x 3, last in 03/2013, OSA.  I reviewed pt's medications, allergies, PMH, social hx, family hx and no changes required, except as mentioned above.  ROS: Constitutional: + weight loss, no fatigue, no subjective hyperthermia/hypothermia, + poor sleep, + nocturia Eyes: no blurry vision, no xerophthalmia ENT: no sore throat, no nodules palpated in throat, no dysphagia/odynophagia, no hoarseness Cardiovascular: no CP/SOB/palpitations/leg swelling Respiratory: no cough/SOB Gastrointestinal: no N/V/D/C Musculoskeletal: no muscle/joint aches Skin: no rashes Neurological: no tremors/numbness/tingling/dizziness  PE: BP 142/72 mmHg  Pulse 84  Temp(Src) 98.1 F (36.7 C) (Oral)  Resp 12  Wt 250 lb (113.399 kg)  SpO2 96% Wt Readings from Last 3 Encounters:  03/11/14 250 lb (113.399 kg)  02/03/14 249 lb 12.8 oz (113.309 kg)  11/04/13 252 lb (114.306 kg)   Constitutional: overweight, in NAD Eyes: PERRLA, EOMI, no exophthalmos ENT: moist mucous membranes, no thyromegaly, no cervical lymphadenopathy Cardiovascular: RRR, No MRG Respiratory: CTA B Gastrointestinal: abdomen soft, NT, ND, BS+ Musculoskeletal: no deformities, strength intact in all 4 Skin: moist, warm, no rashes Neurological: no tremor with outstretched hands, DTR  normal in all 4  ASSESSMENT: 1. DM2, insulin-dependent, uncontrolled, without complications  PLAN:  1. Patient with long-standing, uncontrolled diabetes, with same level of control in last month despite switching to Toujeo and starting Invokana, however, he ate more around the first part of the Holidays >> strongly advised him to watch his diet! But will not change regimen. - I suggested to:  Patient Instructions  Please continue: - Metformin 1000 mg 2x a day. -  NovoLog: 18 units before breakfast 25 units before lunch  48 units before dinner - Toujeo 75 units at bedtime - Invokana 100 mg daily before breakfast  Please pay attention to your diet.  Try not to miss insulin.  Please stop at the lab.   - continue checking sugars at different times of the day - check 3 times a day, rotating checks  - he is due for another eye exam - check HbA1c and BMP today - Return to clinic in 1.5 mo with sugar log   Office Visit on 03/11/2014  Component Date Value Ref Range Status  . Hgb A1c MFr Bld 03/11/2014 8.8* 4.6 - 6.5 % Final   Glycemic Control Guidelines for People with Diabetes:Non Diabetic:  <6%Goal of Therapy: <7%Additional Action Suggested:  >8%   . Sodium 03/11/2014 133* 135 - 145 mEq/L Final  . Potassium 03/11/2014 4.5  3.5 - 5.1 mEq/L Final  . Chloride 03/11/2014 104  96 - 112 mEq/L Final  . CO2 03/11/2014 22  19 - 32 mEq/L Final  . Glucose, Bld 03/11/2014 205* 70 - 99 mg/dL Final  . BUN 40/98/119112/16/2015 18  6 - 23 mg/dL Final  . Creatinine, Ser 03/11/2014 0.9  0.4 - 1.5 mg/dL Final  . Calcium 47/82/956212/16/2015 9.3  8.4 - 10.5 mg/dL Final  . GFR 13/08/657812/16/2015 87.43  >60.00 mL/min Final   A1c higher >> need to watch diet and have more CBG checks >> will review them at next visit.

## 2014-03-11 NOTE — Patient Instructions (Signed)
Please continue: - Metformin 1000 mg 2x a day. - NovoLog: 18 units before breakfast 25 units before lunch  48 units before dinner - Toujeo 75 units at bedtime - Invokana 100 mg daily before breakfast  Please pay attention to your diet.  Try not to miss insulin.  Please stop at the lab.

## 2014-03-30 ENCOUNTER — Other Ambulatory Visit: Payer: Self-pay | Admitting: Internal Medicine

## 2014-04-22 ENCOUNTER — Encounter: Payer: Self-pay | Admitting: Internal Medicine

## 2014-04-22 ENCOUNTER — Ambulatory Visit (INDEPENDENT_AMBULATORY_CARE_PROVIDER_SITE_OTHER): Payer: BC Managed Care – PPO | Admitting: Internal Medicine

## 2014-04-22 VITALS — BP 160/76 | HR 85 | Temp 97.7°F | Resp 12 | Wt 251.0 lb

## 2014-04-22 DIAGNOSIS — E119 Type 2 diabetes mellitus without complications: Secondary | ICD-10-CM

## 2014-04-22 NOTE — Progress Notes (Signed)
Patient ID: Zachary Warner, male   DOB: April 25, 1955, 59 y.o.   MRN: [id number]  HPI: Zachary Warner is a 59 y.o.-year-old male, returning for f/u for DM2, dx in ~1995, insulin-dependent since ~2005, uncontrolled, without complications. Last visit 1.5 mo ago.  Last hemoglobin A1c was: Lab Results  Component Value Date   HGBA1C 8.8* 03/11/2014   HGBA1C 8.2* 11/04/2013   HGBA1C 9.5* 07/08/2013  He rotates the sites. Injects NovoLog in the abdomen, Lantus in thigh.   He is on: - Metformin 1000 mg 2x a day. - NovoLog - missed doses rarely: 18 units before breakfast 25 units before lunch  48 units before dinner - Toujeo 75 units at bedtime. - Invokana 100 mg daily before breakfast - started 01/2014 >> had 1 yeast inf >> Diflucan x2 pills. No more yeast inf.  We stopped Januvia 2/2 large doses of mealtime insulin.  Pt checks his sugars 1-3x a day - reviewed log - higher with the Holidays, but improved afterwards: - am: 72-180 (most 150-160), 230 >> 95, 115-176, 186 >> 92, 101-165, with higher values in 02/2014 - lunch: 124-178 >> 115, 135 >> n/c >> 124 >> n/c - 2h after lunch: 114-160 >> n/c >> 74 (delayed meal - post lunch), 145 - dinner: 74, 96, 133, 156 >> 122-199 >> 140-170 >> 113, 155, 156  - 2h after dinner: 135, 158, 226 >> n/c - bedtime: 95-278, most < 190 >> 154-221 >> 200s >> n/c >>167, 205 No lows; lowest 95; has hypoglycemia awareness at 60.  Highest sugar was 200s.   He has 2 True Result meters.  Pt's meals are: - Breakfast: grits or sandwich - Lunch: sandwich - Dinner: chicken or beef or pork + vegetables - Snacks: 1 or 2 snacks  - crackers, peanuts or cookies He walks for exercise. He also started to improve his diet.  - no CKD, last BUN/creatinine:  Lab Results  Component Value Date   BUN 18 03/11/2014   CREATININE 0.9 03/11/2014  On lisinopril. - last set of lipids: Lab Results  Component Value Date   CHOL 227* 07/08/2013   HDL 38.70* 07/08/2013   LDLCALC  105* 07/08/2013   LDLDIRECT 79.5 03/07/2013   TRIG 415.0* 07/08/2013   CHOLHDL 6 07/08/2013  On Atorvastatin 1/2 every 3 days. - last [facility] was in 03/2013 [facility] No DR.  - no numbness and tingling in his feet.  He also has a history of HTN, HL, h/o nephrolithiasis x 3, last in 03/2013, OSA.  I reviewed pt's medications, allergies, PMH, social hx, family hx, and changes were documented in the history of present illness. Otherwise, unchanged from my initial visit note.  ROS: Constitutional: + weight loss, no fatigue, no subjective hyperthermia/hypothermia, + poor sleep, + nocturia Eyes: no blurry vision, no xerophthalmia ENT: no sore throat, no nodules palpated in throat, no dysphagia/odynophagia, no hoarseness Cardiovascular: no CP/SOB/palpitations/leg swelling Respiratory: no cough/SOB Gastrointestinal: no N/V/D/C Musculoskeletal: + muscle/no joint aches; + back pain Skin: no rashes Neurological: no tremors/numbness/tingling/dizziness  PE: BP 160/76 mmHg  Pulse 85  Temp(Src) 97.7 F (36.5 C) (Oral)  Resp 12  Wt 251 lb (113.853 kg)  SpO2 97% Wt Readings from Last 3 Encounters:  04/22/14 251 lb (113.853 kg)  03/11/14 250 lb (113.399 kg)  02/03/14 249 lb 12.8 oz (113.309 kg)   Constitutional: overweight, in NAD Eyes: PERRLA, EOMI, no exophthalmos ENT: moist mucous membranes, no thyromegaly, no cervical lymphadenopathy Cardiovascular: RRR,  No MRG Respiratory: CTA B Gastrointestinal: abdomen soft, NT, ND, BS+ Musculoskeletal: no deformities, strength intact in all 4 Skin: moist, warm, no rashes Neurological: no tremor with outstretched hands, DTR normal in all 4  ASSESSMENT: 1. DM2, insulin-dependent, uncontrolled, without complications  PLAN:  1. Patient with long-standing, uncontrolled diabetes, with improved sugars after the Holidays. We will not change regimen. - I suggested to:  Patient Instructions  Please continue: - Metformin  1000 mg 2x a day. - NovoLog: 18 units before breakfast 25 units before lunch  48 units before dinner - Toujeo 75 units at bedtime - Invokana 100 mg daily before breakfast  Please pay attention to your diet.  Try not to miss insulin.  Please stop at the lab.   - continue checking sugars at different times of the day - check 3 times a day, rotating checks  - he is due for another eye exam >> advised to schedule - Return to clinic in 1.5-2 mo with sugar log

## 2014-04-22 NOTE — Patient Instructions (Signed)
Please continue: - Metformin 1000 mg 2x a day. - NovoLog: 18 units before breakfast 25 units before lunch  48 units before dinner - Toujeo 75 units at bedtime - Invokana 100 mg daily before breakfast  Please pay attention to your diet.  Try not to miss insulin.  Please return in 2 months with your sugar log.

## 2014-04-23 ENCOUNTER — Encounter: Payer: Self-pay | Admitting: Family Medicine

## 2014-04-23 ENCOUNTER — Ambulatory Visit (INDEPENDENT_AMBULATORY_CARE_PROVIDER_SITE_OTHER): Payer: BLUE CROSS/BLUE SHIELD | Admitting: Family Medicine

## 2014-04-23 VITALS — BP 130/74 | HR 89 | Temp 98.0°F | Wt 251.0 lb

## 2014-04-23 DIAGNOSIS — S39012A Strain of muscle, fascia and tendon of lower back, initial encounter: Secondary | ICD-10-CM

## 2014-04-23 MED ORDER — MELOXICAM 15 MG PO TABS: 15.0000 mg | ORAL_TABLET | Freq: Every day | ORAL | Status: DC

## 2014-04-23 NOTE — Progress Notes (Signed)
Pre visit review using our clinic review tool, if applicable. No additional management support is needed unless otherwise documented below in the visit note. 

## 2014-04-23 NOTE — Progress Notes (Signed)
   Subjective:    Patient ID: Zachary Warner, male    DOB: March 17, 1956, 59 y.o.   MRN: [id number]  HPI Low back pain. Onset Monday. He was leaning forward at work and not doing any particular lifting but felt a pulling sensation in his right lower lumbar region. No radiculopathy symptoms. His pain was initially about a 6 or 7 out of 10 currently only about 3 out of 10 in much improved after taking some ice yesterday. He also had left over hydrocodone and Flexeril and thought was may have helped somewhat. He denies any urinary symptoms. No fevers or chills. Pain is worse with flexing back. No pain at rest.  Past Medical History  Diagnosis Date  . Diabetes mellitus   . Hypertension   . Hyperlipidemia   . Nephrolithiasis    Past Surgical History  Procedure Laterality Date  . Hernia repair      reports that he has never smoked. He does not have any smokeless tobacco history on file. He reports that he does not drink alcohol or use illicit drugs. family history includes Coronary artery disease in an other family member; Diabetes in an other family member; Heart disease in his father; Liver disease in his father. Allergies  Allergen Reactions  . Invokana [Canagliflozin] Other (See Comments)    Yeast infection      Review of Systems  Constitutional: Negative for fever, activity change and appetite change.  Respiratory: Negative for cough and shortness of breath.   Cardiovascular: Negative for chest pain and leg swelling.  Gastrointestinal: Negative for vomiting and abdominal pain.  Genitourinary: Negative for dysuria, hematuria and flank pain.  Musculoskeletal: Positive for back pain. Negative for joint swelling.  Neurological: Negative for weakness and numbness.       Objective:   Physical Exam  Constitutional: He appears well-developed and well-nourished.  Cardiovascular: Normal rate and regular rhythm.   Pulmonary/Chest: Effort normal and breath sounds normal. No respiratory  distress. He has no wheezes. He has no rales.  Musculoskeletal: He exhibits no edema.  Straight leg raises are negative bilaterally  Neurological:  Full-strength lower extremities. Symmetric reflexes knee and ankle bilaterally.          Assessment & Plan:  Lumbosacral back strain. Continued heat or ice for symptomatic relief. Meloxicam 15 mg once daily as needed for pain. Proper lifting technique reviewed. Follow-up when necessary

## 2014-04-23 NOTE — Patient Instructions (Signed)

## 2014-05-04 ENCOUNTER — Other Ambulatory Visit: Payer: Self-pay | Admitting: Internal Medicine

## 2014-06-13 ENCOUNTER — Other Ambulatory Visit: Payer: Self-pay | Admitting: Internal Medicine

## 2014-06-22 ENCOUNTER — Ambulatory Visit (INDEPENDENT_AMBULATORY_CARE_PROVIDER_SITE_OTHER): Payer: BLUE CROSS/BLUE SHIELD | Admitting: Internal Medicine

## 2014-06-22 ENCOUNTER — Encounter: Payer: Self-pay | Admitting: Internal Medicine

## 2014-06-22 VITALS — BP 124/68 | HR 83 | Temp 97.7°F | Resp 12 | Wt 251.0 lb

## 2014-06-22 DIAGNOSIS — E119 Type 2 diabetes mellitus without complications: Secondary | ICD-10-CM | POA: Diagnosis not present

## 2014-06-22 LAB — HEMOGLOBIN A1C: Hgb A1c MFr Bld: 8.2 % — ABNORMAL HIGH (ref 4.6–6.5)

## 2014-06-22 NOTE — Progress Notes (Signed)
Patient ID: Zachary Warner, male   DOB: 08/16/1955, 59 y.o.   MRN: [id number]  HPI: Zachary Warner is a 59 y.o.-year-old male, returning for f/u for DM2, dx in ~1995, insulin-dependent since ~2005, uncontrolled, without complications. Last visit 2 mo ago.  Last hemoglobin A1c was: Lab Results  Component Value Date   HGBA1C 8.8* 03/11/2014   HGBA1C 8.2* 11/04/2013   HGBA1C 9.5* 07/08/2013  He rotates the sites.   He is on: - Metformin 1000 mg 2x a day. - Invokana 100 mg daily before breakfast - started 01/2014 >> had 1 yeast inf >> Diflucan x2 pills. No more yeast inf.  - NovoLog - missed doses rarely: 18 units before breakfast 25 units before lunch  48 units before dinner - Toujeo 75 units at bedtime. We stopped Januvia 2/2 large doses of mealtime insulin.  Pt checks his sugars 1-3x a day - reviewed log - higher in am: - am: 72-180 (most 150-160), 230 >> 95, 115-176, 186 >> 92, 101-165 >> 122-182 - lunch: 124-178 >> 115, 135 >> n/c >> 124 >> n/c >> 138-161 >> 201 - 2h after lunch: 114-160 >> n/c >> 74 (delayed meal - post lunch), 145 >> 168 - dinner: 74, 96, 133, 156 >> 122-199 >> 140-170 >> 113, 155, 156 >> 74-145 - 2h after dinner: 135, 158, 226 >> n/c >> 190 - bedtime: 95-278, most < 190 >> 154-221 >> 200s >> n/c >>167, 205 >> n/c  - nighttime: 64, 75 No lows; lowest 95 >> 70s in the pm after work; has hypoglycemia awareness at 60.  Highest sugar was 200s.   He has 2 True Result meters.  Pt's meals are: - Breakfast: grits or sandwich - Lunch: sandwich - Dinner: chicken or beef or pork + vegetables - Snacks: 1 or 2 snacks  - crackers, peanuts or cookies He walks for exercise. He also started to improve his diet.  - no CKD, last BUN/creatinine:  Lab Results  Component Value Date   BUN 18 03/11/2014   CREATININE 0.9 03/11/2014  On lisinopril. - last set of lipids: Lab Results  Component Value Date   CHOL 227* 07/08/2013   HDL 38.70* 07/08/2013   LDLCALC 105*  07/08/2013   LDLDIRECT 79.5 03/07/2013   TRIG 415.0* 07/08/2013   CHOLHDL 6 07/08/2013  On Atorvastatin 1/2 every 3 days. - last eye exam was in 03/2013 - Dr Ambrose [facility] No DR. >> new one 06/2014. - no numbness and tingling in his feet.  He also has a history of HTN, HL, h/o nephrolithiasis x 3, last in 03/2013, OSA.  I reviewed pt's medications, allergies, PMH, social hx, family hx, and changes were documented in the history of present illness. Otherwise, unchanged from my initial visit note.  ROS: Constitutional: no weight loss/gain, no fatigue, no subjective hyperthermia/hypothermia Eyes: no blurry vision, no xerophthalmia ENT: no sore throat, no nodules palpated in throat, no dysphagia/odynophagia, no hoarseness Cardiovascular: no CP/SOB/palpitations/leg swelling Respiratory: no cough/SOB Gastrointestinal: no N/V/D/C Musculoskeletal: no muscle/no joint aches, back pain Skin: no rashes Neurological: no tremors/numbness/tingling/dizziness  PE: BP 124/68 mmHg  Pulse 83  Temp(Src) 97.7 F (36.5 C) (Oral)  Resp 12  Wt 251 lb (113.853 kg)  SpO2 95% Wt Readings from Last 3 Encounters:  06/22/14 251 lb (113.853 kg)  04/23/14 251 lb (113.853 kg)  04/22/14 251 lb (113.853 kg)   Constitutional: overweight, in NAD Eyes: PERRLA, EOMI, no exophthalmos ENT: moist mucous membranes, no thyromegaly, no cervical  lymphadenopathy Cardiovascular: RRR, No MRG Respiratory: CTA B Gastrointestinal: abdomen soft, NT, ND, BS+ Musculoskeletal: no deformities, strength intact in all 4 Skin: moist, warm, no rashes Neurological: no tremor with outstretched hands, DTR normal in all 4  ASSESSMENT: 1. DM2, insulin-dependent, uncontrolled, without complications  PLAN:  1. Patient with long-standing, uncontrolled diabetes, with still high sugars in am. I advised him to start adjusting the Novolog doses based on the size of the meal rather than using fixed doses. - I suggested to:   Patient Instructions  Please continue: - Metformin 1000 mg 2x a day. - Invokana 100 mg daily before breakfast  - Toujeo 75 units at bedtime  Please change: - NovoLog 20 units before a smaller meal (you can consider lunch a smaller meal) 30 units before a regular meal 40 units before a larger meal  Please stop at the lab.  Please come back for a follow-up appointment in 3 months  - continue checking sugars at different times of the day - check 3 times a day, rotating checks  - he is due for another eye exam >> scheduled in 06/2014 - check HbA1c today - Return to clinic in 3 mo with sugar log   Office Visit on 06/22/2014  Component Date Value Ref Range Status  . Hgb A1c MFr Bld 06/22/2014 8.2* 4.6 - 6.5 % Final   Glycemic Control Guidelines for People with Diabetes:Non Diabetic:  <6%Goal of Therapy: <7%Additional Action Suggested:  >8%     HbA1c is better.

## 2014-06-22 NOTE — Patient Instructions (Signed)
Please continue: - Metformin 1000 mg 2x a day. - Invokana 100 mg daily before breakfast  - Toujeo 75 units at bedtime  Please change: - NovoLog 20 units before a smaller meal (you can consider lunch a smaller meal) 30 units before a regular meal 40 units before a larger meal  Please stop at the lab.  Please come back for a follow-up appointment in 3 months

## 2014-06-29 ENCOUNTER — Other Ambulatory Visit: Payer: Self-pay | Admitting: Internal Medicine

## 2014-06-30 ENCOUNTER — Other Ambulatory Visit: Payer: Self-pay | Admitting: *Deleted

## 2014-06-30 MED ORDER — CANAGLIFLOZIN 100 MG PO TABS: 100.0000 mg | ORAL_TABLET | Freq: Every day | ORAL | Status: DC

## 2014-07-09 LAB — HM DIABETES EYE EXAM

## 2014-07-20 ENCOUNTER — Encounter: Payer: Self-pay | Admitting: Internal Medicine

## 2014-08-07 ENCOUNTER — Other Ambulatory Visit: Payer: Self-pay | Admitting: Internal Medicine

## 2014-08-18 DIAGNOSIS — I2510 Atherosclerotic heart disease of native coronary artery without angina pectoris: Secondary | ICD-10-CM

## 2014-08-18 HISTORY — DX: Atherosclerotic heart disease of native coronary artery without angina pectoris: I25.10

## 2014-08-19 DIAGNOSIS — E785 Hyperlipidemia, unspecified: Secondary | ICD-10-CM | POA: Insufficient documentation

## 2014-08-19 DIAGNOSIS — K219 Gastro-esophageal reflux disease without esophagitis: Secondary | ICD-10-CM

## 2014-08-19 HISTORY — DX: Gastro-esophageal reflux disease without esophagitis: K21.9

## 2014-08-26 ENCOUNTER — Other Ambulatory Visit: Payer: Self-pay | Admitting: *Deleted

## 2014-08-26 ENCOUNTER — Other Ambulatory Visit: Payer: Self-pay | Admitting: Internal Medicine

## 2014-08-26 MED ORDER — INSULIN ASPART 100 UNIT/ML FLEXPEN: PEN_INJECTOR | SUBCUTANEOUS | Status: DC

## 2014-09-29 ENCOUNTER — Other Ambulatory Visit: Payer: Self-pay | Admitting: Internal Medicine

## 2014-09-29 NOTE — Telephone Encounter (Signed)
This RX has already been refilled today

## 2014-10-06 ENCOUNTER — Ambulatory Visit (INDEPENDENT_AMBULATORY_CARE_PROVIDER_SITE_OTHER): Payer: BLUE CROSS/BLUE SHIELD | Admitting: Internal Medicine

## 2014-10-06 ENCOUNTER — Other Ambulatory Visit (INDEPENDENT_AMBULATORY_CARE_PROVIDER_SITE_OTHER): Payer: BC Managed Care – PPO | Admitting: *Deleted

## 2014-10-06 ENCOUNTER — Encounter: Payer: Self-pay | Admitting: Internal Medicine

## 2014-10-06 VITALS — BP 138/70 | HR 75 | Temp 98.3°F | Resp 12 | Wt 247.0 lb

## 2014-10-06 DIAGNOSIS — E1165 Type 2 diabetes mellitus with hyperglycemia: Secondary | ICD-10-CM

## 2014-10-06 LAB — POCT GLYCOSYLATED HEMOGLOBIN (HGB A1C): HEMOGLOBIN A1C: 7.5

## 2014-10-06 NOTE — Patient Instructions (Signed)
Please continue: - Metformin 1000 mg 2x a day. - Invokana 100 mg daily before breakfast  - Toujeo 75  units at bedtime  Please decrease: - NovoLog 20 units before a smaller meal  25 units before a regular meal 35 units before a larger meal  Please stop at the lab.  Please come back for a follow-up appointment in 3 months

## 2014-10-06 NOTE — Progress Notes (Signed)
Patient ID: Zachary Warner, male   DOB: [birth year], 59 y.o.   MRN: [id number]  HPI: Zachary Warner is a 59 y.o.-year-old male, returning for f/u for DM2, dx in ~1995, insulin-dependent since ~2005, uncontrolled, with complications (CAD - s/p AMI, DR). Last visit 3.5 mo ago.  He had an AMI 07/2014 >> stent. He feels much better now. Cardiologist: Dr Sedonia Small.  Last hemoglobin A1c was: Lab Results  Component Value Date   HGBA1C 8.2* 06/22/2014   HGBA1C 8.8* 03/11/2014   HGBA1C 8.2* 11/04/2013  He rotates the sites.   He is on: - Metformin 1000 mg 2x a day. - Invokana 100 mg daily before breakfast - started 01/2014 >> had 1 yeast inf >> Diflucan x2 pills. No more yeast inf.  - NovoLog - missed doses rarely: 18 units before breakfast 25 units before lunch  48 units before dinner - Toujeo 75 units at bedtime. We stopped Januvia 2/2 large doses of mealtime insulin.  Pt checks his sugars 1-3x a day - reviewed log - better!; - am: 72-180 (most 150-160), 230 >> 95, 115-176, 186 >> 92, 101-165 >> 122-182 >> 85-154 - lunch: 124-178 >> 115, 135 >> n/c >> 124 >> n/c >> 138-161 >> 201 >> 81, 136 - 2h after lunch: 114-160 >> n/c >> 74 (delayed meal - post lunch), 145 >> 168 >> n/c - dinner: 74, 96, 133, 156 >> 122-199 >> 140-170 >> 113, 155, 156 >> 74-145 >> 69-141 in last 3 weeks - 2h after dinner: 135, 158, 226 >> n/c >> 190 >>  - bedtime: 95-278, most < 190 >> 154-221 >> 200s >> n/c >>167, 205 >> n/c  - nighttime: 64, 75 No lows; lowest 95 >> 70s >> 69; has hypoglycemia awareness at 60.  Highest sugar was 200s >> 170s in last 3 week.   He has 2 True Result meters.  Pt's meals are: - Breakfast: grits or sandwich - Lunch: sandwich - Dinner: chicken or beef or pork + vegetables - Snacks: 1 or 2 snacks  - crackers, peanuts or cookies He walks for exercise. He also started to improve his diet.  - no CKD, last BUN/creatinine:  Lab Results  Component Value Date   BUN 18 03/11/2014   CREATININE 0.9 03/11/2014  On lisinopril. - last set of lipids: Lab Results  Component Value Date   CHOL 227* 07/08/2013   HDL 38.70* 07/08/2013   LDLCALC 105* 07/08/2013   LDLDIRECT 79.5 03/07/2013   TRIG 415.0* 07/08/2013   CHOLHDL 6 07/08/2013  On Atorvastatin 1/2 every 3 days. - last eye exam: Dr Ambrose Mantle [facility]  Abstract on 07/20/2014  Component Date Value Ref Range Status  . HM Diabetic Eye Exam 07/09/2014 Retinopathy* No Retinopathy Final   - no numbness and tingling in his feet.  He also has a history of HTN, HL, h/o nephrolithiasis x 3, last in 03/2013, OSA.  I reviewed pt's medications, allergies, PMH, social hx, family hx, and changes were documented in the history of present illness. Otherwise, unchanged from my initial visit note. Started Carvedilol.  ROS: Constitutional: no weight loss/gain, no fatigue, no subjective hyperthermia/hypothermia Eyes: no blurry vision, no xerophthalmia ENT: no sore throat, no nodules palpated in throat, no dysphagia/odynophagia, no hoarseness Cardiovascular: no CP/SOB/palpitations/leg swelling Respiratory: no cough/SOB Gastrointestinal: no N/V/D/C Musculoskeletal: no muscle/no joint aches, back pain Skin: no rashes Neurological: no tremors/numbness/tingling/dizziness  PE: BP 138/70 mmHg  Pulse 75  Temp(Src) 98.3 F (36.8 C) (Oral)  Resp 12  Wt 247 lb (112.038 kg)  SpO2 97% Wt Readings from Last 3 Encounters:  10/06/14 247 lb (112.038 kg)  06/22/14 251 lb (113.853 kg)  04/23/14 251 lb (113.853 kg)   Constitutional: overweight, in NAD Eyes: PERRLA, EOMI, no exophthalmos ENT: moist mucous membranes, no thyromegaly, no cervical lymphadenopathy Cardiovascular: RRR, No MRG Respiratory: CTA B Gastrointestinal: abdomen soft, NT, ND, BS+ Musculoskeletal: no deformities, strength intact in all 4 Skin: moist, warm, no rashes Neurological: no tremor with outstretched hands, DTR normal in all 4  ASSESSMENT: 1. DM2,  insulin-dependent, uncontrolled, with complications - CAD, s/p AMi - DR  PLAN:  1. Patient with long-standing, uncontrolled diabetes, with improved diabetes control after his stent. I advised him to start checking sugars at bedtime, too. Will back off the mealtime insulins as he started to have some lows especially later in the day. Advised him to back off even more if he still has lows. - I suggested to:  Patient Instructions  Please continue: - Metformin 1000 mg 2x a day. - Invokana 100 mg daily before breakfast  - Toujeo 75  units at bedtime  Please decrease: - NovoLog 20 units before a smaller meal  25 units before a regular meal 35 units before a larger meal  Please stop at the lab.  Please come back for a follow-up appointment in 3 months  - continue checking sugars at different times of the day - check 3 times a day, rotating checks  - UTD with eye exams - check HbA1c today >> 7.5% (improved!) - Return to clinic in 3 mo with sugar log

## 2014-11-24 ENCOUNTER — Other Ambulatory Visit: Payer: Self-pay | Admitting: *Deleted

## 2014-11-24 MED ORDER — INSULIN ASPART 100 UNIT/ML FLEXPEN: PEN_INJECTOR | SUBCUTANEOUS | Status: DC

## 2014-11-24 MED ORDER — INSULIN GLARGINE 300 UNIT/ML ~~LOC~~ SOPN: 75.0000 [IU] | PEN_INJECTOR | Freq: Every day | SUBCUTANEOUS | Status: DC

## 2014-11-24 MED ORDER — CANAGLIFLOZIN 100 MG PO TABS: 100.0000 mg | ORAL_TABLET | Freq: Every day | ORAL | Status: DC

## 2014-12-28 ENCOUNTER — Other Ambulatory Visit: Payer: Self-pay | Admitting: Internal Medicine

## 2015-01-05 LAB — LIPID PANEL
CHOLESTEROL: 159 mg/dL (ref 0–200)
HDL: 33 mg/dL — AB (ref 35–70)
LDL CALC: 79 mg/dL
Triglycerides: 236 mg/dL — AB (ref 40–160)

## 2015-01-06 ENCOUNTER — Encounter: Payer: Self-pay | Admitting: Internal Medicine

## 2015-01-06 ENCOUNTER — Other Ambulatory Visit (INDEPENDENT_AMBULATORY_CARE_PROVIDER_SITE_OTHER): Payer: BLUE CROSS/BLUE SHIELD | Admitting: *Deleted

## 2015-01-06 ENCOUNTER — Ambulatory Visit (INDEPENDENT_AMBULATORY_CARE_PROVIDER_SITE_OTHER): Payer: BLUE CROSS/BLUE SHIELD | Admitting: Internal Medicine

## 2015-01-06 VITALS — BP 136/68 | HR 82 | Temp 98.0°F | Resp 12 | Wt 247.6 lb

## 2015-01-06 DIAGNOSIS — E785 Hyperlipidemia, unspecified: Secondary | ICD-10-CM

## 2015-01-06 DIAGNOSIS — Z23 Encounter for immunization: Secondary | ICD-10-CM

## 2015-01-06 DIAGNOSIS — E1165 Type 2 diabetes mellitus with hyperglycemia: Secondary | ICD-10-CM

## 2015-01-06 DIAGNOSIS — Z794 Long term (current) use of insulin: Secondary | ICD-10-CM

## 2015-01-06 LAB — POCT GLYCOSYLATED HEMOGLOBIN (HGB A1C): HEMOGLOBIN A1C: 8.3

## 2015-01-06 MED ORDER — ATORVASTATIN CALCIUM 40 MG PO TABS
40.0000 mg | ORAL_TABLET | ORAL | Status: DC
Start: 2015-01-06 — End: 2015-02-15

## 2015-01-06 NOTE — Patient Instructions (Signed)
Please continue: - Metformin 1000 mg 2x a day. - Invokana 100 mg daily before breakfast  - Toujeo 75 units at bedtime.  Please increase: - NovoLog to  18 >> 20 units before breakfast 25 units before lunch  35 >> 40 units before dinner  Please return in 3 months with your sugar log.

## 2015-01-06 NOTE — Progress Notes (Signed)
Patient ID: Zachary Warner, male   DOB: 1956/03/17, 59 y.o.   MRN: [id number]  HPI: Zachary Warner is a 59 y.o.-year-old male, returning for f/u for DM2, dx in ~1995, insulin-dependent since ~2005, uncontrolled, with complications (CAD - s/p AMI, DR). Last visit 3 mo ago.  He relaxed his diet over the summer. Sugars worse.  Last hemoglobin A1c was: Lab Results  Component Value Date   HGBA1C 7.5 10/06/2014   HGBA1C 8.2* 06/22/2014   HGBA1C 8.8* 03/11/2014  He rotates the sites.   He is on: - Metformin 1000 mg 2x a day. - Invokana 100 mg daily before breakfast - started 01/2014 >> had 1 yeast inf >> Diflucan x2 pills. No more yeast inf.  - NovoLog - missed doses rarely: 18 units before breakfast 25 units before lunch  35 units before dinner - Toujeo 75 units at bedtime. We stopped Januvia 2/2 large doses of mealtime insulin.  Pt checks his sugars 1-3x a day - no log/meter - ran out of strips in last month: - am: 72-180 (most 150-160), 230 >> 95, 115-176, 186 >> 92, 101-165 >> 122-182 >> 85-154 >> 160-190 - lunch: 124-178 >> 115, 135 >> n/c >> 124 >> n/c >> 138-161 >> 201 >> 81, 136 >> 90-160 - 2h after lunch: 114-160 >> n/c >> 74 (delayed meal - post lunch), 145 >> 168 >> 130s - dinner: 74, 96, 133, 156 >> 122-199 >> 140-170 >> 113, 155, 156 >> 74-145 >> 69-141 in last 3 weeks >> 120-150 - 2h after dinner: 135, 158, 226 >> n/c >> 190 >> n/c - bedtime: 95-278, most < 190 >> 154-221 >> 200s >> n/c >>167, 205 >> n/c  - nighttime: 64, 75 No lows; lowest 95 >> 70s >> 69 >> 90; has hypoglycemia awareness at 60.  Highest sugar was 200s >> 170s >> 207 in am.  He has 2 True Result meters.  Pt's meals are: - Breakfast: grits or sandwich - Lunch: sandwich - Dinner: chicken or beef or pork + vegetables - Snacks: 1 or 2 snacks  - crackers, peanuts or cookies He walks for exercise. He also started to improve his diet.  - no CKD, last BUN/creatinine:  Lab Results  Component Value Date   BUN 18 03/11/2014   CREATININE 0.9 03/11/2014  On lisinopril. - last set of lipids: 01/05/2015: 159/236/33/79 Lab Results  Component Value Date   CHOL 227* 07/08/2013   HDL 38.70* 07/08/2013   LDLCALC 105* 07/08/2013   LDLDIRECT 79.5 03/07/2013   TRIG 415.0* 07/08/2013   CHOLHDL 6 07/08/2013  On Atorvastatin 40 mg 1 tab every 3 days. - last eye exam: Dr Ambrose Mantle [facility]  Abstract on 07/20/2014  Component Date Value Ref Range Status  . HM Diabetic Eye Exam 07/09/2014 Retinopathy* No Retinopathy Final   - no numbness and tingling in his feet.  He also has a history of HTN, HL, h/o nephrolithiasis x 3, last in 03/2013, OSA. He had an AMI 07/2014 >> stent. He feels much better now. Cardiologist: Dr Sedonia Small.  I reviewed pt's medications, allergies, PMH, social hx, family hx, and changes were documented in the history of present illness. Otherwise, unchanged from my initial visit note.   ROS: Constitutional: no weight loss/gain, no fatigue, no subjective hyperthermia/hypothermia Eyes: no blurry vision, no xerophthalmia ENT: no sore throat, no nodules palpated in throat, no dysphagia/odynophagia, no hoarseness Cardiovascular: no CP/SOB/palpitations/leg swelling Respiratory: no cough/SOB Gastrointestinal: no N/V/D/C Musculoskeletal: no muscle/no joint aches,  back pain Skin: no rashes Neurological: no tremors/numbness/tingling/dizziness  PE: BP 136/68 mmHg  Pulse 82  Temp(Src) 98 F (36.7 C) (Oral)  Resp 12  Wt 247 lb 9.6 oz (112.311 kg)  SpO2 97% Wt Readings from Last 3 Encounters:  01/06/15 247 lb 9.6 oz (112.311 kg)  10/06/14 247 lb (112.038 kg)  06/22/14 251 lb (113.853 kg)   Constitutional: overweight, in NAD Eyes: PERRLA, EOMI, no exophthalmos ENT: moist mucous membranes, no thyromegaly, no cervical lymphadenopathy Cardiovascular: RRR, No MRG Respiratory: CTA B Gastrointestinal: abdomen soft, NT, ND, BS+ Musculoskeletal: no deformities, strength  intact in all 4 Skin: moist, warm, no rashes Neurological: no tremor with outstretched hands, DTR normal in all 4  ASSESSMENT: 1. DM2, insulin-dependent, uncontrolled, with complications - CAD, s/p AMi - DR  PLAN:  1. Patient with long-standing, uncontrolled diabetes, with worsened diabetes control after his stent. I advised him to start checking sugars at bedtime, too. Will increase NovoLog with dinner as his sugars are higher in am after we decreased the dinnertime Novolog. - I suggested to:  Patient Instructions  Please continue: - Metformin 1000 mg 2x a day. - Invokana 100 mg daily before breakfast  - Toujeo 75 units at bedtime.  Please increase: - NovoLog to  18 >> 20 units before breakfast 25 units before lunch  35 >> 40 units before dinner  Please return in 3 months with your sugar log.   - continue checking sugars at different times of the day - check 3 times a day, rotating checks  - UTD with eye exams - will give him a flu shot today - check HbA1c today >> 8.3% (worse!) - Return to clinic in 3 mo with sugar log

## 2015-01-21 ENCOUNTER — Other Ambulatory Visit: Payer: Self-pay | Admitting: Internal Medicine

## 2015-01-22 ENCOUNTER — Telehealth: Payer: Self-pay | Admitting: Internal Medicine

## 2015-01-22 MED ORDER — INSULIN GLARGINE 300 UNIT/ML ~~LOC~~ SOPN: 75.0000 [IU] | PEN_INJECTOR | Freq: Every day | SUBCUTANEOUS | Status: DC

## 2015-01-22 NOTE — Telephone Encounter (Signed)
OK to call in enough for 2 mo

## 2015-01-22 NOTE — Telephone Encounter (Signed)
Please read message below and advise to dosage/boxes. Thank you.

## 2015-01-22 NOTE — Addendum Note (Signed)
Addended by: Adline MangoALLICUTT, Ashaz Robling B on: 01/22/2015 01:58 PM   Modules accepted: Orders

## 2015-01-22 NOTE — Telephone Encounter (Signed)
[facility] calling about the toujeo pt wants 2 boxes so it can last a full month but with the current dosing it makes it only a 36 day supply but will still be charged for 2 months, the dosage would need to be changed in order for him to get this as a 1 month supply

## 2015-02-15 ENCOUNTER — Encounter: Payer: Self-pay | Admitting: Family Medicine

## 2015-02-15 ENCOUNTER — Ambulatory Visit (INDEPENDENT_AMBULATORY_CARE_PROVIDER_SITE_OTHER): Payer: BLUE CROSS/BLUE SHIELD | Admitting: Family Medicine

## 2015-02-15 VITALS — BP 138/86 | HR 76 | Temp 98.0°F | Wt 250.0 lb

## 2015-02-15 DIAGNOSIS — J329 Chronic sinusitis, unspecified: Secondary | ICD-10-CM

## 2015-02-15 DIAGNOSIS — E1165 Type 2 diabetes mellitus with hyperglycemia: Secondary | ICD-10-CM

## 2015-02-15 DIAGNOSIS — I2510 Atherosclerotic heart disease of native coronary artery without angina pectoris: Secondary | ICD-10-CM | POA: Insufficient documentation

## 2015-02-15 DIAGNOSIS — I25118 Atherosclerotic heart disease of native coronary artery with other forms of angina pectoris: Secondary | ICD-10-CM

## 2015-02-15 DIAGNOSIS — E785 Hyperlipidemia, unspecified: Secondary | ICD-10-CM | POA: Diagnosis not present

## 2015-02-15 DIAGNOSIS — A499 Bacterial infection, unspecified: Secondary | ICD-10-CM

## 2015-02-15 DIAGNOSIS — I10 Essential (primary) hypertension: Secondary | ICD-10-CM

## 2015-02-15 DIAGNOSIS — B9689 Other specified bacterial agents as the cause of diseases classified elsewhere: Secondary | ICD-10-CM

## 2015-02-15 MED ORDER — ATORVASTATIN CALCIUM 40 MG PO TABS: 40.0000 mg | ORAL_TABLET | ORAL | Status: DC

## 2015-02-15 MED ORDER — AMOXICILLIN-POT CLAVULANATE 875-125 MG PO TABS: 1.0000 | ORAL_TABLET | Freq: Two times a day (BID) | ORAL | Status: DC

## 2015-02-15 NOTE — Assessment & Plan Note (Signed)
S: poorly controlled on atorvastatin 20mg  every other day (due to LDL goal <70 with CAD). No myalgias.  Lab Results  Component Value Date   CHOL 159 01/05/2015   HDL 33* 01/05/2015   TRIG 161236* 01/05/2015   LDL 79- to be also abstracted 07/08/2013   A/P: patient will attempt a full dose of the 20mg  daily at this point- previously had myalgias on daily dose

## 2015-02-15 NOTE — Progress Notes (Signed)
[name], MD  Subjective:  Zachary Warner is a 59 y.o. year old very pleasant male patient who presents for/with See problem oriented charting ROS- short lived chest pain or shortness of breath after taking nitroglcyerin twice in last 6 months. No headache or blurry vision.    Past Medical History-  Patient Active Problem List   Diagnosis Date Noted  . CAD (coronary artery disease) 02/15/2015    Priority: High  . Type 2 diabetes mellitus with hyperglycemia (HCC) 02/04/2006    Priority: High  . Hyperlipidemia 02/04/2006    Priority: Medium  . Essential hypertension 02/04/2006    Priority: Medium  . 6Th nerve palsy 02/26/2012    Priority: Low  . RECTAL FISSURE 06/05/2006    Priority: Low    Medications- reviewed and updated Current Outpatient Prescriptions  Medication Sig Dispense Refill  . aspirin 81 MG tablet Take 162 mg by mouth daily.     Marland Kitchen atorvastatin (LIPITOR) 40 MG tablet Take 1 tablet (40 mg total) by mouth every other day. 90 tablet 3  . azelastine (ASTELIN) 137 MCG/SPRAY nasal spray Place 1 spray into the nose 2 (two) times daily. Use in each nostril as directed 30 mL 11  . canagliflozin (INVOKANA) 100 MG TABS tablet Take 1 tablet (100 mg total) by mouth daily. 30 tablet 2  . carvedilol (COREG) 3.125 MG tablet Take 3.125 mg by mouth 2 (two) times daily.  11  . diltiazem 2 % GEL Apply 1 application topically 2 (two) times daily as needed. 30 g 11  . EFFIENT 10 MG TABS tablet Take 10 mg by mouth daily.  11  . glucose blood test strip 1 each by Other route as needed. Use as instructed     . insulin aspart (NOVOLOG) 100 UNIT/ML FlexPen INJECT 20 UNITS SUBQUE WITH A SMALL MEAL, 25 UNITS WITH A REGULAR MEAL AND 35 UNITS WITH A LARGE MEAL AS DIRECTED. 30 mL 2  . Insulin Glargine (TOUJEO SOLOSTAR) 300 UNIT/ML SOPN Inject 75 Units into the skin at bedtime. 10 pen 1  . Insulin Pen Needle (CLICKFINE PEN NEEDLES) 32G X 4 MM MISC Use 3x a day 200 each 11  . Insulin  Syringe-Needle U-100 (BD INSULIN SYRINGE ULTRAFINE) 31G X 15/64" 1 ML MISC Use once a day 200 each 11  . lisinopril (PRINIVIL,ZESTRIL) 40 MG tablet TAKE ONE TABLET BY MOUTH ONCE DAILY 90 tablet 2  . metFORMIN (GLUCOPHAGE) 1000 MG tablet TAKE 1 TABLET BY MOUTH TWICE DAILY WITH MEALS. 180 tablet 1  . amoxicillin-clavulanate (AUGMENTIN) 875-125 MG tablet Take 1 tablet by mouth 2 (two) times daily. 14 tablet 0   No current facility-administered medications for this visit.    Objective: BP 138/86 mmHg  Pulse 76  Temp(Src) 98 F (36.7 C)  Wt 250 lb (113.399 kg) Gen: NAD, resting comfortably CV: RRR no murmurs rubs or gallops Lungs: CTAB no crackles, wheeze, rhonchi Abdomen: soft/nontender/nondistended/normal bowel sounds. No rebound or guarding. obese Ext: no edema Skin: warm, dry Neuro: grossly normal, moves all extremities  Diabetic Foot Exam - Simple   Simple Foot Form  Diabetic Foot exam was performed with the following findings:  Yes 02/15/2015  6:34 PM  Visual Inspection  No deformities, no ulcerations, no other skin breakdown bilaterally:  Yes  Sensation Testing  Intact to touch and monofilament testing bilaterally:  Yes  Pulse Check  Posterior Tibialis and Dorsalis pulse intact bilaterally:  Yes  Comments     Assessment/Plan:  Hyperlipidemia S: poorly  controlled on atorvastatin 20mg  every other day (due to LDL goal <70 with CAD). No myalgias.  Lab Results  Component Value Date   CHOL 159 01/05/2015   HDL 33* 01/05/2015   TRIG [id number]* 01/05/2015   LDL 79- to be also abstracted 07/08/2013   A/P: patient will attempt a full dose of the 20mg  daily at this point- previously had myalgias on daily dose    Essential hypertension S: controlled. On Coreg 3.125mg  BID, lisinopril 40mg   BP Readings from Last 3 Encounters:  02/15/15 138/86  01/06/15 136/68  10/06/14 138/70  A/P:Continue current meds:  No change   Type 2 diabetes mellitus with hyperglycemia (HCC) S:Followed  by Endocrine Dr. Elvera LennoxGherghe. #s improving under their care.  Toujeo 75 units bedtime, aspart 20-35 sliding scale, invokana 100mg , metformin 1000mg  BID Lab Results  Component Value Date   HGBA1C 8.3 01/06/2015  A/P: completed foot exam today and normal- continue care under endocrine   CAD (coronary artery disease) S:CAD- MI 08/17/2014 and stent day later. effient  [facility] Jane[facility] on epic) Nitro twice in 6 months- symptoms resolved after 1-2 doses A/P: Patient states cardiology is aware of his symptoms. Appears to have stable angina. Continue current medical therapy with aspirin, statin, coreg.    Bacterial sinusitis S: 3 weeks sinus pressure, congestion, fatigue, cough.  A/P: bacterial based on duration- treat with augmentin. Follow up advised.   6 months advised Return precautions advised.   Meds ordered this encounter  Medications  . atorvastatin (LIPITOR) 40 MG tablet    Sig: Take 1 tablet (40 mg total) by mouth every other day.    Dispense:  90 tablet    Refill:  3  . amoxicillin-clavulanate (AUGMENTIN) 875-125 MG tablet    Sig: Take 1 tablet by mouth 2 (two) times daily.    Dispense:  14 tablet    Refill:  0

## 2015-02-15 NOTE — Assessment & Plan Note (Signed)
S: controlled. On Coreg 3.125mg  BID, lisinopril 40mg   BP Readings from Last 3 Encounters:  02/15/15 138/86  01/06/15 136/68  10/06/14 138/70  A/P:Continue current meds:  No change

## 2015-02-15 NOTE — Patient Instructions (Addendum)
Start augmentin for 7 days for bacterial sinus infection, no other medication changes  I would like to see you in about 6 months to check in. Depending on how things going as well as cardiology and diabetes follow up, may stretch this out at later date

## 2015-02-15 NOTE — Assessment & Plan Note (Signed)
S:CAD- MI 08/17/2014 and stent day later. effient  [facility] on epic) Nitro twice in 6 months- symptoms resolved after 1-2 doses A/P: Patient states cardiology is aware of his symptoms. Appears to have stable angina. Continue current medical therapy with aspirin, statin, coreg.

## 2015-02-15 NOTE — Assessment & Plan Note (Signed)
S:Followed by Endocrine Dr. Elvera LennoxGherghe. #s improving under their care.  Toujeo 75 units bedtime, aspart 20-35 sliding scale, invokana 100mg , metformin 1000mg  BID Lab Results  Component Value Date   HGBA1C 8.3 01/06/2015  A/P: completed foot exam today and normal- continue care under endocrine

## 2015-02-16 ENCOUNTER — Telehealth: Payer: Self-pay | Admitting: Internal Medicine

## 2015-02-16 ENCOUNTER — Encounter: Payer: Self-pay | Admitting: Family Medicine

## 2015-02-16 MED ORDER — FLUCONAZOLE 150 MG PO TABS: 150.0000 mg | ORAL_TABLET | Freq: Once | ORAL | Status: DC

## 2015-02-16 NOTE — Telephone Encounter (Signed)
Patient stated that he has a yeast infection due to the mediccation Invokana can you call in a prescription to  [facility] - [place], [place] - [place], [place] - [address] [phone] (Phone) [phone] (Fax)

## 2015-02-16 NOTE — Telephone Encounter (Signed)
OK: Diflucan 150 mg - 1 tab with 1 refill in 3 days if not resolved.

## 2015-02-16 NOTE — Telephone Encounter (Signed)
[facility]

## 2015-02-16 NOTE — Telephone Encounter (Signed)
Please read message below and advise.  

## 2015-02-23 ENCOUNTER — Other Ambulatory Visit: Payer: Self-pay | Admitting: Internal Medicine

## 2015-03-04 ENCOUNTER — Other Ambulatory Visit: Payer: Self-pay | Admitting: *Deleted

## 2015-03-04 ENCOUNTER — Telehealth: Payer: Self-pay | Admitting: Internal Medicine

## 2015-03-04 MED ORDER — INSULIN DETEMIR 100 UNIT/ML FLEXPEN: 75.0000 [IU] | PEN_INJECTOR | Freq: Every day | SUBCUTANEOUS | Status: DC

## 2015-03-04 MED ORDER — INSULIN LISPRO 100 UNIT/ML (KWIKPEN): PEN_INJECTOR | SUBCUTANEOUS | Status: DC

## 2015-03-04 NOTE — Telephone Encounter (Signed)
Called pt and lvm advising him of the change in therapy. Ins would not cover Toujeo or Novolog. Changing to Humalog and Levemir.

## 2015-03-04 NOTE — Telephone Encounter (Signed)
Patient need anew prescription  of toujeo , Novalog send to  [facility] - [place] - [place], [place] - [address] [phone] (Phone) [phone] (Fax)

## 2015-03-23 ENCOUNTER — Other Ambulatory Visit: Payer: Self-pay | Admitting: Internal Medicine

## 2015-03-24 ENCOUNTER — Other Ambulatory Visit: Payer: Self-pay | Admitting: Internal Medicine

## 2015-04-09 ENCOUNTER — Encounter: Payer: Self-pay | Admitting: Internal Medicine

## 2015-04-09 ENCOUNTER — Ambulatory Visit (INDEPENDENT_AMBULATORY_CARE_PROVIDER_SITE_OTHER): Payer: 59 | Admitting: Internal Medicine

## 2015-04-09 ENCOUNTER — Other Ambulatory Visit (INDEPENDENT_AMBULATORY_CARE_PROVIDER_SITE_OTHER): Payer: 59 | Admitting: *Deleted

## 2015-04-09 VITALS — BP 130/74 | HR 76 | Temp 98.5°F | Resp 12 | Wt 248.8 lb

## 2015-04-09 DIAGNOSIS — Z794 Long term (current) use of insulin: Secondary | ICD-10-CM | POA: Diagnosis not present

## 2015-04-09 DIAGNOSIS — E1165 Type 2 diabetes mellitus with hyperglycemia: Secondary | ICD-10-CM

## 2015-04-09 LAB — POCT GLYCOSYLATED HEMOGLOBIN (HGB A1C): HEMOGLOBIN A1C: 8.9

## 2015-04-09 NOTE — Patient Instructions (Signed)
Please continue: - Metformin 1000 mg 2x a day. - Invokana 100 mg daily before breakfast  - Levemir 75 units at bedtime. - Humalog to  20 units before breakfast 25 units before lunch  40 units before dinner  Please return in 3 months with your sugar log.   Please stop at the lab.

## 2015-04-09 NOTE — Progress Notes (Signed)
Patient ID: Zachary NorseLester W Coote, male   DOB: December 26, 1955, 60 y.o.   MRN: [id number]  HPI: Zachary Warner is a 60 y.o.-year-old male, returning for f/u for DM2, dx in ~1995, insulin-dependent since ~2005, uncontrolled, with complications (CAD - s/p AMI, DR). Last visit 3 mo ago.  Last hemoglobin A1c was: Lab Results  Component Value Date   HGBA1C 8.3 01/06/2015   HGBA1C 7.5 10/06/2014   HGBA1C 8.2* 06/22/2014  He rotates the sites.   He is on: - Metformin 1000 mg 2x a day. - Invokana 100 mg daily before breakfast  - started 01/2014 >> had 2 yeast inf since he started >> Diflucan tx. - Levemir 75 units at bedtime. - Humalog : 20 units before breakfast 25 units before lunch  40 units before dinner We stopped Januvia 2/2 large doses of mealtime insulin.  Pt checks his sugars 1-3x a day - but did not check around Christmas and New Years: - am: 95, 115-176, 186 >> 92, 101-165 >> 122-182 >> 85-154 >> 160-190 >> 111-141, 163, 170 - 2h after b'fast: >> 138-155 - lunch: 115, 135 >> n/c >> 124 >> n/c >> 138-161 >> 201 >> 81, 136 >> 90-160 >> 120-150 - 2h after lunch:74 (delayed meal - post lunch), 145 >> 168 >> 130s >> 149-198 - dinner: 122-199 >> 140-170 >> 113, 155, 156 >> 74-145 >> 69-141 in last 3 weeks >> 120-150 >>110-153 - 2h after dinner: 135, 158, 226 >> n/c >> 190 >> n/c >> 168-180 - bedtime: 95-278, most < 190 >> 154-221 >> 200s >> n/c >>167, 205 >> n/c >> 172, 187 - nighttime: 64, 75 >> n/c No lows; lowest 95 >> 70s >> 69 >> 90 >> 110; has hypoglycemia awareness at 60.  Highest sugar was 200s >> 170s >> 207 >> 190s.  He has 2 True Result meters.  Pt's meals are: - Breakfast: grits or sandwich - Lunch: sandwich - Dinner: chicken or beef or pork + vegetables - Snacks: 1 or 2 snacks  - crackers, peanuts or cookies He walks for exercise.   - no CKD, last BUN/creatinine:  Lab Results  Component Value Date   BUN 18 03/11/2014   CREATININE 0.9 03/11/2014  On lisinopril. - last set  of lipids: 01/05/2015: 159/236/33/79 Lab Results  Component Value Date   CHOL 159 01/05/2015   HDL 33* 01/05/2015   LDLCALC 79 01/05/2015   LDLDIRECT 79.5 03/07/2013   TRIG 236* 01/05/2015   CHOLHDL 6 07/08/2013  On Atorvastatin 40 mg 4x a week, added Fish oil.  Latest LFTs: 01/05/2015: AST 12, ALT 18 - [facility]  Abstract on 07/20/2014  Component Date Value Ref Range Status  . HM Diabetic Eye Exam 07/09/2014 Retinopathy* No Retinopathy Final   - no numbness and tingling in his feet.  He also has a history of HTN, HL, h/o nephrolithiasis x 3, last in 03/2013, OSA. He had an AMI 07/2014 >> stent. He feels much better now. Cardiologist: Dr Sedonia SmallKrosowski.  I reviewed pt's medications, allergies, PMH, social hx, family hx, and changes were documented in the history of present illness. Otherwise, unchanged from my initial visit note.   ROS: Constitutional: no weight loss/gain, no fatigue, no subjective hyperthermia/hypothermia Eyes: no blurry vision, no xerophthalmia ENT: no sore throat, no nodules palpated in throat, no dysphagia/odynophagia, no hoarseness Cardiovascular: no CP/SOB/palpitations/leg swelling Respiratory: no cough/SOB Gastrointestinal: no N/V/D/C Musculoskeletal: no muscle/no joint aches Skin: no rashes Neurological: no tremors/numbness/tingling/dizziness  PE: BP 130/74 mmHg  Pulse 76  Temp(Src) 98.5 F (36.9 C) (Oral)  Resp 12  Wt 248 lb 12.8 oz (112.855 kg)  SpO2 96% Body mass index is 35.7 kg/(m^2). Wt Readings from Last 3 Encounters:  04/09/15 248 lb 12.8 oz (112.855 kg)  02/15/15 250 lb (113.399 kg)  01/06/15 247 lb 9.6 oz (112.311 kg)   Constitutional: overweight, in NAD Eyes: PERRLA, EOMI, no exophthalmos ENT: moist mucous membranes, no thyromegaly, no cervical lymphadenopathy Cardiovascular: RRR, No MRG Respiratory: CTA B Gastrointestinal: abdomen soft, NT, ND, BS+ Musculoskeletal: no deformities, strength  intact in all 4 Skin: moist, warm, no rashes Neurological: no tremor with outstretched hands, DTR normal in all 4  ASSESSMENT: 1. DM2, insulin-dependent, uncontrolled, with complications - CAD, s/p AMi - DR  PLAN:  1. Patient with long-standing, uncontrolled diabetes, with slightly better sugars in the last week but he did not check over the Holidays. He will start watching his diet more and start exercising. Will not change his regimen. - I suggested to:  Patient Instructions  Please continue: - Metformin 1000 mg 2x a day. - Invokana 100 mg daily before breakfast  - Levemir 75 units at bedtime. - Humalog to  20 units before breakfast 25 units before lunch  40 units before dinner  Please return in 3 months with your sugar log.   Please stop at the lab.  - continue checking sugars at different times of the day - check 3 times a day, rotating checks  - UTD with eye exams - given flu shot at last visit - will check BMP today  - check HbA1c today >> 8.9% (higher). This is in contrast with his log >> will check a fructosamine, also. - Return to clinic in 3 mo with sugar log   Component     Latest Ref Rng 04/09/2015  Fructosamine     190 - 270 umol/L 326 (H)  HbA1c calculated back from fructosamine: 7.1%, which is much more concordant with his sugar log. From now on, we will probably need to only check fructosamine levels for him.

## 2015-04-12 LAB — FRUCTOSAMINE: Fructosamine: 326 umol/L — ABNORMAL HIGH (ref 190–270)

## 2015-05-21 ENCOUNTER — Other Ambulatory Visit: Payer: Self-pay | Admitting: Internal Medicine

## 2015-06-10 ENCOUNTER — Other Ambulatory Visit: Payer: Self-pay | Admitting: Internal Medicine

## 2015-06-11 ENCOUNTER — Telehealth: Payer: Self-pay | Admitting: Internal Medicine

## 2015-06-11 NOTE — Telephone Encounter (Signed)
Pt said that he needed to talk to someone about his yeast infection medication

## 2015-06-15 NOTE — Telephone Encounter (Signed)
Tried to call pt. Unable to lvm.

## 2015-06-16 MED ORDER — FLUCONAZOLE 150 MG PO TABS: 150.0000 mg | ORAL_TABLET | Freq: Once | ORAL | Status: DC

## 2015-06-16 NOTE — Telephone Encounter (Signed)
Pt returned call. He has another yeast infection. Pt stated that it is probably the Invokana. He said he can discuss with you when he comes in for his follow up about changing. In the meantime, can we send in diflucan for him? Please advise.

## 2015-06-16 NOTE — Telephone Encounter (Signed)
Yes, Diflucan 150 mg x1 with 2 refills

## 2015-06-21 ENCOUNTER — Other Ambulatory Visit: Payer: Self-pay | Admitting: Internal Medicine

## 2015-06-21 ENCOUNTER — Other Ambulatory Visit: Payer: Self-pay | Admitting: Family Medicine

## 2015-07-12 ENCOUNTER — Ambulatory Visit: Payer: BC Managed Care – PPO | Admitting: Endocrinology

## 2015-08-10 ENCOUNTER — Encounter: Payer: Self-pay | Admitting: Internal Medicine

## 2015-08-10 ENCOUNTER — Ambulatory Visit (INDEPENDENT_AMBULATORY_CARE_PROVIDER_SITE_OTHER): Payer: 59 | Admitting: Internal Medicine

## 2015-08-10 VITALS — BP 130/72 | HR 76 | Temp 98.0°F | Resp 12 | Wt 250.0 lb

## 2015-08-10 DIAGNOSIS — Z794 Long term (current) use of insulin: Secondary | ICD-10-CM

## 2015-08-10 DIAGNOSIS — E1165 Type 2 diabetes mellitus with hyperglycemia: Secondary | ICD-10-CM | POA: Insufficient documentation

## 2015-08-10 LAB — COMPLETE METABOLIC PANEL WITH GFR
ALBUMIN: 4.2 g/dL (ref 3.6–5.1)
ALK PHOS: 48 U/L (ref 40–115)
ALT: 18 U/L (ref 9–46)
AST: 13 U/L (ref 10–35)
BILIRUBIN TOTAL: 0.8 mg/dL (ref 0.2–1.2)
BUN: 23 mg/dL (ref 7–25)
CO2: 24 mmol/L (ref 20–31)
Calcium: 9.9 mg/dL (ref 8.6–10.3)
Chloride: 101 mmol/L (ref 98–110)
Creat: 0.93 mg/dL (ref 0.70–1.25)
GFR, Est African American: >89 mL/min (ref >=60)
GFR, Est Non African American: 89 mL/min (ref >=60)
GLUCOSE: 200 mg/dL — AB (ref 65–99)
POTASSIUM: 4.7 mmol/L (ref 3.5–5.3)
SODIUM: 136 mmol/L (ref 135–146)
TOTAL PROTEIN: 6.6 g/dL (ref 6.1–8.1)

## 2015-08-10 MED ORDER — INSULIN LISPRO 100 UNIT/ML (KWIKPEN): PEN_INJECTOR | SUBCUTANEOUS | Status: DC

## 2015-08-10 MED ORDER — INSULIN DETEMIR 100 UNIT/ML FLEXPEN
PEN_INJECTOR | SUBCUTANEOUS | Status: DC
Start: 2015-08-10 — End: 2015-09-17

## 2015-08-10 MED ORDER — CANAGLIFLOZIN 300 MG PO TABS: 300.0000 mg | ORAL_TABLET | Freq: Every day | ORAL | Status: DC

## 2015-08-10 NOTE — Patient Instructions (Signed)
Please increase: - Levemir to 80 units nightly.  Please increase: - Invokana to 300 mg in am  Please continue: - Metformin 1000 mg 2x a day. - Humalog: 20 units before breakfast 28 units before lunch  42 units before dinner  Please return in 3 months with your sugar log.

## 2015-08-10 NOTE — Progress Notes (Signed)
Patient ID: Zachary Warner, male   DOB: 1955-05-15, 60 y.o.   MRN: [id number]  HPI: Zachary Warner is a 60 y.o.-year-old male, returning for f/u for DM2, dx in ~1995, insulin-dependent since ~2005, uncontrolled, with complications (CAD - s/p AMI, DR). Last visit 4 mo ago.  Last hemoglobin A1c was: 04/09/2015: HbA1c calculated fructosamine (326): 7.1%, which is much more concordant with his sugar log.  Lab Results  Component Value Date   HGBA1C 8.9 04/09/2015   HGBA1C 8.3 01/06/2015   HGBA1C 7.5 10/06/2014  He rotates the sites.   He is on: - Metformin 1000 mg 2x a day. - Invokana 100 mg daily before breakfast  - started 01/2014 >> had 3 yeast inf since he started >> Diflucan tx. - Levemir 75 units at bedtime. - Humalog: 20 units before breakfast 28 units before lunch  42 units before dinner We stopped Januvia 2/2 large doses of mealtime insulin.  Pt checks his sugars 1-3x a day: - am: 92, 101-165 >> 122-182 >> 85-154 >> 160-190 >> 111-141, 163, 170 >> 130-180, 200 (eats dinner late) - 2h after b'fast: >> 138-155 >> n/c - lunch: 115, 135 >> n/c >> 124 >> n/c >> 138-161 >> 201 >> 81, 136 >> 90-160 >> 120-150 >> 160s - 2h after lunch:74 (delayed meal - post lunch), 145 >> 168 >> 130s >> 149-198 >> n/c - dinner: 140-170 >> 113, 155, 156 >> 74-145 >> 69-141 in last 3 weeks >> 120-150 >> 110-153 >> 140-150 - 2h after dinner: 135, 158, 226 >> n/c >> 190 >> n/c >> 168-180 >> 160-180 - bedtime: 95-278, most < 190 >> 154-221 >> 200s >> n/c >>167, 205 >> n/c >> 172, 187 >> n/c - nighttime: 64, 75 >> n/c No lows; lowest 95 >> 70s >> 69 >> 90 >> 110 >> ; has hypoglycemia awareness at 60.  Highest sugar was 200s >> 170s >> 207 >> 190s >> .  He has 2 True Result meters.  Pt's meals are: - Breakfast: grits or sandwich - Lunch: sandwich - Dinner: chicken or beef or pork + vegetables - Snacks: 1 or 2 snacks  - crackers, peanuts or cookies He walks for exercise.   - no CKD, last  BUN/creatinine:  Lab Results  Component Value Date   BUN 18 03/11/2014   CREATININE 0.9 03/11/2014  On lisinopril.  - last set of lipids: 01/05/2015: 159/236/33/79 Lab Results  Component Value Date   CHOL 159 01/05/2015   HDL 33* 01/05/2015   LDLCALC 79 01/05/2015   LDLDIRECT 79.5 03/07/2013   TRIG 236* 01/05/2015   CHOLHDL 6 07/08/2013  On Atorvastatin 40 mg 4x a week, added Fish oil.  Latest LFTs: 01/05/2015: AST 12, ALT 18  - last eye exam: Dr Ulanda Edison ([facility]  Abstract on 07/20/2014  Component Date Value Ref Range Status  . HM Diabetic Eye Exam 07/09/2014 Retinopathy* No Retinopathy Final   - no numbness and tingling in his feet.  He also has a history of HTN, HL, h/o nephrolithiasis x 3, last in 03/2013, OSA. He had an AMI 07/2014 >> stent. He feels much better now. Cardiologist: Dr Jeryl Columbia.  I reviewed pt's medications, allergies, PMH, social hx, family hx, and changes were documented in the history of present illness. Otherwise, unchanged from my initial visit note.   ROS: Constitutional: no weight loss/gain, no fatigue, no subjective hyperthermia/hypothermia Eyes: no blurry vision, no xerophthalmia ENT: no sore throat, no nodules palpated in throat, no  dysphagia/odynophagia, no hoarseness Cardiovascular: no CP/SOB/palpitations/leg swelling Respiratory: no cough/SOB Gastrointestinal: no N/V/D/C Musculoskeletal: no muscle/no joint aches Skin: no rashes Neurological: no tremors/numbness/tingling/dizziness  PE: BP 130/72 mmHg  Pulse 76  Temp(Src) 98 F (36.7 C) (Oral)  Resp 12  Wt 250 lb (113.399 kg)  SpO2 96% Body mass index is 35.87 kg/(m^2). Wt Readings from Last 3 Encounters:  08/10/15 250 lb (113.399 kg)  04/09/15 248 lb 12.8 oz (112.855 kg)  02/15/15 250 lb (113.399 kg)   Constitutional: overweight, in NAD Eyes: PERRLA, EOMI, no exophthalmos ENT: moist mucous membranes, no thyromegaly, no cervical lymphadenopathy Cardiovascular: RRR,  No MRG Respiratory: CTA B Gastrointestinal: abdomen soft, NT, ND, BS+ Musculoskeletal: no deformities, strength intact in all 4 Skin: moist, warm, no rashes Neurological: no tremor with outstretched hands, DTR normal in all 4  ASSESSMENT: 1. DM2, insulin-dependent, uncontrolled, with complications - CAD, s/p AMi - DR  PLAN:  1. Patient with long-standing, uncontrolled diabetes, with worsened control since last visit control due to dietary indiscretions and eating late. Last HbA1c 7.1%.  - He had several yeast infections likely from [facility]. These were treated with Diflucan. He would like to continue the [facility] for now. - as all sugars elevated >> will increase Levemir and Invokana. - I suggested to:  Patient Instructions  Please increase: - Levemir to 80 units nightly.  Please increase: - Invokana to 300 mg in am  Please continue: - Metformin 1000 mg 2x a day. - Humalog: 20 units before breakfast 28 units before lunch  42 units before dinner  Please return in 3 months with your sugar log.   - continue checking sugars at different times of the day - check 3 times a day, rotating checks  - Needs a new eye exam - given flu shot this season - will check CMP and Fructosamine today  - Return to clinic in 3 mo with sugar log   Office Visit on 08/10/2015  Component Date Value Ref Range Status  . Sodium 08/10/2015 136  135 - 146 mmol/L Final  . Potassium 08/10/2015 4.7  3.5 - 5.3 mmol/L Final  . Chloride 08/10/2015 101  98 - 110 mmol/L Final  . CO2 08/10/2015 24  20 - 31 mmol/L Final  . Glucose, Bld 08/10/2015 200* 65 - 99 mg/dL Final  . BUN 08/10/2015 23  7 - 25 mg/dL Final  . Creat 08/10/2015 0.93  0.70 - 1.25 mg/dL Final  . Total Bilirubin 08/10/2015 0.8  0.2 - 1.2 mg/dL Final  . Alkaline Phosphatase 08/10/2015 48  40 - 115 U/L Final  . AST 08/10/2015 13  10 - 35 U/L Final  . ALT 08/10/2015 18  9 - 46 U/L Final  . Total Protein 08/10/2015 6.6  6.1 - 8.1 g/dL Final  .  Albumin 08/10/2015 4.2  3.6 - 5.1 g/dL Final  . Calcium 08/10/2015 9.9  8.6 - 10.3 mg/dL Final  . GFR, Est African American 08/10/2015 >89  >=60 mL/min Final  . GFR, Est Non African American 08/10/2015 89  >=60 mL/min Final   Comment:   The estimated GFR is a calculation valid for adults (>=28 years old) that uses the CKD-EPI algorithm to adjust for age and sex. It is   not to be used for children, pregnant women, hospitalized patients,    patients on dialysis, or with rapidly changing kidney function. According to the NKDEP, eGFR >89 is normal, 60-89 shows mild impairment, 30-59 shows moderate impairment, 15-29 shows severe impairment and <15  is ESRD.     Marland Kitchen Fructosamine 08/10/2015 320* 0 - 285 umol/L Final   Comment: Published reference interval for apparently healthy subjects between age 31 and 11 is 81 - 285 umol/L and in a poorly controlled diabetic population is 228 - 563 umol/L with a mean of 396 umol/L.    Kidney and liver tests are normal. Glucose is high, at 200. HbA1c calculated from fructosamine is 7.05%.

## 2015-08-11 LAB — FRUCTOSAMINE: FRUCTOSAMINE: 320 umol/L — AB (ref 0–285)

## 2015-08-16 ENCOUNTER — Ambulatory Visit (INDEPENDENT_AMBULATORY_CARE_PROVIDER_SITE_OTHER): Payer: 59 | Admitting: Family Medicine

## 2015-08-16 ENCOUNTER — Encounter: Payer: Self-pay | Admitting: Family Medicine

## 2015-08-16 VITALS — BP 120/76 | HR 78 | Temp 98.8°F | Ht 70.0 in | Wt 250.0 lb

## 2015-08-16 DIAGNOSIS — I10 Essential (primary) hypertension: Secondary | ICD-10-CM

## 2015-08-16 DIAGNOSIS — N200 Calculus of kidney: Secondary | ICD-10-CM

## 2015-08-16 DIAGNOSIS — I25118 Atherosclerotic heart disease of native coronary artery with other forms of angina pectoris: Secondary | ICD-10-CM | POA: Diagnosis not present

## 2015-08-16 DIAGNOSIS — E785 Hyperlipidemia, unspecified: Secondary | ICD-10-CM

## 2015-08-16 MED ORDER — HYDROCODONE-ACETAMINOPHEN 5-325 MG PO TABS: 1.0000 | ORAL_TABLET | Freq: Four times a day (QID) | ORAL | Status: DC | PRN

## 2015-08-16 NOTE — Progress Notes (Signed)
Subjective:  Zachary Warner is a 60 y.o. year old very pleasant male patient who presents for/with See problem oriented charting ROS- rare chest pain in last 6 months- resolves on nitroglycerin, no shortness of breath. Has had some spells of back pain- improved with seeing chiropractor.see any ROS included in HPI as well.   Past Medical History-  Patient Active Problem List   Diagnosis Date Noted  . CAD (coronary artery disease) 02/15/2015    Priority: High  . Type 2 diabetes mellitus with hyperglycemia (HCC) 02/04/2006    Priority: High  . Hyperlipidemia 02/04/2006    Priority: Medium  . Essential hypertension 02/04/2006    Priority: Medium  . 6Th nerve palsy 02/26/2012    Priority: Low  . RECTAL FISSURE 06/05/2006    Priority: Low  . Type 2 diabetes mellitus with hyperglycemia, with long-term current use of insulin (HCC) 08/10/2015    Medications- reviewed and updated Current Outpatient Prescriptions  Medication Sig Dispense Refill  . aspirin 81 MG tablet Take 81 mg by mouth daily.     Marland Kitchen. atorvastatin (LIPITOR) 40 MG tablet Take 1 tablet (40 mg total) by mouth every other day. 90 tablet 3  . canagliflozin (INVOKANA) 300 MG TABS tablet Take 1 tablet (300 mg total) by mouth daily before breakfast. 30 tablet 3  . carvedilol (COREG) 3.125 MG tablet Take 3.125 mg by mouth 2 (two) times daily.  11  . EFFIENT 10 MG TABS tablet Take 10 mg by mouth daily.  11  . fluticasone (FLONASE) 50 MCG/ACT nasal spray Place into both nostrils daily as needed for allergies or rhinitis.    Marland Kitchen. glucose blood test strip 1 each by Other route as needed. Use as instructed     . Insulin Detemir (LEVEMIR FLEXTOUCH) 100 UNIT/ML Pen Inject 80 Units into the skin daily at 10 pm. 30 mL 1  . insulin lispro (HUMALOG KWIKPEN) 100 UNIT/ML KiwkPen INJECT 20 UNITS INTO THE SKIN WITH A SMALL MEAL, 28 UNITS WITH A REGULAR MEAL AND 42 UNITS WITH A LARGE MEAL. 30 mL 2  . Insulin Pen Needle (CLICKFINE PEN NEEDLES) 32G X 4 MM  MISC Use 3x a day 200 each 11  . Insulin Syringe-Needle U-100 (BD INSULIN SYRINGE ULTRAFINE) 31G X 15/64" 1 ML MISC Use once a day 200 each 11  . lisinopril (PRINIVIL,ZESTRIL) 40 MG tablet TAKE ONE TABLET BY MOUTH ONCE DAILY 90 tablet 3  . metFORMIN (GLUCOPHAGE) 1000 MG tablet TAKE 1 TABLET BY MOUTH TWICE DAILY WITH MEALS. 180 tablet 0  . Omega-3 Fatty Acids (FISH OIL) 1000 MG CAPS Take 4 capsules by mouth daily.     No current facility-administered medications for this visit.    Objective: BP 120/76 mmHg  Pulse 78  Temp(Src) 98.8 F (37.1 C) (Oral)  Ht 5\' 10"  (1.778 m)  Wt 250 lb (113.399 kg)  BMI 35.87 kg/m2  SpO2 98% Gen: NAD, resting comfortably Irritated area in ear canal (used q tip this morning)- slight dried blood on exterior ear- advised patient not to use q tips CV: RRR no murmurs rubs or gallops Lungs: CTAB no crackles, wheeze, rhonchi Abdomen: soft/nontender/nondistended/normal bowel sounds. Obese Ext: no edema Skin: warm, dry Neuro: grossly normal, moves all extremities  Assessment/Plan:  CAD (coronary artery disease) S: CAD with MI 07/2014 and stent the day after. Takes effient. Follows in [place] with Dr. Genevieve NorlanderKraswokski. Last visit 6 months had twice used nitro. This visit- states only taken twice in last 6 months once again.  A/P: has stable angina. Continue aspirin, statin, coreg. Will be coming off effient and continuing aspirin at anniversary of of stent.   Essential hypertension S: controlled. On coreg 3.125mg  BID, lisinopril .   BP Readings from Last 3 Encounters:  08/16/15 120/76  08/10/15 130/72  04/09/15 130/74  A/P:no change, looks great  Hyperlipidemia S: mildly poorly controlled on  atorvastatin every other day. We tried daily but patient had recurrent myalgias in his legs and back on full dose. No myalgias.  Lab Results  Component Value Date   CHOL 159 01/05/2015   HDL 33* 01/05/2015   LDLCALC 79 01/05/2015   LDLDIRECT 79.5 03/07/2013   TRIG  236* 01/05/2015   CHOLHDL 6 07/08/2013   A/P: wish we could get LDL below 70 but cannot tolerate the full dose atorvastatin. We also discussed weight goal of 225 which would certainly help #s- as well as increasing exercise.   Nephrolithiasis Will give #5 to keep on hand of hydrocodone given 3 kidney stones in 2007-2015 range. No recent- push fluids.    Return in about 6 months (around 02/16/2016) for physical. schedule fasting labs a few days before physical as well. . Return precautions advised.   Meds ordered this encounter  Medications  . fluticasone (FLONASE) 50 MCG/ACT nasal spray    Sig: Place into both nostrils daily as needed for allergies or rhinitis.   [name], MD

## 2015-08-16 NOTE — Assessment & Plan Note (Signed)
S: controlled. On coreg 3.125mg  BID, lisinopril 40mg .   BP Readings from Last 3 Encounters:  08/16/15 120/76  08/10/15 130/72  04/09/15 130/74  A/P:no change, looks great

## 2015-08-16 NOTE — Patient Instructions (Addendum)
Health Maintenance Due  Topic Date Due  . OPHTHALMOLOGY EXAM - schedule eye exam and have them send us results like last year 07/09/2015  . ZOSTAVAX - declined for now 08/08/2015   No changes in medicine  Big thing- move the needle on the weight closer to that 225- I think that's a great goal

## 2015-08-16 NOTE — Assessment & Plan Note (Signed)
S: mildly poorly controlled on 20mg  atorvastatin every other day. We tried daily but patient had recurrent myalgias in his legs and back on full dose. No myalgias.  Lab Results  Component Value Date   CHOL 159 01/05/2015   HDL 33* 01/05/2015   LDLCALC 79 01/05/2015   LDLDIRECT 79.5 03/07/2013   TRIG 236* 01/05/2015   CHOLHDL 6 07/08/2013   A/P: wish we could get LDL below 70 but cannot tolerate the full dose atorvastatin. We also discussed weight goal of 225 which would certainly help #s- as well as increasing exercise.

## 2015-08-16 NOTE — Assessment & Plan Note (Signed)
Will give #5 to keep on hand of hydrocodone given 3 kidney stones in 2007-2015 range. No recent- push fluids.

## 2015-08-16 NOTE — Assessment & Plan Note (Signed)
S: CAD with MI 07/2014 and stent the day after. Takes effient. Follows in [place] with Dr. Genevieve NorlanderKraswokski. Last visit 6 months had twice used nitro. This visit- states only taken twice in last 6 months once again.  A/P: has stable angina. Continue aspirin, statin, coreg. Will be coming off effient and continuing aspirin at anniversary of of stent.

## 2015-08-19 ENCOUNTER — Other Ambulatory Visit: Payer: Self-pay | Admitting: Internal Medicine

## 2015-09-17 ENCOUNTER — Telehealth: Payer: Self-pay | Admitting: Internal Medicine

## 2015-09-17 ENCOUNTER — Other Ambulatory Visit: Payer: Self-pay | Admitting: Internal Medicine

## 2015-09-17 MED ORDER — INSULIN DETEMIR 100 UNIT/ML FLEXPEN: PEN_INJECTOR | SUBCUTANEOUS | Status: DC

## 2015-09-17 NOTE — Telephone Encounter (Signed)
Pt needs levemir called to [facility]

## 2015-09-17 NOTE — Telephone Encounter (Signed)
Spoke to pt. He does 80 units a day. He was having to scale back due to running out early. At 80 units he uses 2400 units a month. So, I changed the rx for 10 pens. That will be 3000 units a month.

## 2015-09-21 ENCOUNTER — Other Ambulatory Visit: Payer: Self-pay | Admitting: Internal Medicine

## 2015-09-21 NOTE — Telephone Encounter (Signed)
Refill request for metformin sent to patient pharmacy. Patient notified of medication, no questions or concerns.

## 2015-10-07 ENCOUNTER — Other Ambulatory Visit: Payer: Self-pay | Admitting: Internal Medicine

## 2015-10-21 ENCOUNTER — Other Ambulatory Visit: Payer: Self-pay | Admitting: Internal Medicine

## 2015-11-01 ENCOUNTER — Other Ambulatory Visit: Payer: Self-pay | Admitting: Internal Medicine

## 2015-11-10 ENCOUNTER — Ambulatory Visit (INDEPENDENT_AMBULATORY_CARE_PROVIDER_SITE_OTHER): Payer: 59 | Admitting: Internal Medicine

## 2015-11-10 ENCOUNTER — Encounter: Payer: Self-pay | Admitting: Internal Medicine

## 2015-11-10 VITALS — BP 142/80 | HR 74 | Ht 70.0 in | Wt 250.0 lb

## 2015-11-10 DIAGNOSIS — Z794 Long term (current) use of insulin: Secondary | ICD-10-CM | POA: Diagnosis not present

## 2015-11-10 DIAGNOSIS — E1165 Type 2 diabetes mellitus with hyperglycemia: Secondary | ICD-10-CM | POA: Diagnosis not present

## 2015-11-10 MED ORDER — CANAGLIFLOZIN 300 MG PO TABS: 300.0000 mg | ORAL_TABLET | Freq: Every day | ORAL | 5 refills | Status: DC

## 2015-11-10 MED ORDER — INSULIN DETEMIR 100 UNIT/ML FLEXPEN: PEN_INJECTOR | SUBCUTANEOUS | 3 refills | Status: DC

## 2015-11-10 MED ORDER — INSULIN LISPRO 100 UNIT/ML (KWIKPEN): PEN_INJECTOR | SUBCUTANEOUS | 2 refills | Status: DC

## 2015-11-10 NOTE — Progress Notes (Signed)
Patient ID: Zachary Warner, male   DOB: 1956/02/09, 60 y.o.   MRN: [id number]  HPI: Zachary Warner is a 60 y.o.-year-old male, returning for f/u for DM2, dx in ~1995, insulin-dependent since ~2005, uncontrolled, with complications (CAD - s/p AMI, DR). Last visit 3 mo ago.  Last hemoglobin A1c was: 08/10/2015: HbA1c calculated from fructosamine is 7.05%. 04/09/2015: HbA1c calculated fructosamine (326): 7.1%, which is much more concordant with his sugar log.  Lab Results  Component Value Date   HGBA1C 8.9 04/09/2015   HGBA1C 8.3 01/06/2015   HGBA1C 7.5 10/06/2014  He rotates the sites.   He is on: - Metformin 1000 mg 2x a day. - Invokana 300 mg daily before breakfast  - started med 01/2014 >> had 4 yeast inf since he started >> Diflucan tx. - Levemir 75 >> 78 units at bedtime. - Humalog: 20 units before breakfast 28 units before lunch  42 units before dinner We stopped Januvia 2/2 large doses of mealtime insulin.  Pt checks his sugars 1-3x a day - forgot log; no meter: - am: 92, 101-165 >> 122-182 >> 85-154 >> 160-190 >> 111-141, 163, 170 >> 130-180, 200 (eats dinner late) >> 117-174 - 2h after b'fast: >> 138-155 >> n/c - lunch: 115, 135 >> n/c >> 124 >> n/c >> 138-161 >> 201 >> 81, 136 >> 90-160 >> 120-150 >> 160s >> 130-160 - 2h after lunch:74 (delayed meal - post lunch), 145 >> 168 >> 130s >> 149-198 >> n/c - dinner: 113, 155, 156 >> 74-145 >> 69-141 in last 3 weeks >> 120-150 >> 110-153 >> 140-150 >> 150-170 (snack at 3 pm) - 2h after dinner: 135, 158, 226 >> n/c >> 190 >> n/c >> 168-180 >> 160-180 - bedtime: 95-278, most < 190 >> 154-221 >> 200s >> n/c >>167, 205 >> n/c >> 172, 187 >> n/c - nighttime: 64, 75 >> n/c No lows; lowest 95 >> 70s >> 69 >> 90 >> 110 >> 60 x1 (night) - more active the day before, did not eat well, 90s; has hypoglycemia awareness at 60.  Highest sugar was 200s >> 170s >> 207 >> 190s >> 200 rarely after a meal.  He has a ReliOn meter.  Pt's meals  are: - Breakfast: grits or sandwich - Lunch: sandwich - Dinner: chicken or beef or pork + vegetables - Snacks: 1 or 2 snacks  - crackers, peanuts or cookies He walks for exercise.   - no CKD, last BUN/creatinine:  Lab Results  Component Value Date   BUN 23 08/10/2015   CREATININE 0.93 08/10/2015  On lisinopril.  - last set of lipids: Lab Results  Component Value Date   CHOL 159 01/05/2015   HDL 33 (A) 01/05/2015   LDLCALC 79 01/05/2015   LDLDIRECT 79.5 03/07/2013   TRIG 236 (A) 01/05/2015   CHOLHDL 6 07/08/2013  On Atorvastatin 40 mg 4x a week, added Fish oil.  Latest LFTs: 01/05/2015: AST 12, ALT 18  - last [facility]: [facility]  Abstract on 07/20/2014  Component Date Value Ref Range Status  . HM Diabetic Eye Exam 07/09/2014 Retinopathy* No Retinopathy Final   - no numbness and tingling in his feet.  He also has a history of HTN, HL, h/o nephrolithiasis x 3, last in 03/2013, OSA. He had an AMI 07/2014 >> stent. He feels much better now. Cardiologist: Dr Sedonia SmallKrosowski.  I reviewed pt's medications, allergies, PMH, social hx, family hx, and changes were documented in the  history of present illness. Otherwise, unchanged from my initial visit note.   ROS: Constitutional: no weight loss/gain, no fatigue, no subjective hyperthermia/hypothermia Eyes: no blurry vision, no xerophthalmia ENT: no sore throat, no nodules palpated in throat, no dysphagia/odynophagia, no hoarseness Cardiovascular: no CP/SOB/palpitations/leg swelling Respiratory: no cough/SOB Gastrointestinal: no N/V/D/C Musculoskeletal: no muscle/no joint aches Skin: no rashes Neurological: no tremors/numbness/tingling/dizziness  PE: BP (!) 142/80 (BP Location: Right Arm, Patient Position: Sitting)   Pulse 74   Ht 5\' 10"  (1.778 m)   Wt 250 lb (113.4 kg)   SpO2 95%   BMI 35.87 kg/m  Body mass index is 35.87 kg/m. Wt Readings from Last 3 Encounters:  11/10/15 250 lb (113.4 kg)  08/16/15  250 lb (113.4 kg)  08/10/15 250 lb (113.4 kg)   Constitutional: overweight, in NAD Eyes: PERRLA, EOMI, no exophthalmos ENT: moist mucous membranes, no thyromegaly, no cervical lymphadenopathy Cardiovascular: RRR, No MRG Respiratory: CTA B Gastrointestinal: abdomen soft, NT, ND, BS+ Musculoskeletal: no deformities, strength intact in all 4 Skin: moist, warm, no rashes Neurological: no tremor with outstretched hands, DTR normal in all 4  ASSESSMENT: 1. DM2, insulin-dependent, uncontrolled, with complications - CAD, s/p AMi - DR  PLAN:  1. Patient with long-standing, uncontrolled diabetes, with worsened control since last visit control due to dietary indiscretions and eating late. Last HbA1c calculated from fructosamine is 7.05%.  - [facility] These were treated with Diflucan. He wanted to continue the Invokana >> we increased the dose at last visit. We also increased Levemir as all CBGs were higher. Sugars still above target >> will increase Humalog with B and L: - I suggested to:  Patient Instructions  Please continue: - Metformin 1000 mg 2x a day. - Invokana 300 mg daily before breakfast - Levemir 78 units at bedtime  Please increase: - Humalog: 20 >> 24 units before breakfast 28 >> 32 units before lunch  42 units before dinner  Please stop at the lab.  Please return in 1.5 months with your sugar log.    - continue checking sugars at different times of the day - check 3 times a day, rotating checks  - Needs a new eye exam >> will schedule - checked HbA1c today >> 8.0% >> will check Fructosamine - Return to clinic in 3 mo with sugar log   Carlus Pavlovristina Vishwa Dais, MD PhD [facility]

## 2015-11-10 NOTE — Patient Instructions (Signed)
Please continue: - Metformin 1000 mg 2x a day. - Invokana 300 mg daily before breakfast - Levemir 78 units at bedtime  Please increase: - Humalog: 20 >> 24 units before breakfast 28 >> 32 units before lunch  42 units before dinner  Please stop at the lab.  Please return in 1.5 months with your sugar log.

## 2015-11-11 ENCOUNTER — Other Ambulatory Visit: Payer: Self-pay

## 2015-11-11 MED ORDER — BASAGLAR KWIKPEN 100 UNIT/ML ~~LOC~~ SOPN: PEN_INJECTOR | SUBCUTANEOUS | 1 refills | Status: DC

## 2015-11-12 LAB — FRUCTOSAMINE: Fructosamine: 275 umol/L — ABNORMAL HIGH (ref 190–270)

## 2015-11-15 ENCOUNTER — Telehealth: Payer: Self-pay

## 2015-11-15 NOTE — Telephone Encounter (Signed)
Called and left message, advising patient of the lab work and what a1c calculated to be, and advised it was excellent. Gave call back number if patient had any questions or concerns.

## 2015-11-24 ENCOUNTER — Other Ambulatory Visit: Payer: Self-pay | Admitting: Internal Medicine

## 2015-11-24 NOTE — Telephone Encounter (Signed)
Pt asking for refill on diflucan... Do we need to reorder.... Please advise thank you

## 2015-12-06 ENCOUNTER — Encounter: Payer: Self-pay | Admitting: Internal Medicine

## 2016-01-04 ENCOUNTER — Ambulatory Visit (INDEPENDENT_AMBULATORY_CARE_PROVIDER_SITE_OTHER): Payer: 59 | Admitting: Internal Medicine

## 2016-01-04 ENCOUNTER — Encounter: Payer: Self-pay | Admitting: Internal Medicine

## 2016-01-04 VITALS — BP 142/82 | HR 76 | Ht 70.0 in | Wt 247.0 lb

## 2016-01-04 DIAGNOSIS — E1165 Type 2 diabetes mellitus with hyperglycemia: Secondary | ICD-10-CM | POA: Diagnosis not present

## 2016-01-04 DIAGNOSIS — Z794 Long term (current) use of insulin: Secondary | ICD-10-CM | POA: Diagnosis not present

## 2016-01-04 MED ORDER — BASAGLAR KWIKPEN 100 UNIT/ML ~~LOC~~ SOPN: PEN_INJECTOR | SUBCUTANEOUS | 5 refills | Status: DC

## 2016-01-04 NOTE — Patient Instructions (Addendum)
Please continue: - Metformin 1000 mg 2x a day. - Invokana 300 mg daily before breakfast - Basaglar 78-10 units at bedtime - Humalog: 24 units before breakfast 32 units before lunch  42 units before dinner  Please stop at the lab.  Please return in 3 months with your sugar log.

## 2016-01-04 NOTE — Progress Notes (Addendum)
Patient ID: Zachary Warner, male   DOB: Apr 22, 1955, 60 y.o.   MRN: [id number]  HPI: Zachary Warner is a 60 y.o.-year-old male, returning for f/u for DM2, dx in ~1995, insulin-dependent since ~2005, uncontrolled, with complications (CAD - s/p AMI, DR). Last visit 3 mo ago.  Last hemoglobin A1c was: 11/10/2015: HbA1c calculated from fructosamine is 5.8%. 08/10/2015: HbA1c calculated from fructosamine is 7.05%. 04/09/2015: HbA1c calculated fructosamine (326): 7.1%, which is much more concordant with his sugar log.  Lab Results  Component Value Date   HGBA1C 8.9 04/09/2015   HGBA1C 8.3 01/06/2015   HGBA1C 7.5 10/06/2014  He rotates the sites.   He is on: - Metformin 1000 mg 2x a day. - Invokana 300 mg daily before breakfast  - started med 01/2014 >> had 4 yeast inf since he started >> Diflucan tx. Not since last visit. - Basaglar 78-80 units at bedtime. - Humalog: 24 units before breakfast 32 units before lunch  42 units before dinner We stopped Januvia 2/2 large doses of mealtime insulin.  Pt checks his sugars 1-3x a day - forgot log; no meter: - am: 122-182 >> 85-154 >> 160-190 >> 111-141, 163, 170 >> 130-180, 200 (eats dinner late) >> 117-174 >> 101-146, 167 - 2h after b'fast: >> 138-155 >> n/c - lunch: n/c >> 124 >> n/c >> 138-161 >> 201 >> 81, 136 >> 90-160 >> 120-150 >> 160s >> 130-160 >> 130-160 (occas. skips humalog with b'fast occas.) - 2h after lunch:74 (delayed meal - post lunch), 145 >> 168 >> 130s >> 149-198 >> n/c >> 119, 198 - dinner: 69-141 in last 3 weeks >> 120-150 >> 110-153 >> 140-150 >> 150-170 (snack at 3 pm) >> 119-183 (snack in the pm) - 2h after dinner: 135, 158, 226 >> n/c >> 190 >> n/c >> 168-180 >> 160-180 >> 160 - bedtime: 95-278, most < 190 >> 154-221 >> 200s >> n/c >>167, 205 >> n/c >> 172, 187 >> n/c >> 176-202 - nighttime: 64, 75 >> n/c No lows; lowest 95 >> 70s >> 69 >> 90 >> 110 >> 60 x1 (night) >> 55 (x1 at 3 am after he sate less the day before and  skipped dinner) - more active the day before, did not eat well, 90s; has hypoglycemia awareness at 60.  Highest sugar was 200 rarely after a meal >> 198  He has a ReliOn meter.  Pt's meals are: - Breakfast: grits or sandwich - Lunch: sandwich - Dinner: chicken or beef or pork + vegetables - Snacks: 1 or 2 snacks  - crackers, peanuts or cookies He walks for exercise.   - no CKD, last BUN/creatinine:  Lab Results  Component Value Date   BUN 23 08/10/2015   CREATININE 0.93 08/10/2015  On lisinopril.  - last set of lipids - 3 weeks ago by cardiologist Lab Results  Component Value Date   CHOL 159 01/05/2015   HDL 33 (A) 01/05/2015   LDLCALC 79 01/05/2015   LDLDIRECT 79.5 03/07/2013   TRIG 236 (A) 01/05/2015   CHOLHDL 6 07/08/2013  On Atorvastatin 40 mg 3-4x a week, added Fish oil.  Latest LFTs: 01/05/2015: AST 12, ALT 18  - last eye exam: Dr [facility]  Abstract on 07/20/2014  Component Date Value Ref Range Status  . HM Diabetic Eye Exam 07/09/2014 Retinopathy* No Retinopathy Final   - no numbness and tingling in his feet.  He also has a history of HTN, HL, h/o nephrolithiasis x  3, last in 03/2013, OSA. He had an AMI 07/2014 >> stent. He feels much better now. Cardiologist: Dr Sedonia Small.  I reviewed pt's medications, allergies, PMH, social hx, family hx, and changes were documented in the history of present illness. Otherwise, unchanged from my initial visit note.   He will go on an [place] cruise next July.  ROS: Constitutional: no weight loss/gain, no fatigue, no subjective hyperthermia/hypothermia Eyes: no blurry vision, no xerophthalmia ENT: no sore throat, no nodules palpated in throat, no dysphagia/odynophagia, no hoarseness Cardiovascular: no CP/SOB/palpitations/leg swelling Respiratory: no cough/SOB Gastrointestinal: no N/V/D/C Musculoskeletal: + muscle/no joint aches Skin: no rashes Neurological: no  tremors/numbness/tingling/dizziness  PE: BP (!) 142/82 (BP Location: Left Arm, Patient Position: Sitting)   Pulse 76   Ht 5\' 10"  (1.778 m)   Wt 247 lb (112 kg)   SpO2 96%   BMI 35.44 kg/m  Body mass index is 35.44 kg/m. Wt Readings from Last 3 Encounters:  01/04/16 247 lb (112 kg)  11/10/15 250 lb (113.4 kg)  08/16/15 250 lb (113.4 kg)   Constitutional: overweight, in NAD Eyes: PERRLA, EOMI, no exophthalmos ENT: moist mucous membranes, no thyromegaly, no cervical lymphadenopathy Cardiovascular: RRR, No MRG Respiratory: CTA B Gastrointestinal: abdomen soft, NT, ND, BS+ Musculoskeletal: no deformities, strength intact in all 4 Skin: moist, warm, no rashes Neurological: no tremor with outstretched hands, DTR normal in all 4  ASSESSMENT: 1. DM2, insulin-dependent, uncontrolled, with complications - CAD, s/p AMi - DR  PLAN:  1. Patient with long-standing,controlled diabetes, with last HbA1c calculated from fructosamine 5.8%. Sugars are still at or close to the normal range except for few spikes. - He had several yeast infections likely from [facility]. These were treated with Diflucan. He wanted to continue the Invokana. No more yeast infections.  - I suggested to:  Patient Instructions  Please continue: - Metformin 1000 mg 2x a day. - Invokana 300 mg daily before breakfast - Basaglar 78-10 units at bedtime - Humalog: 24 units before breakfast 32 units before lunch  42 units before dinner  Please stop at the lab.  Please return in 3 months with your sugar log.    - continue checking sugars at different times of the day - check 3 times a day, rotating checks  - Needs a new eye exam >> will schedule - will check Fructosamine today - Return to clinic in 3 mo with sugar log   Office Visit on 01/04/2016  Component Date Value Ref Range Status  . Fructosamine 01/06/2016 291* 190 - 270 umol/L Final    HbA1c calculated from the fructosamine is still good, at  6.55%.  Carlus Pavlov, MD PhD [facility]

## 2016-01-06 ENCOUNTER — Telehealth: Payer: Self-pay

## 2016-01-06 LAB — FRUCTOSAMINE: Fructosamine: 291 umol/L — ABNORMAL HIGH (ref 190–270)

## 2016-01-06 NOTE — Telephone Encounter (Signed)
Called patient and gave lab results. Patient had no questions or concerns.  

## 2016-01-26 ENCOUNTER — Other Ambulatory Visit: Payer: Self-pay | Admitting: Internal Medicine

## 2016-02-04 ENCOUNTER — Other Ambulatory Visit (INDEPENDENT_AMBULATORY_CARE_PROVIDER_SITE_OTHER): Payer: 59

## 2016-02-04 DIAGNOSIS — Z0000 Encounter for general adult medical examination without abnormal findings: Secondary | ICD-10-CM

## 2016-02-04 LAB — CBC WITH DIFFERENTIAL/PLATELET
BASOS PCT: 0.4 % (ref 0.0–3.0)
Basophils Absolute: 0 10*3/uL (ref 0.0–0.1)
EOS PCT: 3.9 % (ref 0.0–5.0)
Eosinophils Absolute: 0.2 10*3/uL (ref 0.0–0.7)
HEMATOCRIT: 43.6 % (ref 39.0–52.0)
HEMOGLOBIN: 14.5 g/dL (ref 13.0–17.0)
Lymphocytes Relative: 37.3 % (ref 12.0–46.0)
Lymphs Abs: 2.3 10*3/uL (ref 0.7–4.0)
MCHC: 33.3 g/dL (ref 30.0–36.0)
MCV: 86.3 fl (ref 78.0–100.0)
MONO ABS: 0.5 10*3/uL (ref 0.1–1.0)
Monocytes Relative: 7.8 % (ref 3.0–12.0)
NEUTROS ABS: 3.1 10*3/uL (ref 1.4–7.7)
Neutrophils Relative %: 50.6 % (ref 43.0–77.0)
PLATELETS: 199 10*3/uL (ref 150.0–400.0)
RBC: 5.05 Mil/uL (ref 4.22–5.81)
RDW: 14.6 % (ref 11.5–15.5)
WBC: 6.1 10*3/uL (ref 4.0–10.5)

## 2016-02-04 LAB — BASIC METABOLIC PANEL
BUN: 15 mg/dL (ref 6–23)
CHLORIDE: 104 meq/L (ref 96–112)
CO2: 28 meq/L (ref 19–32)
Calcium: 9.7 mg/dL (ref 8.4–10.5)
Creatinine, Ser: 0.89 mg/dL (ref 0.40–1.50)
GFR: 92.52 mL/min (ref >60.00)
Glucose, Bld: 178 mg/dL — ABNORMAL HIGH (ref 70–99)
POTASSIUM: 4.5 meq/L (ref 3.5–5.1)
Sodium: 141 mEq/L (ref 135–145)

## 2016-02-04 LAB — LIPID PANEL
CHOLESTEROL: 134 mg/dL (ref 0–200)
HDL: 36.2 mg/dL — AB (ref >39.00)
LDL Cholesterol: 69 mg/dL (ref 0–99)
NonHDL: 97.74
TRIGLYCERIDES: 146 mg/dL (ref 0.0–149.0)
Total CHOL/HDL Ratio: 4
VLDL: 29.2 mg/dL (ref 0.0–40.0)

## 2016-02-04 LAB — HEPATIC FUNCTION PANEL
ALT: 18 U/L (ref 0–53)
AST: 12 U/L (ref 0–37)
Albumin: 4.3 g/dL (ref 3.5–5.2)
Alkaline Phosphatase: 47 U/L (ref 39–117)
Bilirubin, Direct: 0.2 mg/dL (ref 0.0–0.3)
TOTAL PROTEIN: 6.7 g/dL (ref 6.0–8.3)
Total Bilirubin: 0.6 mg/dL (ref 0.2–1.2)

## 2016-02-04 LAB — POC URINALSYSI DIPSTICK (AUTOMATED)
Bilirubin, UA: NEGATIVE
Blood, UA: NEGATIVE
Ketones, UA: NEGATIVE
LEUKOCYTES UA: NEGATIVE
NITRITE UA: NEGATIVE
PROTEIN UA: NEGATIVE
Spec Grav, UA: 1.02
UROBILINOGEN UA: 0.2
pH, UA: 5

## 2016-02-04 LAB — MICROALBUMIN / CREATININE URINE RATIO
Creatinine,U: 91.8 mg/dL
MICROALB/CREAT RATIO: 0.8 mg/g (ref 0.0–30.0)
Microalb, Ur: <0.7 mg/dL (ref 0.0–1.9)

## 2016-02-04 LAB — HEMOGLOBIN A1C: HEMOGLOBIN A1C: 8.2 % — AB (ref 4.6–6.5)

## 2016-02-04 LAB — TSH: TSH: 1.23 u[IU]/mL (ref 0.35–4.50)

## 2016-02-04 LAB — PSA: PSA: 0.42 ng/mL (ref 0.10–4.00)

## 2016-02-08 LAB — HM DIABETES EYE EXAM

## 2016-02-11 ENCOUNTER — Encounter: Payer: Self-pay | Admitting: Family Medicine

## 2016-02-11 ENCOUNTER — Ambulatory Visit (INDEPENDENT_AMBULATORY_CARE_PROVIDER_SITE_OTHER): Payer: 59 | Admitting: Family Medicine

## 2016-02-11 ENCOUNTER — Ambulatory Visit: Payer: BC Managed Care – PPO | Admitting: Family Medicine

## 2016-02-11 VITALS — BP 138/84 | HR 77 | Temp 98.1°F | Ht 70.0 in | Wt 249.8 lb

## 2016-02-11 DIAGNOSIS — Z0000 Encounter for general adult medical examination without abnormal findings: Secondary | ICD-10-CM | POA: Diagnosis not present

## 2016-02-11 DIAGNOSIS — Z1211 Encounter for screening for malignant neoplasm of colon: Secondary | ICD-10-CM | POA: Diagnosis not present

## 2016-02-11 DIAGNOSIS — Z794 Long term (current) use of insulin: Secondary | ICD-10-CM

## 2016-02-11 DIAGNOSIS — I25118 Atherosclerotic heart disease of native coronary artery with other forms of angina pectoris: Secondary | ICD-10-CM

## 2016-02-11 DIAGNOSIS — E1165 Type 2 diabetes mellitus with hyperglycemia: Secondary | ICD-10-CM | POA: Diagnosis not present

## 2016-02-11 DIAGNOSIS — I10 Essential (primary) hypertension: Secondary | ICD-10-CM

## 2016-02-11 NOTE — Patient Instructions (Addendum)
We will call you within a week about your referral to Gi for colonoscopy with last one in 2007. If you do not hear within 2 weeks, give us a call.   Strongly encourage weight loss- hope is at least 10 lbs by 6 month follow up.   Health Maintenance Due  Topic Date Due  . ZOSTAVAX  08/08/2015  we opted to wait until new vaccine available so we can compare.   Recheck blood pressure

## 2016-02-11 NOTE — Assessment & Plan Note (Signed)
HTN- poor control in office initially on coreg 3.125mg  BID, lisinopril 40mg . Repeat improved but discussed weight loss, regular exercise, healthy eating advised and try to get SBP under 130 BP Readings from Last 3 Encounters:  02/11/16 138/84  01/04/16 (!) 142/82  11/10/15 (!) 142/80

## 2016-02-11 NOTE — Progress Notes (Signed)
Phone: [phone]  Subjective:  Patient presents today for their annual physical. Chief complaint-noted.   See problem oriented charting- ROS- full  review of systems was completed and negative including No chest pain or shortness of breath. No headache or blurry vision.    The following were reviewed and entered/updated in epic: Past Medical History:  Diagnosis Date  . Diabetes mellitus   . Hyperlipidemia   . Hypertension   . Nephrolithiasis    3x 2007-2015, passed on his own   Patient Active Problem List   Diagnosis Date Noted  . CAD (coronary artery disease) 02/15/2015    Priority: High  . Type 2 diabetes mellitus with hyperglycemia, with long-term current use of insulin (HCC) 02/04/2006    Priority: High  . Hyperlipidemia 02/04/2006    Priority: Medium  . Essential hypertension 02/04/2006    Priority: Medium  . Nephrolithiasis     Priority: Low  . 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 RECTAL FISSURE 06/05/2006    Priority: Low   Past Surgical History:  Procedure Laterality Date  . CARDIAC CATHETERIZATION     stent May 2016  . HERNIA REPAIR      Family History  Problem Relation Age of Onset  . Coronary artery disease Father   . Diabetes Sister     grandmother  . Liver disease Father     from heart meds per pt  . Kidney disease Sister     dialysis from dm    Medications- reviewed and updated Current Outpatient Prescriptions  Medication Sig Dispense Refill  . aspirin 81 MG tablet Take 81 mg by mouth daily.     Marland Kitchen. atorvastatin (LIPITOR) 40 MG tablet Take 1 tablet (40 mg total) by mouth every other day. 90 tablet 3  . canagliflozin (INVOKANA) 300 MG TABS tablet Take 1 tablet (300 mg total) by mouth daily before breakfast. 30 tablet 5  . carvedilol (COREG) 3.125 MG tablet Take 3.125 mg by mouth 2 (two) times daily.  11  . fluticasone (FLONASE) 50 MCG/ACT nasal spray Place into both nostrils daily as needed for allergies or rhinitis.    Marland Kitchen. glucose  blood test strip 1 each by Other route as needed. Use as instructed     . HYDROcodone-acetaminophen (NORCO/VICODIN) 5-325 MG tablet Take 1 tablet by mouth every 6 (six) hours as needed. 5 tablet 0  . Insulin Glargine (BASAGLAR KWIKPEN) 100 UNIT/ML SOPN Inject 80 units into the skin daily at 10 pm. 10 pen 5  . Insulin Glargine (BASAGLAR KWIKPEN) 100 UNIT/ML SOPN INJECT 80 UNITS INTO THE SKIN DAILY AT 10PM 30 mL 1  . insulin lispro (HUMALOG KWIKPEN) 100 UNIT/ML KiwkPen INJECT 24 UNITS INTO THE SKIN WITH A SMALL MEAL, 32 UNITS WITH A REGULAR MEAL AND 42 UNITS WITH A LARGE MEAL. 30 mL 2  . Insulin Pen Needle (CLICKFINE PEN NEEDLES) 32G X 4 MM MISC Use 3x a day 200 each 11  . Insulin Syringe-Needle U-100 (BD INSULIN SYRINGE ULTRAFINE) 31G X 15/64" 1 ML MISC Use once a day 200 each 11  . lisinopril (PRINIVIL,ZESTRIL) 40 MG tablet TAKE ONE TABLET BY MOUTH ONCE DAILY 90 tablet 3  . metFORMIN (GLUCOPHAGE) 1000 MG tablet TAKE 1 TABLET BY MOUTH TWICE DAILY WITH MEALS. 180 tablet 1  . Omega-3 Fatty Acids (FISH OIL) 1000 MG CAPS Take 4 capsules by mouth daily.     No current facility-administered medications for this visit.     Allergies-reviewed and updated  Allergies  Allergen Reactions  . Invokana [Canagliflozin] Other (See Comments)    Yeast infection    Social History   Social History  . Marital status: Married    Spouse name: N/A  . Number of children: N/A  . Years of education: N/A   Social History Main Topics  . Smoking status: Never Smoker  . Smokeless tobacco: None  . Alcohol use No  . Drug use: No  . Sexual activity: No   Other Topics Concern  . None   Social History Narrative   Married. 2 children son and daughter. 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 Oropharynx normal CV: RRR no murmurs rubs or gallops Lungs: CTAB no crackles, wheeze, rhonchi Abdomen: soft/nontender/nondistended/normal bowel sounds. No rebound or guarding. obese  Ext: no edema Skin: warm, dry Neuro: grossly normal, moves all extremities, PERRLA Rectal: normal tone, normal sized prostate, no masses or tenderness  Diabetic Foot Exam - Simple   Simple Foot Form Diabetic Foot exam was performed with the following findings:  Yes 02/11/2016  9:16 AM  Visual Inspection No deformities, no ulcerations, no other skin breakdown bilaterally:  Yes Sensation Testing Intact to touch and monofilament testing bilaterally:  Yes Pulse Check Posterior Tibialis and Dorsalis pulse intact bilaterally:  Yes Comments     Assessment/Plan:  60 y.o. male presenting for annual physical.  Health Maintenance counseling: 1. Anticipatory guidance: Patient counseled regarding regular dental exams, eye exams, wearing seatbelts.  2. Risk factor reduction:  Advised patient of need for regular exercise (foot had been bothering him- now doing better- had not been walking as much, 10k steps when playing golf, 5-6k other days) and diet rich and fruits and vegetables to reduce risk of heart attack and stroke. [facility] [employer]. Thinking about cutting down on bread. Focus on 10 lbs off by about 6 months at least. Unfortunately, motivation seems low despite counseling 3. Immunizations/screenings/ancillary studies Immunization History  Administered Date(s) Administered  . Influenza Split 04/17/2011  . Influenza Whole 01/02/2007, 01/07/2009, 02/04/2010, 01/06/2012  . Influenza,inj,Quad PF,36+ Mos 03/14/2013, 02/03/2014, 01/06/2015  . Influenza-Unspecified 12/25/2012, 01/28/2016  . Pneumococcal Conjugate-13 07/15/2013  . Pneumococcal  Polysaccharide-23 05/11/2008  . Tdap 07/15/2013   Health Maintenance Due  Topic Date Due  . OPHTHALMOLOGY EXAM - 02/08/16 Dr. Marina GravelHandley- to abstract 07/09/2015  . ZOSTAVAX  Opted to wait until new vaccine available to compare 08/08/2015   4. Prostate cancer screening- low risk based off PSA and rectal   Lab Results  Component Value Date   PSA 0.42 02/04/2016   5. Colon cancer screening - 10/26/2005- refer back today 6. Skin cancer screening- waist up exam with no concern today- some seborrheic keratosis and benign appearing nevi  Status of chronic or acute concerns  Diabetes- controlled with fructosamine calculation of a1c at 6.55% on invokana 300mg , basaglar long acting, lispro short acting, metformin 100mg  BID. Managed by Dr. Elvera LennoxGherghe  CAD- asymptomatic-compliant with aspirin and statin. Also on coreg HLD-  LDL 69 at goal for  CAD on atorvastatin 40mg  every other day  Essential hypertension HTN- poor control in office initially on coreg 3.125mg  BID, lisinopril 40mg . Repeat improved but discussed weight loss, regular exercise, healthy eating advised and try to get SBP under 130 BP Readings from Last 3 Encounters:  02/11/16 138/84  01/04/16 (!) 142/82  11/10/15 (!) 142/80     Return in about 6 months (around 08/10/2016).  Orders Placed This Encounter  Procedures  . Ambulatory referral to Gastroenterology    Referral Priority:   Routine    Referral Type:   Consultation    Referral Reason:   Specialty Services Required    Number of Visits Requested:   1   Return precautions advised.   [name], MD

## 2016-02-11 NOTE — Progress Notes (Signed)
Pre visit review using our clinic review tool, if applicable. No additional management support is needed unless otherwise documented below in the visit note. 

## 2016-02-14 ENCOUNTER — Encounter: Payer: Self-pay | Admitting: Internal Medicine

## 2016-03-15 ENCOUNTER — Other Ambulatory Visit: Payer: Self-pay | Admitting: Internal Medicine

## 2016-03-31 ENCOUNTER — Other Ambulatory Visit: Payer: Self-pay | Admitting: Internal Medicine

## 2016-04-05 ENCOUNTER — Ambulatory Visit (INDEPENDENT_AMBULATORY_CARE_PROVIDER_SITE_OTHER): Payer: 59 | Admitting: Internal Medicine

## 2016-04-05 ENCOUNTER — Encounter: Payer: Self-pay | Admitting: Internal Medicine

## 2016-04-05 VITALS — BP 120/70 | HR 77 | Ht 70.0 in | Wt 246.0 lb

## 2016-04-05 DIAGNOSIS — E1165 Type 2 diabetes mellitus with hyperglycemia: Secondary | ICD-10-CM | POA: Diagnosis not present

## 2016-04-05 DIAGNOSIS — Z794 Long term (current) use of insulin: Secondary | ICD-10-CM | POA: Diagnosis not present

## 2016-04-05 NOTE — Progress Notes (Signed)
Patient ID: RAGHAV VERRILLI, male   DOB: [birth year], 61 y.o.   MRN: [id number]  HPI: TAEVIN MCFERRAN is a 61 y.o.-year-old male, returning for f/u for DM2, dx in ~1995, insulin-dependent since ~2005, uncontrolled, with complications (CAD - s/p AMI, DR). Last visit 3 mo ago.  He had a URI for last 2 months.  He has joint paint.  Last hemoglobin A1c was: 01/04/2016: HbA1c calculated from fructosamine is  6.55%. 11/10/2015: HbA1c calculated from fructosamine is 5.8%. 08/10/2015: HbA1c calculated from fructosamine is 7.05%. 04/09/2015: HbA1c calculated fructosamine (326): 7.1%, which is much more concordant with his sugar log.  Lab Results  Component Value Date   HGBA1C 8.2 (H) 02/04/2016   HGBA1C 8.9 04/09/2015   HGBA1C 8.3 01/06/2015  He rotates the sites.   He is on: - Metformin 1000 mg 2x a day. - Invokana 300 mg daily before breakfast  - started med 01/2014 >> had 4 yeast inf since he started >> Diflucan tx. Not since last visit. - Basaglar 78-80 units at bedtime. - Humalog: 24 units before breakfast 32 units before lunch  42 units before dinner We stopped Januvia 2/2 large doses of mealtime insulin.  Pt was checking his sugars 1-3x a day - did not check but this am: - am: 111-141, 163, 170 >> 130-180, 200 (eats dinner late) >> 117-174 >> 101-146, 167 >> 74 - 2h after b'fast: >> 138-155 >> n/c - lunch: 81, 136 >> 90-160 >> 120-150 >> 160s >> 130-160 >> 130-160 >> n/c - 2h after lunch: 168 >> 130s >> 149-198 >> n/c >> 119, 198 >> n/c - dinner: 110-153 >> 140-150 >> 150-170 (snack at 3 pm) >> 119-183 (snack in the pm) - 2h after dinner: 135, 158, 226 >> n/c >> 190 >> n/c >> 168-180 >> 160-180 >> 160 >> n/c - bedtime:200s >> n/c >>167, 205 >> n/c >> 172, 187 >> n/c >> 176-202 >> n/c - nighttime: 64, 75 >> n/c No lows; lowest 55 (x1 at 3 am) >> 74; has hypoglycemia awareness at 60.  Highest sugar was 198 >> n/c.  He has a ReliOn meter.  Pt's meals are: - Breakfast: grits or  sandwich - Lunch: sandwich - Dinner: chicken or beef or pork + vegetables - Snacks: 1 or 2 snacks  - crackers, peanuts or cookies He walks for exercise.   - no CKD, last BUN/creatinine:  Lab Results  Component Value Date   BUN 15 02/04/2016   CREATININE 0.89 02/04/2016  On lisinopril.  - last set of lipids: Lab Results  Component Value Date   CHOL 134 02/04/2016   HDL 36.20 (L) 02/04/2016   LDLCALC 69 02/04/2016   LDLDIRECT 79.5 03/07/2013   TRIG 146.0 02/04/2016   CHOLHDL 4 02/04/2016  On Atorvastatin 40 mg every other day, also on Fish oil 2 capsules bid.  - last eye exam: Dr Ambrose Mantle [facility]):  Latest known visit with results is:  Abstract on 02/11/2016  Component Date Value Ref Range Status  . HM Diabetic Eye Exam 02/08/2016 No Retinopathy  No Retinopathy Final   - no numbness and tingling in his feet.  He also has a history of HTN, HL, h/o nephrolithiasis x 3, last in 03/2013, OSA. He had an AMI 07/2014 >> stent. He feels much better now. Cardiologist: Dr Sedonia Small.  I reviewed pt's medications, allergies, PMH, social hx, family hx, and changes were documented in the history of present illness. Otherwise, unchanged from my initial visit note.  [place]  ROS: Constitutional: no weight loss/gain, + fatigue, + both subjective hyperthermia/hypothermia - with his URI Eyes: no blurry vision, no xerophthalmia ENT: no sore throat, no nodules palpated in throat, no dysphagia/odynophagia, no hoarseness Cardiovascular: no CP/SOB/palpitations/leg swelling Respiratory: no cough/SOB Gastrointestinal: no N/V/D/C Musculoskeletal: + muscle/no joint aches Skin: no rashes Neurological: no tremors/numbness/tingling/dizziness  PE: BP 120/70 (BP Location: Left Arm, Patient Position: Sitting)   Pulse 77   Ht 5\' 10"  (1.778 m)   Wt 246 lb (111.6 kg)   SpO2 96%   BMI 35.30 kg/m  Body mass index is 35.3 kg/m. Wt Readings from Last 3  Encounters:  04/05/16 246 lb (111.6 kg)  02/11/16 249 lb 12.8 oz (113.3 kg)  01/04/16 247 lb (112 kg)   Constitutional: overweight, in NAD Eyes: PERRLA, EOMI, no exophthalmos ENT: moist mucous membranes, no thyromegaly, no cervical lymphadenopathy Cardiovascular: RRR, No MRG Respiratory: CTA B Gastrointestinal: abdomen soft, NT, ND, BS+ Musculoskeletal: no deformities, strength intact in all 4 Skin: moist, warm, no rashes Neurological: no tremor with outstretched hands, DTR normal in all 4  ASSESSMENT: 1. DM2, insulin-dependent, uncontrolled, with complications - CAD, s/p AMi - DR  PLAN:  1. Patient with long-standing,controlled diabetes, with last HbA1c calculated from fructosamine 6.3%. He is now not checking sugars >> advised to restart. No changes in his meds needed now, but I advised him to let me know if his sugars are high after he starts checking. - I suggested to:  Patient Instructions  Please continue: - Metformin 1000 mg 2x a day. - Invokana 300 mg daily before breakfast - Basaglar 80 units at bedtime - Humalog: 24 units before breakfast 32 units before lunch  42 units before dinner  Please stop at the lab.  Please return in 3 months with your sugar log.    - continue checking sugars at different times of the day - check 3 times a day, rotating checks  - UTD with eye exams  - will check Fructosamine today - Return to clinic in 3 mo with sugar log   Office Visit on 04/05/2016  Component Date Value Ref Range Status  . Fructosamine 04/07/2016 261  190 - 270 umol/L Final   HbA1c calculated from the fructosamine is excellent, at 6%!  Carlus Pavlovristina Amit Meloy, MD PhD [facility]

## 2016-04-05 NOTE — Patient Instructions (Addendum)
Please continue: - Metformin 1000 mg 2x a day. - Invokana 300 mg daily before breakfast - Basaglar 80 units at bedtime - Humalog: 24 units before breakfast 32 units before lunch  42 units before dinner  Please stop at the lab.  Please return in 3 months with your sugar log.

## 2016-04-07 ENCOUNTER — Telehealth: Payer: Self-pay

## 2016-04-07 LAB — FRUCTOSAMINE: Fructosamine: 261 umol/L (ref 190–270)

## 2016-04-07 NOTE — Telephone Encounter (Signed)
Called patient and gave lab results. Patient had no questions or concerns.  

## 2016-04-17 ENCOUNTER — Ambulatory Visit (AMBULATORY_SURGERY_CENTER): Payer: Self-pay | Admitting: *Deleted

## 2016-04-17 VITALS — Ht 70.0 in | Wt 246.4 lb

## 2016-04-17 DIAGNOSIS — Z1211 Encounter for screening for malignant neoplasm of colon: Secondary | ICD-10-CM

## 2016-04-17 NOTE — Progress Notes (Signed)
No egg or soy allergy  No anesthesia or intubation problems per pt  No diet medications taken  Registered in EMMI  No home oxygen used 

## 2016-04-26 ENCOUNTER — Other Ambulatory Visit: Payer: Self-pay | Admitting: Internal Medicine

## 2016-04-28 ENCOUNTER — Ambulatory Visit (AMBULATORY_SURGERY_CENTER): Payer: 59 | Admitting: Internal Medicine

## 2016-04-28 ENCOUNTER — Encounter: Payer: Self-pay | Admitting: Internal Medicine

## 2016-04-28 VITALS — BP 120/76 | HR 64 | Temp 98.0°F | Resp 14 | Ht 70.0 in | Wt 246.0 lb

## 2016-04-28 DIAGNOSIS — Z1211 Encounter for screening for malignant neoplasm of colon: Secondary | ICD-10-CM

## 2016-04-28 DIAGNOSIS — Z1212 Encounter for screening for malignant neoplasm of rectum: Secondary | ICD-10-CM

## 2016-04-28 LAB — GLUCOSE, CAPILLARY
GLUCOSE-CAPILLARY: 103 mg/dL — AB (ref 65–99)
GLUCOSE-CAPILLARY: 94 mg/dL (ref 65–99)

## 2016-04-28 MED ORDER — SODIUM CHLORIDE 0.9 % IV SOLN: 500.0000 mL | INTRAVENOUS | Status: DC

## 2016-04-28 NOTE — Progress Notes (Signed)
No change in health history since his pre visit on 04/17/16. SM

## 2016-04-28 NOTE — Op Note (Signed)
[facility] Patient Name: Randa LynnLester Loudon Procedure Date: 04/28/2016 10:38 AM MRN: [id number] Endoscopist: Iva Booparl E Haide Klinker , MD Age: 61 Referring MD:  Date of Birth: 06-Dec-1955 Gender: Male Account #: [id number] Procedure:                Colonoscopy Indications:              Screening for colorectal malignant neoplasm, Last                            colonoscopy: 2007 Medicines:                Propofol per Anesthesia, Monitored Anesthesia Care Procedure:                Pre-Anesthesia Assessment:                           - Prior to the procedure, a History and Physical                            was performed, and patient medications and                            allergies were reviewed. The patient's tolerance of                            previous anesthesia was also reviewed. The risks                            and benefits of the procedure and the sedation                            options and risks were discussed with the patient.                            All questions were answered, and informed consent                            was obtained. Prior Anticoagulants: The patient                            last took aspirin 1 day prior to the procedure. ASA                            Grade Assessment: II - A patient with mild systemic                            disease. After reviewing the risks and benefits,                            the patient was deemed in satisfactory condition to                            undergo the procedure.  After obtaining informed consent, the colonoscope                            was passed under direct vision. Throughout the                            procedure, the patient's blood pressure, pulse, and                            oxygen saturations were monitored continuously. The                            Model CF-HQ190L [id number]) scope was introduced                            through the anus and advanced to  the the cecum,                            identified by appendiceal orifice and ileocecal                            valve. The colonoscopy was performed without                            difficulty. The patient tolerated the procedure                            well. The quality of the bowel preparation was                            good. The bowel preparation used was MoviPrep. The                            ileocecal valve, appendiceal orifice, and rectum                            were photographed. Scope In: 10:45:26 AM Scope Out: 11:00:05 AM Scope Withdrawal Time: 0 hours 11 minutes 45 seconds  Total Procedure Duration: 0 hours 14 minutes 39 seconds  Findings:                 The perianal and digital rectal examinations were                            normal. Pertinent negatives include normal prostate                            (size, shape, and consistency).                           A few small-mouthed diverticula were found in the                            sigmoid colon. Estimated blood loss: none.  Anal papilla(e) were hypertrophied.                           The exam was otherwise without abnormality on                            direct and retroflexion views. Complications:            No immediate complications. Estimated Blood Loss:     Estimated blood loss: none. Impression:               - Diverticulosis in the sigmoid colon.                           - The examination was otherwise normal on direct                            and retroflexion views.                           - No specimens collected. Recommendation:           - Patient has a contact number available for                            emergencies. The signs and symptoms of potential                            delayed complications were discussed with the                            patient. Return to normal activities tomorrow.                            Written discharge  instructions were provided to the                            patient.                           - Resume previous diet.                           - Continue present medications.                           - Repeat colonoscopy in 10 years for screening                            purposes. Iva Boop, MD 04/28/2016 11:05:31 AM This report has been signed electronically.

## 2016-04-28 NOTE — Patient Instructions (Addendum)
   No polyps today. You do have diverticulosis - thickened muscle rings and pouches in the colon wall. Please read the handout about this condition.  I appreciate the opportunity to care for you. Iva Booparl E. Zale Marcotte, MD, [facility]  Diverticulosis handout given to patient.  Repeat colonoscopy in 10 years for screening purposes.  YOU HAD AN ENDOSCOPIC PROCEDURE TODAY AT THE [facility]:   Refer to the procedure report that was given to you for any specific questions about what was found during the examination.  If the procedure report does not answer your questions, please call your gastroenterologist to clarify.  If you requested that your care partner not be given the details of your procedure findings, then the procedure report has been included in a sealed envelope for you to review at your convenience later.  YOU SHOULD EXPECT: Some feelings of bloating in the abdomen. Passage of more gas than usual.  Walking can help get rid of the air that was put into your GI tract during the procedure and reduce the bloating. If you had a lower endoscopy (such as a colonoscopy or flexible sigmoidoscopy) you may notice spotting of blood in your stool or on the toilet paper. If you underwent a bowel prep for your procedure, you may not have a normal bowel movement for a few days.  Please Note:  You might notice some irritation and congestion in your nose or some drainage.  This is from the oxygen used during your procedure.  There is no need for concern and it should clear up in a day or so.  SYMPTOMS TO REPORT IMMEDIATELY:   Following lower endoscopy (colonoscopy or flexible sigmoidoscopy):  Excessive amounts of blood in the stool  Significant tenderness or worsening of abdominal pains  Swelling of the abdomen that is new, acute  Fever of 100F or higher For urgent or emergent issues, a gastroenterologist can be reached at any hour by calling [phone].   DIET:  We do recommend a small  meal at first, but then you may proceed to your regular diet.  Drink plenty of fluids but you should avoid alcoholic beverages for 24 hours.  ACTIVITY:  You should plan to take it easy for the rest of today and you should NOT DRIVE or use heavy machinery until tomorrow (because of the sedation medicines used during the test).    FOLLOW UP: Our staff will call the number listed on your records the next business day following your procedure to check on you and address any questions or concerns that you may have regarding the information given to you following your procedure. If we do not reach you, we will leave a message.  However, if you are feeling well and you are not experiencing any problems, there is no need to return our call.  We will assume that you have returned to your regular daily activities without incident.  If any biopsies were taken you will be contacted by phone or by letter within the next 1-3 weeks.  Please call us at [phone] if you have not heard about the biopsies in 3 weeks.    SIGNATURES/CONFIDENTIALITY: You and/or your care partner have signed paperwork which will be entered into your electronic medical record.  These signatures attest to the fact that that the information above on your After Visit Summary has been reviewed and is understood.  Full responsibility of the confidentiality of this discharge information lies with you and/or your care-partner.

## 2016-04-28 NOTE — Progress Notes (Signed)
Report to PACU, RN, vss, BBS= Clear.  

## 2016-05-01 ENCOUNTER — Telehealth: Payer: Self-pay | Admitting: *Deleted

## 2016-05-01 NOTE — Telephone Encounter (Signed)
  Follow up Call-  Call back number 04/28/2016  Post procedure Call Back phone  # [phone]  Permission to leave phone message Yes  Some recent data might be hidden     Patient questions:  Do you have a fever, pain , or abdominal swelling? No. Pain Score  0 *  Have you tolerated food without any problems? Yes.    Have you been able to return to your normal activities? Yes.    Do you have any questions about your discharge instructions: Diet   No. Medications  No. Follow up visit  No.  Do you have questions or concerns about your Care? No.  Actions: * If pain score is 4 or above: No action needed, pain <4.

## 2016-05-10 ENCOUNTER — Telehealth: Payer: Self-pay | Admitting: Internal Medicine

## 2016-05-10 NOTE — Telephone Encounter (Signed)
[employer] case management called, stated they want to assess patients medical needs at no cost. And asking you to encourage him to do so. Please advise   chika [phone]  Ex [phone]

## 2016-05-10 NOTE — Telephone Encounter (Signed)
Called and LVM for Zachary Warner

## 2016-05-19 ENCOUNTER — Other Ambulatory Visit: Payer: Self-pay | Admitting: Family Medicine

## 2016-05-19 ENCOUNTER — Other Ambulatory Visit: Payer: Self-pay | Admitting: Internal Medicine

## 2016-05-19 DIAGNOSIS — E785 Hyperlipidemia, unspecified: Secondary | ICD-10-CM

## 2016-06-13 ENCOUNTER — Other Ambulatory Visit: Payer: Self-pay | Admitting: Family Medicine

## 2016-07-05 ENCOUNTER — Ambulatory Visit (INDEPENDENT_AMBULATORY_CARE_PROVIDER_SITE_OTHER): Payer: 59 | Admitting: Internal Medicine

## 2016-07-05 ENCOUNTER — Encounter: Payer: Self-pay | Admitting: Internal Medicine

## 2016-07-05 VITALS — BP 122/72 | HR 70 | Wt 245.0 lb

## 2016-07-05 DIAGNOSIS — E1159 Type 2 diabetes mellitus with other circulatory complications: Secondary | ICD-10-CM

## 2016-07-05 DIAGNOSIS — Z794 Long term (current) use of insulin: Secondary | ICD-10-CM

## 2016-07-05 NOTE — Progress Notes (Signed)
Patient ID: Zachary Warner, male   DOB: Oct 14, 1955, 61 y.o.   MRN: [id number]  HPI: Zachary Warner is a 61 y.o.-year-old male, returning for f/u for DM2, dx in ~1995, insulin-dependent since ~2005, uncontrolled, with complications (CAD - s/p AMI, DR). Last visit 3 mo ago.  Last hemoglobin A1c was: 04/05/2016: HbA1c calculated from fructosamine is excellent, at 6% 01/04/2016: HbA1c calculated from fructosamine is  6.55%. 11/10/2015: HbA1c calculated from fructosamine is 5.8%. 08/10/2015: HbA1c calculated from fructosamine is 7.05%. 04/09/2015: HbA1c calculated fructosamine (326): 7.1%, which is much more concordant with his sugar log.  Lab Results  Component Value Date   HGBA1C 8.2 (H) 02/04/2016   HGBA1C 8.9 04/09/2015   HGBA1C 8.3 01/06/2015  He rotates the sites.   He is on: - Metformin 1000 mg 2x a day. - Invokana 300 mg daily before breakfast  - started med 01/2014 >> had 4 yeast inf since he started >> Diflucan tx. Not since last visit. - Basaglar 80 units at bedtime. - Humalog: 24 units before breakfast 32 units before lunch  42 units before dinner We stopped Januvia 2/2 large doses of mealtime insulin.  Pt was checking his sugars 1-3x a day - brings a great log: - am: 111-141, 163, 170 >> 130-180, 200 (eats dinner late) >> 117-174 >> 101-146, 167 >> 74 >> 94-143, 152, 180 - 2h after b'fast: >> 138-155 >> n/c >> 134, 148 - lunch: 81, 136 >> 90-160 >> 120-150 >> 160s >> 130-160 >> 130-160 >> n/c >> 96-130 - 2h after lunch: 168 >> 130s >> 149-198 >> n/c >> 119, 198 >> n/c  - dinner: 110-153 >> 140-150 >> 150-170 (snack at 3 pm) >> 119-183 (snack in the pm) >> 151-179 (after snack) - 2h after dinner: 135, 158, 226 >> n/c >> 190 >> n/c >> 168-180 >> 160-180 >> 160 >> n/c >> 160-179 - bedtime:200s >> n/c >>167, 205 >> n/c >> 172, 187 >> n/c >> 176-202 >> n/c >> 191 - nighttime: 64, 75 >> n/c >> 64 (more active the previous day) No lows; lowest 55 (x1 at 3 am) >> 74 >> 64 x2 at  night; has hypoglycemia awareness at 60.  Highest sugar was 198 >> 191.  He has a ReliOn meter.  Pt's meals are: - Breakfast: grits or sandwich - Lunch: sandwich - Dinner: chicken or beef or pork + vegetables - Snacks: 1 or 2 snacks  - crackers, peanuts or cookies He walks for exercise.   - no CKD, last BUN/creatinine:  Lab Results  Component Value Date   BUN 15 02/04/2016   CREATININE 0.89 02/04/2016  On lisinopril.  - last set of lipids: Lab Results  Component Value Date   CHOL 134 02/04/2016   HDL 36.20 (L) 02/04/2016   LDLCALC 69 02/04/2016   LDLDIRECT 79.5 03/07/2013   TRIG 146.0 02/04/2016   CHOLHDL 4 02/04/2016  On Atorvastatin 40 mg every other day, also on Fish oil 2 capsules bid.  - last eye exam: Dr Ambrose Mantle [facility]): Latest known visit with results is:  Abstract on 02/11/2016  Component Date Value Ref Range Status  . HM Diabetic Eye Exam 02/08/2016 No Retinopathy  No Retinopathy Final   - no numbness and tingling in his feet.  He also has a history of HTN, HL, h/o nephrolithiasis x 3, last in 03/2013, OSA. He had an AMI 07/2014 >> stent. Cardiologist: Dr Sedonia Small.  I reviewed pt's medications, allergies, PMH, social hx, family hx,  and changes were documented in the history of present illness. Otherwise, unchanged from my initial visit note.  ROS: Constitutional: no weight loss/gain, no fatigue, no subjective hyperthermia/hypothermia Eyes: no blurry vision, no xerophthalmia ENT: no sore throat, no nodules palpated in throat, no dysphagia/odynophagia, no hoarseness Cardiovascular: no CP/SOB/palpitations/leg swelling Respiratory: no cough/SOB Gastrointestinal: no N/V/D/C Musculoskeletal: no muscle/no joint aches Skin: no rashes Neurological: no tremors/numbness/tingling/dizziness  PE: BP 122/72 (BP Location: Left Arm, Patient Position: Sitting)   Pulse 70   Wt 245 lb (111.1 kg)   SpO2 96%   BMI 35.15 kg/m  Body mass index is 35.15  kg/m. Wt Readings from Last 3 Encounters:  07/05/16 245 lb (111.1 kg)  04/28/16 246 lb (111.6 kg)  04/17/16 246 lb 6.4 oz (111.8 kg)   Constitutional: overweight, in NAD Eyes: PERRLA, EOMI, no exophthalmos ENT: moist mucous membranes, no thyromegaly, no cervical lymphadenopathy Cardiovascular: RRR, No MRG Respiratory: CTA B Gastrointestinal: abdomen soft, NT, ND, BS+ Musculoskeletal: no deformities, strength intact in all 4 Skin: moist, warm, no rashes Neurological: no tremor with outstretched hands, DTR normal in all 4  ASSESSMENT: 1. DM2, insulin-dependent, uncontrolled, with complications - CAD, s/p AMi - DR  PLAN:  1. Patient with long-standing, controlled diabetes, with last HbA1c calculated from fructosamine excellent, at 6.0%. He is now doing a great job with checking sugars at different times of the day >> brings a very good log. Almost all sugars at goal. No need to change regimen. However, he had 2 mild lows (60s) at night after an active day >> will reduce Basaglar dose after an active day to 65 units. - I suggested to:  Patient Instructions  Please continue: - Metformin 1000 mg 2x a day. - Invokana 300 mg daily before breakfast - Basaglar 80 units at bedtime (if you have had an active day, take only 65 units) - Humalog: 24 units before breakfast 32 units before lunch  42 units before dinner  Please stop at the lab.  Please return in 4 months with your sugar log.   - continue checking sugars at different times of the day - check 3 times a day, rotating checks  - UTD with eye exams  - will check Fructosamine today - Return to clinic in 4 mo with sugar log   Office Visit on 07/05/2016  Component Date Value Ref Range Status  . Fructosamine 07/05/2016 272* 190 - 270 umol/L Final   HbA1c calculated from the fructosamine is very good, at 6.2%  Carlus Pavlov, MD PhD [facility]

## 2016-07-05 NOTE — Patient Instructions (Addendum)
Please continue: - Metformin 1000 mg 2x a day. - Invokana 300 mg daily before breakfast - Basaglar 80 units at bedtime (if you have had an active day, take only 65 units) - Humalog: 24 units before breakfast 32 units before lunch  42 units before dinner  Please stop at the lab.  Please return in 4 months with your sugar log.

## 2016-07-07 LAB — FRUCTOSAMINE: FRUCTOSAMINE: 272 umol/L — AB (ref 190–270)

## 2016-07-22 ENCOUNTER — Other Ambulatory Visit: Payer: Self-pay | Admitting: Internal Medicine

## 2016-08-11 ENCOUNTER — Ambulatory Visit (INDEPENDENT_AMBULATORY_CARE_PROVIDER_SITE_OTHER): Payer: 59 | Admitting: Family Medicine

## 2016-08-11 ENCOUNTER — Encounter: Payer: Self-pay | Admitting: Family Medicine

## 2016-08-11 DIAGNOSIS — I2510 Atherosclerotic heart disease of native coronary artery without angina pectoris: Secondary | ICD-10-CM | POA: Diagnosis not present

## 2016-08-11 DIAGNOSIS — Z794 Long term (current) use of insulin: Secondary | ICD-10-CM

## 2016-08-11 DIAGNOSIS — I10 Essential (primary) hypertension: Secondary | ICD-10-CM

## 2016-08-11 DIAGNOSIS — E1159 Type 2 diabetes mellitus with other circulatory complications: Secondary | ICD-10-CM

## 2016-08-11 DIAGNOSIS — E7800 Pure hypercholesterolemia, unspecified: Secondary | ICD-10-CM

## 2016-08-11 NOTE — Assessment & Plan Note (Signed)
S: no chest pain or shortness of breath. Continuing to follow with [facility] A/P: asymptomatic- continue cardiology follow up. Continue aspirin, statin, coreg

## 2016-08-11 NOTE — Patient Instructions (Addendum)
Great job with losing 4 lbs. Lets target another 4 lbs off at least by next visit.   Blood pressure looking better  All other meds stable.   ______________________________________________________________________  Starting October 1st 2018, I will be transferring to our new location: [place] [address]- [place]) [place], [place] [id number] Phone: [phone]  I would love to have you remain my patient at this new location as long as it remains convenient for you. I am excited about the opportunity to have x-ray and sports medicine in the new building but will really miss the awesome staff and physicians at [facility]. Continue to schedule appointments at [facility] and we will automatically transfer them to the [place] location starting October 1st.

## 2016-08-11 NOTE — Progress Notes (Signed)
Subjective:  Zachary Warner is a 61 y.o. year old very pleasant male patient who presents for/with See problem oriented charting ROS- No chest pain or shortness of breath. No headache or blurry vision.    Past Medical History-  Patient Active Problem List   Diagnosis Date Noted  . CAD (coronary artery disease) 02/15/2015    Priority: High  . Type 2 diabetes mellitus with circulatory disorder, with long-term current use of insulin (HCC) 02/04/2006    Priority: High  . Hyperlipidemia 02/04/2006    Priority: Medium  . Essential hypertension 02/04/2006    Priority: Medium  . Nephrolithiasis     Priority: Low  . 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 RECTAL FISSURE 06/05/2006    Priority: Low   Medications- reviewed and updated Current Outpatient Prescriptions  Medication Sig Dispense Refill  . aspirin 81 MG tablet Take 81 mg by mouth daily.     Marland Kitchen. atorvastatin (LIPITOR) 40 MG tablet Take 1 tablet (40 mg total) by mouth every other day. 90 tablet 1  . carvedilol (COREG) 3.125 MG tablet Take 3.125 mg by mouth 2 (two) times daily.  11  . EQ FIBER SUPPLEMENT PO Take by mouth.    . fluticasone (FLONASE) 50 MCG/ACT nasal spray Place into both nostrils daily as needed for allergies or rhinitis.    Marland Kitchen. glucose blood test strip 1 each by Other route as needed. Use as instructed     . HUMALOG KWIKPEN 100 UNIT/ML KiwkPen INJECT 20 UNITS INTO THE SKIN WITH A SMALL MEAL, 28 UNITS WITH A REGULAR MEAL AND 42 UNITS WITH A LARGE MEAL. 30 mL 3  . Insulin Glargine (BASAGLAR KWIKPEN) 100 UNIT/ML SOPN Inject 80 units into the skin daily at 10 pm. 10 pen 5  . Insulin Pen Needle (CLICKFINE PEN NEEDLES) 32G X 4 MM MISC Use 3x a day 200 each 11  . INVOKANA 300 MG TABS tablet Take 1 tablet by mouth daily before breakfast. 30 tablet 3  . lisinopril (PRINIVIL,ZESTRIL) 40 MG tablet TAKE ONE TABLET BY MOUTH ONCE DAILY 90 tablet 1  . metFORMIN (GLUCOPHAGE) 1000 MG tablet TAKE 1 TABLET BY MOUTH TWICE DAILY WITH  MEALS. 180 tablet 2  . Omega-3 Fatty Acids (FISH OIL) 1000 MG CAPS Take 4 capsules by mouth daily.     Objective: BP 132/70 (BP Location: Left Arm, Patient Position: Sitting, Cuff Size: Large)   Pulse 75   Temp 98.3 F (36.8 C) (Oral)   Ht 5\' 10"  (1.778 m)   Wt 246 lb (111.6 kg)   SpO2 96%   BMI 35.30 kg/m  Gen: NAD, resting comfortably CV: RRR no murmurs rubs or gallops Lungs: CTAB no crackles, wheeze, rhonchi obese Ext: no edema Skin: warm, dry Neuro: grossly normal, moves all extremities  Assessment/Plan:  Down 4 lbs- encouraged continued weight loss   Essential hypertension S: controlled on coreg 3.125mg  BID, lisinopril 40mg .  BP Readings from Last 3 Encounters:  08/11/16 132/70  07/05/16 122/72  04/28/16 120/76  A/P:Continue current meds:  Improved control last few checks- last visit here higher 130s  Hyperlipidemia S: well controlled on Atorvastatin 20mg  every other day (myalgias if takes daily) Lab Results  Component Value Date   CHOL 134 02/04/2016   HDL 36.20 (L) 02/04/2016   LDLCALC 69 02/04/2016   LDLDIRECT 79.5 03/07/2013   TRIG 146.0 02/04/2016   CHOLHDL 4 02/04/2016   A/P: LDL at goal under 70 continue   CAD (coronary  artery disease) S: no chest pain or shortness of breath. Continuing to follow with [facility] A/P: asymptomatic- continue cardiology follow up. Continue aspirin, statin, coreg   Type 2 diabetes mellitus with circulatory disorder, with long-term current use of insulin (HCC) Off fructosamine a1c was 6.2. Unable to use this to abstract a1c unfortunately  Return in about 6 months (around 02/11/2017) for physical. come fasting and we will update labs.  Return precautions advised.  [name], MD

## 2016-08-11 NOTE — Assessment & Plan Note (Signed)
S: controlled on coreg 3.125mg  BID, lisinopril 40mg .  BP Readings from Last 3 Encounters:  08/11/16 132/70  07/05/16 122/72  04/28/16 120/76  A/P:Continue current meds:  Improved control last few checks- last visit here higher 130s

## 2016-08-11 NOTE — Assessment & Plan Note (Signed)
Off fructosamine a1c was 6.2. Unable to use this to abstract a1c unfortunately

## 2016-08-11 NOTE — Assessment & Plan Note (Signed)
S: well controlled on Atorvastatin 20mg  every other day (myalgias if takes daily) Lab Results  Component Value Date   CHOL 134 02/04/2016   HDL 36.20 (L) 02/04/2016   LDLCALC 69 02/04/2016   LDLDIRECT 79.5 03/07/2013   TRIG 146.0 02/04/2016   CHOLHDL 4 02/04/2016   A/P: LDL at goal under 70 continue

## 2016-09-09 ENCOUNTER — Other Ambulatory Visit: Payer: Self-pay | Admitting: Internal Medicine

## 2016-10-04 ENCOUNTER — Other Ambulatory Visit: Payer: Self-pay

## 2016-10-04 MED ORDER — INSULIN ASPART 100 UNIT/ML FLEXPEN: PEN_INJECTOR | SUBCUTANEOUS | 1 refills | Status: DC

## 2016-10-06 ENCOUNTER — Other Ambulatory Visit: Payer: Self-pay | Admitting: Internal Medicine

## 2016-10-09 ENCOUNTER — Other Ambulatory Visit: Payer: Self-pay | Admitting: Internal Medicine

## 2016-11-06 ENCOUNTER — Ambulatory Visit (INDEPENDENT_AMBULATORY_CARE_PROVIDER_SITE_OTHER): Payer: 59 | Admitting: Internal Medicine

## 2016-11-06 VITALS — BP 140/78 | HR 77 | Wt 246.0 lb

## 2016-11-06 DIAGNOSIS — E785 Hyperlipidemia, unspecified: Secondary | ICD-10-CM

## 2016-11-06 DIAGNOSIS — Z794 Long term (current) use of insulin: Secondary | ICD-10-CM

## 2016-11-06 DIAGNOSIS — E1159 Type 2 diabetes mellitus with other circulatory complications: Secondary | ICD-10-CM

## 2016-11-06 NOTE — Patient Instructions (Signed)
Please continue: - Metformin 1000 mg 2x a day. - Invokana 300 mg daily before breakfast - Basaglar 80 units at bedtime (if you have had an active day, take only 65 units) - Humalog: 22 units before breakfast - may miss 30 units before lunch  - may miss 40-42 units before dinner  Please stop at the lab.  Please return in 4 months with your sugar log.

## 2016-11-06 NOTE — Progress Notes (Signed)
Patient ID: Zachary Warner, male   DOB: 12-Jun-1955, 61 y.o.   MRN: [id number]  HPI: Zachary Warner is a 61 y.o.-year-old male, returning for f/u for DM2, dx in ~1995, insulin-dependent since ~2005, uncontrolled, with complications (CAD - s/p AMI, DR). Last visit 3 mo ago.  Sugars were higher in last 1.5 mo b/c he has been in vacation ([place] cruise) and has a new grandbaby.  Last hemoglobin A1c was: 07/05/2016: HbA1c calculated from the fructosamine is 6.2% 04/05/2016: HbA1c calculated from fructosamine is excellent, at 6% 01/04/2016: HbA1c calculated from fructosamine is  6.55%. 11/10/2015: HbA1c calculated from fructosamine is 5.8%. 08/10/2015: HbA1c calculated from fructosamine is 7.05%. 04/09/2015: HbA1c calculated fructosamine (326): 7.1%, which is much more concordant with his sugar log.  Lab Results  Component Value Date   HGBA1C 8.2 (H) 02/04/2016   HGBA1C 8.9 04/09/2015   HGBA1C 8.3 01/06/2015  He rotates the sites.   He is on: - Metformin 1000 mg 2x a day. - Invokana 300 mg daily before breakfast  - started med 01/2014 >> had 4 yeast inf since he started >> Diflucan tx. Not in last year, though. - Basaglar 80 units at bedtime (if active, take 65 units only) - Humalog: 22 units before breakfast - may miss 30 units before lunch  - may miss 40-42 units before dinner We stopped Januvia 2/2 large doses of mealtime insulin.  Pt was checking his sugars 1-3x a day - no log this time): - am: 101-146, 167 >> 74 >> 94-143, 152, 180 >> 130s - 2h after b'fast: >> 138-155 >> n/c >> 134, 148 >> n/c - lunch: 130-160 >> 130-160 >> n/c >> 96-130 >> n/c - 2h after lunch: 149-198 >> n/c >> 119, 198 >> n/c  - dinner: 1119-183 (snack in the pm) >> 151-179 (after snack) >> n/c - 2h after dinner: 160-180 >> 160 >> n/c >> 160-179 >> n/c - bedtime:172, 187 >> n/c >> 176-202 >> n/c >> 191 >> 185, 190 - nighttime: 64, 75 >> n/c >> 64 (more active the previous day) No lows; lowest 55 (x1 at 3 am)  >> 74 >> 64 x2 at night >> 100; has hypoglycemia awareness at 60.  Highest sugar was 198 >> 191 >> 190  He has a ReliOn meter.  Pt's meals are: - Breakfast: grits or sandwich - Lunch: sandwich - Dinner: chicken or beef or pork + vegetables - Snacks: 1 or 2 snacks  - crackers, peanuts or cookies He walks for exercise.   - No h/o CKD, last BUN/creatinine:  Lab Results  Component Value Date   BUN 15 02/04/2016   CREATININE 0.89 02/04/2016  On Lisinopril.  - last set of lipids: Lab Results  Component Value Date   CHOL 134 02/04/2016   HDL 36.20 (L) 02/04/2016   LDLCALC 69 02/04/2016   LDLDIRECT 79.5 03/07/2013   TRIG 146.0 02/04/2016   CHOLHDL 4 02/04/2016  On Lipitor 40 mg qod and also Fish oil 2 capsules bid.   - reviewed last eye exam: Dr Ambrose Mantle [facility]): Component Date Value  . HM Diabetic Eye Exam 02/08/2016 No Retinopathy    - he denies numbness and tingling in his feet.  He also has a history of HTN, HL, h/o nephrolithiasis x 3, last in 03/2013, OSA. He had an AMI 07/2014 >> stent. Cardiologist: Dr Sedonia Small.  ROS: Constitutional: no weight gain/no weight loss, no fatigue, no subjective hyperthermia, no subjective hypothermia Eyes: no blurry vision, no xerophthalmia  ENT: no sore throat, no nodules palpated in throat, no dysphagia, no odynophagia, no hoarseness Cardiovascular: no CP/no SOB/no palpitations/no leg swelling Respiratory: no cough/no SOB/no wheezing Gastrointestinal: no N/no V/no D/no C/no acid reflux Musculoskeletal: no muscle aches/no joint aches Skin: no rashes, no hair loss Neurological: no tremors/no numbness/no tingling/no dizziness  I reviewed pt's medications, allergies, PMH, social hx, family hx, and changes were documented in the history of present illness. Otherwise, unchanged from my initial visit note.  PE: BP 140/78 (BP Location: Left Arm, Patient Position: Sitting)   Pulse 77   Wt 246 lb (111.6 kg)   SpO2 95%   BMI  35.30 kg/m  Body mass index is 35.3 kg/m. Wt Readings from Last 3 Encounters:  11/06/16 246 lb (111.6 kg)  08/11/16 246 lb (111.6 kg)  07/05/16 245 lb (111.1 kg)   Constitutional: overweight, in NAD Eyes: PERRLA, EOMI, no exophthalmos ENT: moist mucous membranes, no thyromegaly, no cervical lymphadenopathy Cardiovascular: RRR, No MRG Respiratory: CTA B Gastrointestinal: abdomen soft, NT, ND, BS+ Musculoskeletal: no deformities, strength intact in all 4 Skin: moist, warm, no rashes Neurological: no tremor with outstretched hands, DTR normal in all 4  ASSESSMENT: 1. DM2, insulin-dependent, uncontrolled, with complications - CAD, s/p AMi - DR  2. HL  PLAN:  1. Patient with long-standing, Controlled diabetes, with last HbA1c calculated from the fructosamine being excellent, at 6.2%. He was doing a great job checking sugars daily, at different times of the day and bringing a very good log at last visits, however, in the last month and a half, he only checked sugars sporadically, as he was on vacation. He did not miss many doses of his insulin. - For now, will continue the current regimen - I suggested to:  Patient Instructions  Please continue: - Metformin 1000 mg 2x a day. - Invokana 300 mg daily before breakfast - Basaglar 80 units at bedtime (if you have had an active day, take only 65 units) - Humalog: 24 units before breakfast 32 units before lunch  42 units before dinner  Please stop at the lab.  Please return in 4 months with your sugar log.   - today will check a fructosamine - continue checking sugars at different times of the day - check 3x a day, rotating checks - advised for yearly eye exams >> he is UTD - Return to clinic in 4 mo with sugar log   2. HL - Last LDL reviewed and this is at goal, on Lipitor and fish oil.  Office Visit on 11/06/2016  Component Date Value Ref Range Status  . Fructosamine 11/06/2016 295* 190 - 270 umol/L Final   HbA1c  calculated from the fructosamine is slightly higher, but still good, at 6.6%  Zachary Pavlovristina Gerry Heaphy, MD PhD [facility]

## 2016-11-08 LAB — FRUCTOSAMINE: Fructosamine: 295 umol/L — ABNORMAL HIGH (ref 190–270)

## 2016-11-09 ENCOUNTER — Telehealth: Payer: Self-pay

## 2016-11-09 ENCOUNTER — Encounter: Payer: Self-pay | Admitting: Internal Medicine

## 2016-11-09 NOTE — Telephone Encounter (Signed)
Called patient and gave lab results. Patient had no questions or concerns.  

## 2016-11-09 NOTE — Telephone Encounter (Signed)
-----   Message from Carlus Pavlovristina Gherghe, MD sent at 11/09/2016 12:56 PM EDT ----- Zachary FanningJulie, can you please call pt: HbA1c calculated from the fructosamine is slightly higher, but still good, at 6.6%

## 2016-12-11 ENCOUNTER — Other Ambulatory Visit: Payer: Self-pay | Admitting: Internal Medicine

## 2016-12-11 ENCOUNTER — Other Ambulatory Visit: Payer: Self-pay | Admitting: Family Medicine

## 2017-01-09 ENCOUNTER — Other Ambulatory Visit: Payer: Self-pay | Admitting: Internal Medicine

## 2017-01-12 ENCOUNTER — Other Ambulatory Visit: Payer: Self-pay | Admitting: Internal Medicine

## 2017-01-15 ENCOUNTER — Other Ambulatory Visit: Payer: Self-pay | Admitting: Internal Medicine

## 2017-01-29 DIAGNOSIS — I252 Old myocardial infarction: Secondary | ICD-10-CM | POA: Insufficient documentation

## 2017-01-29 DIAGNOSIS — I219 Acute myocardial infarction, unspecified: Secondary | ICD-10-CM | POA: Insufficient documentation

## 2017-02-08 LAB — HM DIABETES EYE EXAM

## 2017-02-12 ENCOUNTER — Encounter: Payer: Self-pay | Admitting: Family Medicine

## 2017-02-12 ENCOUNTER — Other Ambulatory Visit: Payer: Self-pay | Admitting: Internal Medicine

## 2017-02-12 ENCOUNTER — Ambulatory Visit (INDEPENDENT_AMBULATORY_CARE_PROVIDER_SITE_OTHER): Payer: 59 | Admitting: Family Medicine

## 2017-02-12 VITALS — BP 120/70 | HR 81 | Temp 98.4°F | Ht 69.5 in | Wt 246.5 lb

## 2017-02-12 DIAGNOSIS — E785 Hyperlipidemia, unspecified: Secondary | ICD-10-CM | POA: Diagnosis not present

## 2017-02-12 DIAGNOSIS — I2510 Atherosclerotic heart disease of native coronary artery without angina pectoris: Secondary | ICD-10-CM | POA: Diagnosis not present

## 2017-02-12 DIAGNOSIS — Z125 Encounter for screening for malignant neoplasm of prostate: Secondary | ICD-10-CM | POA: Diagnosis not present

## 2017-02-12 DIAGNOSIS — I10 Essential (primary) hypertension: Secondary | ICD-10-CM

## 2017-02-12 DIAGNOSIS — Z794 Long term (current) use of insulin: Secondary | ICD-10-CM

## 2017-02-12 DIAGNOSIS — Z0000 Encounter for general adult medical examination without abnormal findings: Secondary | ICD-10-CM

## 2017-02-12 DIAGNOSIS — E1159 Type 2 diabetes mellitus with other circulatory complications: Secondary | ICD-10-CM

## 2017-02-12 DIAGNOSIS — E113299 Type 2 diabetes mellitus with mild nonproliferative diabetic retinopathy without macular edema, unspecified eye: Secondary | ICD-10-CM

## 2017-02-12 DIAGNOSIS — E11319 Type 2 diabetes mellitus with unspecified diabetic retinopathy without macular edema: Secondary | ICD-10-CM | POA: Insufficient documentation

## 2017-02-12 HISTORY — DX: Type 2 diabetes mellitus with unspecified diabetic retinopathy without macular edema: E11.319

## 2017-02-12 LAB — CBC
HCT: 44.6 % (ref 39.0–52.0)
HEMOGLOBIN: 14.9 g/dL (ref 13.0–17.0)
MCHC: 33.3 g/dL (ref 30.0–36.0)
MCV: 87 fl (ref 78.0–100.0)
Platelets: 204 10*3/uL (ref 150.0–400.0)
RBC: 5.13 Mil/uL (ref 4.22–5.81)
RDW: 14.5 % (ref 11.5–15.5)
WBC: 6.6 10*3/uL (ref 4.0–10.5)

## 2017-02-12 LAB — COMPREHENSIVE METABOLIC PANEL
ALK PHOS: 47 U/L (ref 39–117)
ALT: 18 U/L (ref 0–53)
AST: 13 U/L (ref 0–37)
Albumin: 4.3 g/dL (ref 3.5–5.2)
BILIRUBIN TOTAL: 0.8 mg/dL (ref 0.2–1.2)
BUN: 20 mg/dL (ref 6–23)
CO2: 29 mEq/L (ref 19–32)
Calcium: 10.2 mg/dL (ref 8.4–10.5)
Chloride: 103 mEq/L (ref 96–112)
Creatinine, Ser: 0.86 mg/dL (ref 0.40–1.50)
GFR: 95.93 mL/min (ref >60.00)
GLUCOSE: 169 mg/dL — AB (ref 70–99)
Potassium: 4.5 mEq/L (ref 3.5–5.1)
SODIUM: 139 meq/L (ref 135–145)
Total Protein: 6.8 g/dL (ref 6.0–8.3)

## 2017-02-12 LAB — LIPID PANEL
Cholesterol: 145 mg/dL (ref 0–200)
HDL: 34.5 mg/dL — ABNORMAL LOW (ref >39.00)
LDL Cholesterol: 75 mg/dL (ref 0–99)
NONHDL: 110.89
Total CHOL/HDL Ratio: 4
Triglycerides: 180 mg/dL — ABNORMAL HIGH (ref 0.0–149.0)
VLDL: 36 mg/dL (ref 0.0–40.0)

## 2017-02-12 LAB — PSA: PSA: 0.7 ng/mL (ref 0.10–4.00)

## 2017-02-12 NOTE — Assessment & Plan Note (Signed)
HTN- controlled on lisinopril 40mg  and coreg 3.125mg  BID. Prefer systolics under 130 if possible- looks great today BP Readings from Last 3 Encounters:  02/12/17 120/70  11/06/16 140/78  08/11/16 132/70

## 2017-02-12 NOTE — Patient Instructions (Addendum)
Please follow up with dentist  Foot exam  I would love for you to work on getting at least 10 lbs off in the next 6 months- see me at that time to check in

## 2017-02-12 NOTE — Progress Notes (Signed)
Phone: [phone]  Subjective:  Patient presents today for their annual physical. Chief complaint-noted.   See problem oriented charting- ROS- full  review of systems was completed and negative except for: seasonal allergies- sneezing in mornings and with dog close  The following were reviewed and entered/updated in epic: Past Medical History:  Diagnosis Date  . Allergy   . Diabetes mellitus   . Hyperlipidemia   . Hypertension   . Myocardial infarction [facility])    May 2016  . Nephrolithiasis    3x 2007-2015, passed on his own  . Sleep apnea    wears CPAP   Patient Active Problem List   Diagnosis Date Noted  . CAD (coronary artery disease) 02/15/2015    Priority: High  . Type 2 diabetes mellitus with circulatory disorder, with long-term current use of insulin (HCC) 02/04/2006    Priority: High  . Hyperlipidemia 02/04/2006    Priority: Medium  . Essential hypertension 02/04/2006    Priority: Medium  . Nephrolithiasis     Priority: Low  . 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 RECTAL FISSURE 06/05/2006    Priority: Low   Past Surgical History:  Procedure Laterality Date  . CARDIAC CATHETERIZATION     stent May 2016  . COLONOSCOPY    . heart stent     May 2016  . HERNIA REPAIR      Family History  Problem Relation Age of Onset  . Coronary artery disease Father   . Liver disease Father        from heart meds per pt  . Diabetes Sister        grandmother  . Kidney disease Sister        dialysis from dm  . Colon cancer Neg Hx   . Esophageal cancer Neg Hx   . Rectal cancer Neg Hx   . Stomach cancer Neg Hx     Medications- reviewed and updated Current Outpatient Medications  Medication Sig Dispense Refill  . aspirin 81 MG tablet Take 81 mg by mouth daily.     Marland Kitchen atorvastatin (LIPITOR) 40 MG tablet Take 1 tablet (40 mg total) by mouth every other day. 90 tablet 1  . carvedilol (COREG) 3.125 MG tablet Take 3.125 mg by mouth 2 (two) times daily.  11  .  EQ FIBER SUPPLEMENT PO Take by mouth.    . fluticasone (FLONASE) 50 MCG/ACT nasal spray Place into both nostrils daily as needed for allergies or rhinitis.    Marland Kitchen glucose blood test strip 1 each by Other route as needed. Use as instructed     . insulin aspart (NOVOLOG FLEXPEN) 100 UNIT/ML FlexPen INJECT 20 UNITS INTO THE SKIN WITH A SMALL MEAL, 28 UNITS WITH A REGULAR MEAL AND 42 UNITS WITH A LARGE MEAL., 30 mL 1  . Insulin Glargine (BASAGLAR KWIKPEN) 100 UNIT/ML SOPN Inject 80 units into the skin daily at 10 pm. 30 mL 2  . Insulin Pen Needle (CLICKFINE PEN NEEDLES) 32G X 4 MM MISC Use 3x a day 200 each 11  . INVOKANA 300 MG TABS tablet Take 1 tablet by mouth daily before breakfast. 30 tablet 3  . lisinopril (PRINIVIL,ZESTRIL) 40 MG tablet TAKE ONE TABLET BY MOUTH ONCE DAILY 90 tablet 1  . metFORMIN (GLUCOPHAGE) 1000 MG tablet TAKE 1 TABLET BY MOUTH TWICE DAILY WITH MEALS. 180 tablet 1  . Omega-3 Fatty Acids (FISH OIL) 1000 MG CAPS Take 4 capsules by mouth daily.  Current Facility-Administered Medications  Medication Dose Route Frequency Provider Last Rate Last Dose  . 0.9 %  sodium chloride infusion  500 mL Intravenous Continuous Iva BoopGessner, Carl E, MD        Allergies-reviewed and updated Allergies  Allergen Reactions  . Invokana [Canagliflozin] Other (See Comments)    Yeast infection    Social History   Socioeconomic History  . Marital status: Married    Spouse name: None  . Number of children: None  . Years of education: None  . Highest education level: None  Social Needs  . Financial resource strain: None  . Food insecurity - worry: None  . Food insecurity - inability: None  . Transportation needs - medical: None  . Transportation needs - non-medical: None  Occupational History  . None  Tobacco Use  . Smoking status: Never Smoker  . Smokeless tobacco: Never Used  Substance and Sexual Activity  . Alcohol use: No  . Drug use: No  . Sexual activity: No  Other Topics  Concern  . None  Social History Narrative   Married. 2 children son and daughter. Granddaughter 12/02/2014 born and grandson 7/17//2018.       Works Administrator, [employer]      Hobbies: golfing (low to mid 80s)    Objective: BP 120/70 (BP Location: Left Arm, Patient Position: Sitting, Cuff Size: Large)   Pulse 81   Temp 98.4 F (36.9 C) (Oral)   Ht 5' 9.5" (1.765 m)   Wt 246 lb 8 oz (111.8 kg)   SpO2 97%   BMI 35.88 kg/m  Gen: NAD, resting comfortably HEENT: Mucous membranes are moist. Oropharynx normal Neck: no thyromegaly CV: RRR no murmurs rubs or gallops Lungs: CTAB no crackles, wheeze, rhonchi Abdomen: soft/nontender/nondistended/normal bowel sounds. No rebound or guarding. Obese morbidly  Ext: no edema Skin: warm, dry Neuro: grossly normal, moves all extremities, PERRLA Rectal: normal tone, normal sized prostate, no masses or tenderness  Diabetic Foot Exam - Simple   Simple Foot Form Diabetic Foot exam was performed with the following findings:  Yes 02/12/2017  8:45 AM  Visual Inspection No deformities, no ulcerations, no other skin breakdown bilaterally:  Yes Sensation Testing Intact to touch and monofilament testing bilaterally:  Yes Pulse Check Posterior Tibialis and Dorsalis pulse intact bilaterally:  Yes Comments    Assessment/Plan:  61 y.o. male presenting for annual physical.  Health Maintenance counseling: 1. Anticipatory guidance: Patient counseled regarding regular dental exams q6 months- needs to update dental exam has been a year , eye exams with Dr. Marina GravelHandley yearly- had last thursday, wearing seatbelts.  2. Risk factor reduction:  Advised patient of need for regular exercise and diet rich and fruits and vegetables to reduce risk of heart attack and stroke. Exercise- 5-6k steps most days, over 10k on golf days. Recommended 150 minutes a week. Diet- had discussed 10 lbs off last year- is at least stable Wt Readings from Last 3 Encounters:    02/12/17 246 lb 8 oz (111.8 kg)  11/06/16 246 lb (111.6 kg)  08/11/16 246 lb (111.6 kg)  3. Immunizations/screenings/ancillary studies- Flu shot given. Discussed shingrix availability issues- likely next year.  Immunization History  Administered Date(s) Administered  . Influenza Split 04/17/2011  . Influenza Whole 01/02/2007, 01/07/2009, 02/04/2010, 01/06/2012  . Influenza,inj,Quad PF,6+ Mos 03/14/2013, 02/03/2014, 01/06/2015  . Influenza-Unspecified 12/25/2012, 01/28/2016, 01/13/2017  . Pneumococcal Conjugate-13 07/15/2013  . Pneumococcal Polysaccharide-23 05/11/2008, 08/19/2014  . Tdap 07/15/2013  4. Prostate cancer screening- update psa  trend. Low risk exam today  Lab Results  Component Value Date   PSA 0.42 02/04/2016   5. Colon cancer screening - 04/28/16 with 10 year repeat 6. Skin cancer screening-  Denies worrisome, changing, or new skin lesions. Waist up exam today without obvious cancerous or precancerous lesions - declines dermatology  Status of chronic or acute concerns   CAD (coronary artery disease) [facility]  compliant with aspirin and statin (atorvastatin 40mg  every other day with myalgias more frequent). Compliant with coreg as well. LDL goal <70 and was at goal last year- update today. Last stent to OM1 in 2016 drug eluting after MI, had moderate disease in LAD  Essential hypertension HTN- controlled on lisinopril 40mg  and coreg 3.125mg  BID. Prefer systolics under 130 if possible- looks great today BP Readings from Last 3 Encounters:  02/12/17 120/70  11/06/16 140/78  08/11/16 132/70     Type 2 diabetes mellitus with circulatory disorder, with long-term current use of insulin (HCC) S: followed by endocrine. Uses fructosamine calculation- a1c calculation of 6.6. On basaglar long acting, lispro short acting, metformin 1g BID, invokana 300mg . Denies lows   A/P: a1c not helpful so will defer.  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 Elvera LennoxGherghe (and all specialists)- he agrees to see at least once a ear- we may need to work on a joint plan  Diabetic retinopathy (HCC) Follows with optho at least yearly- stable exam this year- will be abstracted   Future Appointments  Date Time Provider Department Center  03/08/2017  9:00 AM Carlus PavlovGherghe, Cristina, MD [facility] None   6 months advised- he states dec 1st insurance changes so may try to move visit up with Dr. Elvera LennoxGherghe  Orders Placed This Encounter  Procedures  . CBC    [place]  . Comprehensive metabolic panel    [place]    Order Specific Question:   Has the patient fasted?    Answer:   No  . Lipid panel    [place]    Order Specific Question:   Has the patient fasted?    Answer:   No  . PSA   Return precautions advised.  Tana ConchStephen Jhaniya Briski, MD

## 2017-02-12 NOTE — Assessment & Plan Note (Signed)
Follows with optho at least yearly- stable exam this year- will be abstracted

## 2017-02-12 NOTE — Assessment & Plan Note (Signed)
[facility].  compliant with aspirin and statin (atorvastatin 40mg  every other day with myalgias more frequent). Compliant with coreg as well. LDL goal <70 and was at goal last year- update today. Last stent to OM1 in 2016 drug eluting after MI, had moderate disease in LAD

## 2017-02-12 NOTE — Assessment & Plan Note (Signed)
S: followed by endocrine. Uses fructosamine calculation- a1c calculation of 6.6. On basaglar long acting, lispro short acting, metformin 1g BID, invokana 300mg . Denies lows   A/P: a1c not helpful so will defer.  -get eye exam records- mild non proliferative retinopathy without macular edema - foot exam today  - unfortunately he will have to pay full cost to see Dr. Elvera LennoxGherghe (and all specialists)- he agrees to see at least once a ear- we may need to work on a joint plan

## 2017-02-14 ENCOUNTER — Encounter: Payer: Self-pay | Admitting: Family Medicine

## 2017-02-21 ENCOUNTER — Telehealth: Payer: Self-pay | Admitting: Internal Medicine

## 2017-02-21 MED ORDER — INSULIN ASPART 100 UNIT/ML FLEXPEN: PEN_INJECTOR | SUBCUTANEOUS | 1 refills | Status: DC

## 2017-02-21 NOTE — Telephone Encounter (Signed)
Sent his Novolog to pharmacy, patient is not on Humalog, and we never received a fax from pharmacy.

## 2017-02-21 NOTE — Telephone Encounter (Signed)
Patient's prescription refill request for Humalog was denied by our office. Please call patient at ph# [phone] to advise.

## 2017-02-21 NOTE — Telephone Encounter (Signed)
Pharmacy faxed  Request for Humalog pens and it was denied, he has only one left  Called [facility]

## 2017-02-21 NOTE — Telephone Encounter (Signed)
Insurance will not cover Novalog. The insurance company said they will cover Humalog, which is what the patient has been on. Please call patient and advise -he is out of medication and please call prescription for Humalog into [facility] °Also-Pharmacy requested refill for Humalog but our office denied, per pharmacy °

## 2017-02-21 NOTE — Telephone Encounter (Signed)
Note being sent to Dr. Elvera LennoxGherghe about the change in medication because of a PA request recieved

## 2017-02-21 NOTE — Telephone Encounter (Signed)
See other TC dated 02/21/17. Pending Dr. Althea CharonGherghes ok.

## 2017-02-21 NOTE — Telephone Encounter (Signed)
Patient has question about insulin Novalog or Humalogwith his insurance co, there is some confusion about which insulin he supposed to be taking. Please advise

## 2017-02-21 NOTE — Telephone Encounter (Signed)
Spoke with patient because this has been handled, he is Landscape architectcalling pharmacy

## 2017-02-21 NOTE — Telephone Encounter (Signed)
Insurance will not cover Novalog. The insurance company said they will cover Humalog, which is what the patient has been on. Please call patient and advise -he is out of medication and please call prescription for Humalog into [facility] Also-Pharmacy requested refill for Humalog but our office denied, per pharmacy

## 2017-02-22 MED ORDER — INSULIN LISPRO 100 UNIT/ML (KWIKPEN): PEN_INJECTOR | SUBCUTANEOUS | 3 refills | Status: DC

## 2017-02-22 NOTE — Telephone Encounter (Signed)
Humalog sent to pharmacy per Dr. Elvera LennoxGherghe

## 2017-02-22 NOTE — Telephone Encounter (Signed)
Pt is aware.  

## 2017-03-01 NOTE — Telephone Encounter (Signed)
Zachary OliphantCaleb is calling on the status of the PA for the Novalog  ref key [id number]

## 2017-03-01 NOTE — Telephone Encounter (Signed)
Changed his medication no PA needed

## 2017-03-08 ENCOUNTER — Encounter: Payer: Self-pay | Admitting: Internal Medicine

## 2017-03-08 ENCOUNTER — Ambulatory Visit (INDEPENDENT_AMBULATORY_CARE_PROVIDER_SITE_OTHER): Payer: 59 | Admitting: Internal Medicine

## 2017-03-08 VITALS — BP 120/60 | HR 80 | Ht 70.25 in | Wt 246.6 lb

## 2017-03-08 DIAGNOSIS — E785 Hyperlipidemia, unspecified: Secondary | ICD-10-CM

## 2017-03-08 DIAGNOSIS — E1159 Type 2 diabetes mellitus with other circulatory complications: Secondary | ICD-10-CM | POA: Diagnosis not present

## 2017-03-08 DIAGNOSIS — Z794 Long term (current) use of insulin: Secondary | ICD-10-CM

## 2017-03-08 LAB — POCT GLYCOSYLATED HEMOGLOBIN (HGB A1C): Hemoglobin A1C: 8.1

## 2017-03-08 MED ORDER — INSULIN LISPRO 100 UNIT/ML (KWIKPEN)
PEN_INJECTOR | SUBCUTANEOUS | 5 refills | Status: DC
Start: 2017-03-08 — End: 2017-07-16

## 2017-03-08 NOTE — Addendum Note (Signed)
Addended by: Adline MangoSTONE-ELMORE, Laverta Harnisch I on: 03/08/2017 09:46 AM   Modules accepted: Orders

## 2017-03-08 NOTE — Patient Instructions (Addendum)
Please continue: - Metformin 1000 mg 2x a day. - Invokana 300 mg daily before breakfast - Basaglar 80 units at bedtime - Humalog: 24 units before breakfast 32 units before lunch  (38 units for a larger meal) 42 units before dinner (48 units for a larger meal)  Please stop at the lab.  Please return in 4 months with your sugar log.

## 2017-03-08 NOTE — Addendum Note (Signed)
Addended by: Yolande JollyLAWSON, Koleton Duchemin on: 03/08/2017 02:41 PM   Modules accepted: Orders

## 2017-03-08 NOTE — Progress Notes (Addendum)
Patient ID: Zachary NorseLester W Majer, male   DOB: July 13, 1955, 61 y.o.   MRN: [id number]  HPI: Zachary Warner is a 61 y.o.-year-old male, returning for f/u for DM2, dx in ~1995, insulin-dependent since ~2005, uncontrolled, with complications (CAD - s/p AMI, DR). Last visit 4 mo ago.  Sugars are higher now during the Holidays.  Last hemoglobin A1c was: 11/06/2016: HbA1c calculated from the fructosamine is slightly higher, at 6.6% 07/05/2016: HbA1c calculated from the fructosamine is 6.2% 04/05/2016: HbA1c calculated from fructosamine is excellent, at 6% 01/04/2016: HbA1c calculated from fructosamine is  6.55%. 11/10/2015: HbA1c calculated from fructosamine is 5.8%. 08/10/2015: HbA1c calculated from fructosamine is 7.05%. 04/09/2015: HbA1c calculated fructosamine (326): 7.1%, which is much more concordant with his sugar log.  Lab Results  Component Value Date   HGBA1C 8.2 (H) 02/04/2016   HGBA1C 8.9 04/09/2015   HGBA1C 8.3 01/06/2015  He rotates the sites.   He is on: - Metformin 1000 mg 2x a day. - Invokana 300 mg daily before breakfast  - started med 01/2014 >> had 4 yeast inf since he started >> Diflucan tx. not recently. - Basaglar 80 units at bedtime (if active, take 65 units only) - Humalog: 22 units before breakfast - may miss 30 units before lunch  - may miss 40-42 units before dinner We stopped Januvia 2/2 large doses of mealtime insulin.  Pt was checking his sugars 1-3 times a day: - am: 101-146, 167 >> 74 >> 94-143, 152, 180 >> 130s >> 76, 95-177, 185, 187 (fruit late at night - pineapple and grapes) - 2h after b'fast: >> 138-155 >> n/c >> 134, 148 >> n/c - lunch: 130-160 >> 130-160 >> n/c >> 96-130 >> n/c - 2h after lunch: 149-198 >> n/c >> 119, 198 >> n/c  - dinner: 119-183 (snack in the pm) >> 151-179 (after snack) >> n/c >> 180, 200 (snacks) - 2h after dinner: 160-180 >> 160 >> n/c >> 160-179 >> n/c >> 160-286 - bedtime:172, 187 >> n/c >> 176-202 >> n/c >> 191 >> 185, 190 >>  129, 147-187, 241 - nighttime: 64, 75 >> n/c >> 64 (more active the previous day) Low sugar: 100 >> 95; has hypoglycemia awareness at 60.  Highest sugar was 190 >> 286.  He has a ReliOn meter.  Pt's meals are: - Breakfast: grits or sandwich - Lunch: sandwich - Dinner: chicken or beef or pork + vegetables - Snacks: 1 or 2 snacks  - crackers, peanuts or cookies He walks for exercise.   - No CKD, last BUN/creatinine:  Lab Results  Component Value Date   BUN 20 02/12/2017   CREATININE 0.86 02/12/2017  On lisinopril.  -+ HL; last set of lipids: Lab Results  Component Value Date   CHOL 145 02/12/2017   HDL 34.50 (L) 02/12/2017   LDLCALC 75 02/12/2017   LDLDIRECT 79.5 03/07/2013   TRIG 180.0 (H) 02/12/2017   CHOLHDL 4 02/12/2017  On Lipitor 40 mg every other day and also fish oil 2 capsules twice a day.  - Reviewed latest eye exam: [facility] Ambrose [facility]): Abstract on 02/14/2017  Component Date Value Ref Range Status  . HM Diabetic Eye Exam 02/08/2017 Retinopathy* No Retinopathy Final   Mild nonproliferative retinopathy OU   -He denies numbness and tingling in his feet.  He also has a history of HTN, HL, h/o nephrolithiasis x 3, last in 03/2013, OSA. He had an AMI 07/2014 >> stent. Cardiologist: Dr Sedonia SmallKrosowski.  ROS: Constitutional: no weight  gain/no weight loss, no fatigue, no subjective hyperthermia, no subjective hypothermia Eyes: no blurry vision, no xerophthalmia ENT: no sore throat, no nodules palpated in throat, no dysphagia, no odynophagia, no hoarseness Cardiovascular: no CP/no SOB/no palpitations/no leg swelling Respiratory: no cough/no SOB/no wheezing Gastrointestinal: no N/no V/no D/no C/no acid reflux Musculoskeletal: no muscle aches/no joint aches Skin: no rashes, no hair loss Neurological: no tremors/no numbness/no tingling/no dizziness  I reviewed pt's medications, allergies, PMH, social hx, family hx, and changes were documented in the history of  present illness. Otherwise, unchanged from my initial visit note.  PE: BP 120/60   Pulse 80   Ht 5' 10.25" (1.784 m)   Wt 246 lb 9.6 oz (111.9 kg)   SpO2 97%   BMI 35.13 kg/m  Body mass index is 35.13 kg/m. Wt Readings from Last 3 Encounters:  03/08/17 246 lb 9.6 oz (111.9 kg)  02/12/17 246 lb 8 oz (111.8 kg)  11/06/16 246 lb (111.6 kg)   Constitutional: overweight, in NAD Eyes: PERRLA, EOMI, no exophthalmos ENT: moist mucous membranes, no thyromegaly, no cervical lymphadenopathy Cardiovascular: RRR, No MRG Respiratory: CTA B Gastrointestinal: abdomen soft, NT, ND, BS+ Musculoskeletal: no deformities, strength intact in all 4 Skin: moist, warm, no rashes Neurological: no tremor with outstretched hands, DTR normal in all 4  ASSESSMENT: 1. DM2, insulin-dependent, controlled, with complications - CAD, s/p AMi - DR  2. HL  PLAN:  1. Patient with long-standing, controlled, diabetes, with last HbA1c calculated from fructosamine at goal, lower than 7%.  He is doing a great job checking sugars at different times of the day and bringing a very good log.  At last visit, his HbA1c was slightly higher  As he was not checking sugars consistently and missing some insulin doses, but now he restarted. - Sugars are higher at this visit >> dietary indiscretions during the Holidays. Will increase the Humalog with a larger meal but OTW, We will continue his current regimen - I suggested to:  Patient Instructions  Please continue: - Metformin 1000 mg 2x a day. - Invokana 300 mg daily before breakfast - Basaglar 80 units at bedtime - Humalog: 24 units before breakfast 32 units before lunch  (38 units for a larger meal) 42 units before dinner (48 units for a larger meal)  Please stop at the lab.  Please return in 4 months with your sugar log.   - today we will check his fructosamine; an HbA1c is 8.1%  - continue checking sugars at different times of the day - check 3x a day, rotating  checks - advised for yearly eye exams >> he is UTD - UTD with flu shot - Return to clinic in 4 mo with sugar log    2. HL -Latest LDL was reviewed and this was at goal on Lipitor every other day and fish oil twice a day -No side effects from the statin  Office Visit on 03/08/2017  Component Date Value Ref Range Status  . Fructosamine 03/08/2017 290* 190 - 270 umol/L Final  . Hemoglobin A1C 03/08/2017 8.1   Final   HbA1c calculated from the fructosamine is 6.5%.   Zachary Pavlovristina Reisa Coppola, MD PhD [facility]

## 2017-03-11 LAB — FRUCTOSAMINE: Fructosamine: 290 umol/L — ABNORMAL HIGH (ref 190–270)

## 2017-03-16 ENCOUNTER — Telehealth: Payer: Self-pay

## 2017-03-16 NOTE — Telephone Encounter (Signed)
Invokana is not in the same class of medicines with the rest of the meds listed.  It is in the same class with Jardiance (preferred, if we can do this, we need 25 mg daily) or farxiga (need 10 mg daily)., or Steglatro, which is the newest, but I do not think this is covered.

## 2017-03-16 NOTE — Telephone Encounter (Signed)
Invokana was denied per insurance. Gust RungNesina, Onglyza, or Tradjenta are the listed alternatives. Please advise

## 2017-03-19 MED ORDER — EMPAGLIFLOZIN 25 MG PO TABS: 25.0000 mg | ORAL_TABLET | Freq: Every day | ORAL | 0 refills | Status: DC

## 2017-03-19 NOTE — Telephone Encounter (Signed)
Pt is aware that I sent Jardiance to his pharmacy

## 2017-04-16 ENCOUNTER — Other Ambulatory Visit: Payer: Self-pay | Admitting: Internal Medicine

## 2017-05-15 ENCOUNTER — Other Ambulatory Visit: Payer: Self-pay | Admitting: Internal Medicine

## 2017-05-15 ENCOUNTER — Other Ambulatory Visit: Payer: Self-pay | Admitting: Family Medicine

## 2017-05-15 DIAGNOSIS — E785 Hyperlipidemia, unspecified: Secondary | ICD-10-CM

## 2017-05-21 ENCOUNTER — Other Ambulatory Visit: Payer: Self-pay | Admitting: Internal Medicine

## 2017-05-22 ENCOUNTER — Telehealth: Payer: Self-pay | Admitting: Internal Medicine

## 2017-05-22 MED ORDER — FLUCONAZOLE 150 MG PO TABS: 150.0000 mg | ORAL_TABLET | Freq: Once | ORAL | 1 refills | Status: DC

## 2017-05-22 NOTE — Telephone Encounter (Signed)
Please advise on below  

## 2017-05-22 NOTE — Telephone Encounter (Signed)
Pt is aware and medication is sent

## 2017-05-22 NOTE — Telephone Encounter (Signed)
OK to call in Diflucan 150 mg #1 tab with 1 refill.

## 2017-05-22 NOTE — Telephone Encounter (Signed)
Pt stated he is having a reaction to the medication the dr put him on . JARDIANCE 25 MG TABS tablet States that insurance made him change to this medication because this is what they would cover.  He states he has a yeast infection .  Please advise

## 2017-06-04 ENCOUNTER — Other Ambulatory Visit: Payer: Self-pay | Admitting: Internal Medicine

## 2017-06-18 ENCOUNTER — Other Ambulatory Visit: Payer: Self-pay | Admitting: Internal Medicine

## 2017-06-18 ENCOUNTER — Other Ambulatory Visit: Payer: Self-pay | Admitting: Family Medicine

## 2017-07-06 ENCOUNTER — Ambulatory Visit: Payer: 59 | Admitting: Endocrinology

## 2017-07-11 ENCOUNTER — Ambulatory Visit (INDEPENDENT_AMBULATORY_CARE_PROVIDER_SITE_OTHER): Payer: 59 | Admitting: Internal Medicine

## 2017-07-11 ENCOUNTER — Encounter: Payer: Self-pay | Admitting: Internal Medicine

## 2017-07-11 VITALS — BP 152/86 | HR 80 | Ht 70.25 in | Wt 253.6 lb

## 2017-07-11 DIAGNOSIS — Z794 Long term (current) use of insulin: Secondary | ICD-10-CM

## 2017-07-11 DIAGNOSIS — E785 Hyperlipidemia, unspecified: Secondary | ICD-10-CM

## 2017-07-11 DIAGNOSIS — E1159 Type 2 diabetes mellitus with other circulatory complications: Secondary | ICD-10-CM | POA: Diagnosis not present

## 2017-07-11 NOTE — Progress Notes (Signed)
Patient ID: Zachary Warner, male   DOB: 12/23/1955, 62 y.o.   MRN: [id number]  HPI: Zachary Warner is a 62 y.o.-year-old male, returning for f/u for DM2, dx in ~1995, insulin-dependent since ~2005, uncontrolled, with complications (CAD - s/p AMI, DR). Last visit 4 mo ago.  Last hemoglobin A1c was: 03/08/2017: HbA1c calculated from the fructosamine is 6.5%. 11/06/2016: HbA1c calculated from the fructosamine is slightly higher, at 6.6% 07/05/2016: HbA1c calculated from the fructosamine is 6.2% 04/05/2016: HbA1c calculated from fructosamine is excellent, at 6% 01/04/2016: HbA1c calculated from fructosamine is  6.55%. 11/10/2015: HbA1c calculated from fructosamine is 5.8%. 08/10/2015: HbA1c calculated from fructosamine is 7.05%. 04/09/2015: HbA1c calculated fructosamine (326): 7.1%, which is much more concordant with his sugar log.  Lab Results  Component Value Date   HGBA1C 8.1 03/08/2017   HGBA1C 8.2 (H) 02/04/2016   HGBA1C 8.9 04/09/2015  He rotates the sites.   He is on: - Metformin 1000 mg 2x a day. - Invokana 300 with morning mg daily before breakfast -had for yeast infections since he started, not recently - Basaglar 80 units at bedtime - Humalog: 24 units before breakfast 32-38 units before lunch  42-48 units before dinner  We stopped Januvia 2/2 large doses of mealtime insulin.  Pt was checking his sugars 1x a day now: - am:  130s >> 76, 95-177, 185, 187 >> 100-140s - 2h after b'fast: n/c >> 134, 148 >> n/c - lunch: 130-160 >> n/c >> 96-130 >> n/c - 2h after lunch:n/c >> 119, 198 >> n/c  - dinner: 151-179>> n/c >> 180, 200 (snacks) >> n/c - 2h after dinner: 160-179 >> n/c >> 160-286 >> n/c - bedtime:  185, 190 >> 129, 147-187, 241 >> n/c - nighttime: 64, 75 >> n/c >> 64 (more active the previous day) >> 60 x1 (smaller dinner) Low sugar: 100 >> 95 >> 60; has hypoglycemia awareness at in the 60s. Highest sugar was 190 >> 286 >> 190  He has a ReliOn meter.  Pt's meals  are: - Breakfast: grits or sandwich - Lunch: sandwich - Dinner: chicken or beef or pork + vegetables - Snacks: 1 or 2 snacks  - crackers, peanuts or cookies He walks for exercise..   -No CKD, last BUN/creatinine:  Lab Results  Component Value Date   BUN 20 02/12/2017   CREATININE 0.86 02/12/2017  On lisinopril.  -+ HL; last set of lipids: Lab Results  Component Value Date   CHOL 145 02/12/2017   HDL 34.50 (L) 02/12/2017   LDLCALC 75 02/12/2017   LDLDIRECT 79.5 03/07/2013   TRIG 180.0 (H) 02/12/2017   CHOLHDL 4 02/12/2017  On Lipitor 40 mg every other day and also fish oil 2 capsules twice a day.    -Reviewed latest eye exam: [facility]): Component Date Value  . HM Diabetic Eye Exam  02/08/2017 Retinopathy*    - no numbness and tingling in his feet.  He also has a history of HTN, h/o nephrolithiasis x 3, last in 03/2013, OSA. He had an AMI 07/2014 >> stent. Cardiologist: Dr Sedonia SmallKrosowski.  ROS: Constitutional: no weight gain/no weight loss, no fatigue, no subjective hyperthermia, no subjective hypothermia Eyes: no blurry vision, no xerophthalmia ENT: no sore throat, no nodules palpated in throat, no dysphagia, no odynophagia, no hoarseness Cardiovascular: no CP/no SOB/no palpitations/no leg swelling Respiratory: no cough/no SOB/no wheezing Gastrointestinal: no N/no V/no D/no C/no acid reflux Musculoskeletal: no muscle aches/no joint aches Skin: no rashes,  no hair loss Neurological: no tremors/no numbness/no tingling/no dizziness  I reviewed pt's medications, allergies, PMH, social hx, family hx, and changes were documented in the history of present illness. Otherwise, unchanged from my initial visit note.  PE: BP (!) 152/86   Pulse 80   Ht 5' 10.25" (1.784 m)   Wt 253 lb 9.6 oz (115 kg)   SpO2 95%   BMI 36.13 kg/m  Body mass index is 36.13 kg/m. Wt Readings from Last 3 Encounters:  07/11/17 253 lb 9.6 oz (115 kg)  03/08/17 246 lb 9.6  oz (111.9 kg)  02/12/17 246 lb 8 oz (111.8 kg)   Constitutional: overweight, in NAD Eyes: PERRLA, EOMI, no exophthalmos ENT: moist mucous membranes, no thyromegaly, no cervical lymphadenopathy Cardiovascular: RRR, No MRG Respiratory: CTA B Gastrointestinal: abdomen soft, NT, ND, BS+ Musculoskeletal: no deformities, strength intact in all 4 Skin: moist, warm, no rashes Neurological: no tremor with outstretched hands, DTR normal in all 4  ASSESSMENT: 1. DM2, insulin-dependent, controlled, with complications - CAD, s/p AMi - DR  2. HL  PLAN:  1. Patient with long-standing, fairly well-controlled, diabetes, with latest HbA1c calculated from the fructosamine 6.5%.  - He was doing a great job checking his sugars at different times of the day and keeping a great log >> not now, but will restart - At last visit, sugars were higher due to dietary indiscretions during the holidays.  We increased the Humalog with larger meals, but otherwise did not change his regimen significantly. - weight higher by 7 lbs as he just returned from a cruise - he is checkign sugars sparsely now. Had 1 mild low at night after a light dinner, but OTW, sugars are at or close to goal. Advised to start checking more frequently - no changes needed in his regimen for now. - I suggested to:  Patient Instructions  Please continue:: - Metformin 1000 mg 2x a day. - Jardiance 25 mg daily before breakfast - Basaglar 80 units at bedtime - Humalog: 24 units before breakfast 32-38 units before lunch  42-48 units before dinner   Please stop at the lab.  Please return in 4 months with your sugar log.   - today we will check his fructosamine level - continue checking sugars at different times of the day - check 3x a day, rotating checks - advised for yearly eye exams >> he is UTD - Return to clinic in 4 mo with sugar log    2. HL - Reviewed latest lipid panel  - Continues the statin (Lipitor) every other day and  fish oil twice a day without side effects.  Office Visit on 07/11/2017  Component Date Value Ref Range Status  . Fructosamine 07/11/2017 295* 190 - 270 umol/L Final   HbA1c calculated from the fructosamine is 6.6%.  Carlus Pavlov, MD PhD [facility]

## 2017-07-11 NOTE — Patient Instructions (Addendum)
Please continue: - Metformin 1000 mg 2x a day. - Jardiance 25 mg daily before breakfast - Basaglar 80 units at bedtime - Humalog: 24 units before breakfast 32-38 units before lunch  42-48 units before dinner   Please stop at the lab.  Please return in 4 months with your sugar log.  

## 2017-07-13 LAB — FRUCTOSAMINE: FRUCTOSAMINE: 295 umol/L — AB (ref 190–270)

## 2017-07-16 ENCOUNTER — Other Ambulatory Visit: Payer: Self-pay | Admitting: Internal Medicine

## 2017-07-16 ENCOUNTER — Other Ambulatory Visit: Payer: Self-pay | Admitting: Cardiology

## 2017-07-16 ENCOUNTER — Telehealth: Payer: Self-pay

## 2017-07-16 NOTE — Telephone Encounter (Signed)
Called pt. Gave results. Patient verbalized understanding.  

## 2017-07-16 NOTE — Telephone Encounter (Signed)
-----   Message from Carlus Pavlovristina Gherghe, MD sent at 07/16/2017  8:48 AM EDT ----- C, can you please call pt: HbA1c calculated from the fructosamine is 6.6%.

## 2017-07-17 ENCOUNTER — Other Ambulatory Visit: Payer: Self-pay | Admitting: Internal Medicine

## 2017-07-20 ENCOUNTER — Other Ambulatory Visit: Payer: Self-pay | Admitting: Family Medicine

## 2017-08-13 ENCOUNTER — Ambulatory Visit: Payer: 59 | Admitting: Family Medicine

## 2017-08-13 ENCOUNTER — Encounter: Payer: Self-pay | Admitting: Family Medicine

## 2017-08-13 VITALS — BP 124/70 | HR 71 | Temp 97.8°F | Ht 70.25 in | Wt 252.8 lb

## 2017-08-13 DIAGNOSIS — I10 Essential (primary) hypertension: Secondary | ICD-10-CM

## 2017-08-13 DIAGNOSIS — I2510 Atherosclerotic heart disease of native coronary artery without angina pectoris: Secondary | ICD-10-CM | POA: Diagnosis not present

## 2017-08-13 DIAGNOSIS — E785 Hyperlipidemia, unspecified: Secondary | ICD-10-CM | POA: Diagnosis not present

## 2017-08-13 NOTE — Assessment & Plan Note (Signed)
S:  continues to follow with Dr. Bing Matter in [place] who is on care everwhere. He remains on aspirin, statin, coreg.  Last Cath in 2016- DES noted )M1, moderate disease in LAD- with medical treatment. Asymptomatic other than mild SOB with hills at high altitude when playing golf this weekend- no issues at current altitude A/P: continue current rx and cardiology follow up

## 2017-08-13 NOTE — Assessment & Plan Note (Signed)
BMI >35 with HTN and HLD. Encouraged need for healthy eating, regular exercise, weight loss.

## 2017-08-13 NOTE — Progress Notes (Signed)
Subjective:  Zachary Warner is a 62 y.o. year old very pleasant male patient who presents for/with See problem oriented charting ROS-  No chest pain or shortness of breath (other than at high altitude playing golf yesterday- but was very mild and none today).  No headache or blurry vision.   Past Medical History-  Patient Active Problem List   Diagnosis Date Noted  . CAD (coronary artery disease) 02/15/2015    Priority: High  . Type 2 diabetes mellitus with circulatory disorder, with long-term current use of insulin (HCC) 02/04/2006    Priority: High  . Hyperlipidemia 02/04/2006    Priority: Medium  . Essential hypertension 02/04/2006    Priority: Medium  . Morbid obesity (HCC) 08/13/2017    Priority: Low  . Nephrolithiasis     Priority: Low  . 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 RECTAL FISSURE 06/05/2006    Priority: Low  . Diabetic retinopathy (HCC) 02/12/2017    Medications- reviewed and updated Current Outpatient Medications  Medication Sig Dispense Refill  . aspirin 81 MG tablet Take 81 mg by mouth daily.     Marland Kitchen atorvastatin (LIPITOR) 40 MG tablet TAKE ONE TABLET BY MOUTH EVERY OTHER DAY 90 tablet 1  . carvedilol (COREG) 3.125 MG tablet TAKE ONE TABLET BY MOUTH TWICE DAILY 60 tablet 2  . EQ FIBER SUPPLEMENT PO Take by mouth.    . fluticasone (FLONASE) 50 MCG/ACT nasal spray Place into both nostrils daily as needed for allergies or rhinitis.    Marland Kitchen glucose blood test strip 1 each by Other route as needed. Use as instructed     . Insulin Glargine (BASAGLAR KWIKPEN) 100 UNIT/ML SOPN Inject 80 units into the skin daily at 10 pm. 30 mL 2  . insulin lispro (HUMALOG KWIKPEN) 100 UNIT/ML KiwkPen INJECT 28-48 UNITS SUBQUE THREE TIMES DAILY 30 mL 0  . Insulin Pen Needle (CLICKFINE PEN NEEDLES) 32G X 4 MM MISC Use 3x a day 200 each 11  . JARDIANCE 25 MG TABS tablet TAKE ONE TABLET BY MOUTH EVERY DAY 30 tablet 0  . lisinopril (PRINIVIL,ZESTRIL) 40 MG tablet TAKE ONE TABLET BY  MOUTH ONCE DAILY 90 tablet 1  . metFORMIN (GLUCOPHAGE) 1000 MG tablet TAKE 1 TABLET BY MOUTH TWICE DAILY WITH MEALS. 180 tablet 1  . Omega-3 Fatty Acids (FISH OIL) 1000 MG CAPS Take 4 capsules by mouth daily.     No current facility-administered medications for this visit.     Objective: BP 124/70   Pulse 71   Temp 97.8 F (36.6 C) (Oral)   Ht 5' 10.25" (1.784 m)   Wt 252 lb 12.8 oz (114.7 kg)   SpO2 96%   BMI 36.02 kg/m  Gen: NAD, resting comfortably CV: RRR no murmurs rubs or gallops Lungs: CTAB no crackles, wheeze, rhonchi Abdomen: soft/nontender/nondistended/normal bowel sounds. obese Ext: no edema Skin: warm, dry Neuro: grossly normal, moves all extremities  Assessment/Plan:  HYpertension S: controlled on  coreg 3.125mg  BID, lisinopril  BP Readings from Last 3 Encounters:  08/13/17 124/70  07/11/17 (!) 152/86  03/08/17 120/60  A/P: We discussed blood pressure goal of <140/90. Continue current meds  Hyperlipidemia S: reasobaly controlled on atorvastatin  every other day with LDL under 80 in November. wweight up 5 lbs- he really enjoys the outdoors and weather has been bad- he is hoping this clears up so he can be more active and trip off 5-10 lbs by physical Lab Results  Component Value  Date   CHOL 145 02/12/2017   HDL 34.50 (L) 02/12/2017   LDLCALC 75 02/12/2017   LDLDIRECT 79.5 03/07/2013   TRIG 180.0 (H) 02/12/2017   CHOLHDL 4 02/12/2017   A/P: would love LDL to be lower but gets myalgias on higher doses  CAD (coronary artery disease) S:  continues to follow with Dr. Bing Matter in [place] who is on care everwhere. He remains on aspirin, statin, coreg.  Last Cath in 2016- DES noted )M1, moderate disease in LAD- with medical treatment. Asymptomatic other than mild SOB with hills at high altitude when playing golf this weekend- no issues at current altitude A/P: continue current rx and cardiology follow up  Morbid obesity (HCC) BMI >35 with HTN and HLD.  Encouraged need for healthy eating, regular exercise, weight loss.   See avs  Future Appointments  Date Time Provider Department Center  11/28/2017  8:00 AM Carlus Pavlov, MD [facility] None  02/18/2018  8:45 AM Shelva Majestic, MD [facility]   Return in about 6 months (around 02/13/2018) for physical.  Return precautions advised.  [name], MD

## 2017-08-13 NOTE — Patient Instructions (Signed)
No changes today in medications  I like that you have been getting more active. Would love to see you trim 5-10 lbs off by physical at least

## 2017-08-14 ENCOUNTER — Encounter: Payer: Self-pay | Admitting: Family Medicine

## 2017-08-14 LAB — HM DIABETES EYE EXAM

## 2017-08-16 ENCOUNTER — Other Ambulatory Visit: Payer: Self-pay | Admitting: Internal Medicine

## 2017-09-08 ENCOUNTER — Other Ambulatory Visit: Payer: Self-pay | Admitting: Internal Medicine

## 2017-09-14 ENCOUNTER — Other Ambulatory Visit: Payer: Self-pay | Admitting: Internal Medicine

## 2017-10-16 ENCOUNTER — Other Ambulatory Visit: Payer: Self-pay | Admitting: Family Medicine

## 2017-10-16 ENCOUNTER — Other Ambulatory Visit: Payer: Self-pay | Admitting: Internal Medicine

## 2017-11-15 ENCOUNTER — Other Ambulatory Visit: Payer: Self-pay | Admitting: Internal Medicine

## 2017-11-28 ENCOUNTER — Ambulatory Visit: Payer: 59 | Admitting: Internal Medicine

## 2017-11-28 ENCOUNTER — Encounter: Payer: Self-pay | Admitting: Internal Medicine

## 2017-11-28 VITALS — BP 130/82 | HR 76 | Temp 97.8°F | Resp 16 | Ht 70.0 in | Wt 243.0 lb

## 2017-11-28 DIAGNOSIS — Z23 Encounter for immunization: Secondary | ICD-10-CM | POA: Diagnosis not present

## 2017-11-28 DIAGNOSIS — Z794 Long term (current) use of insulin: Secondary | ICD-10-CM

## 2017-11-28 DIAGNOSIS — E785 Hyperlipidemia, unspecified: Secondary | ICD-10-CM | POA: Diagnosis not present

## 2017-11-28 DIAGNOSIS — E1159 Type 2 diabetes mellitus with other circulatory complications: Secondary | ICD-10-CM

## 2017-11-28 NOTE — Progress Notes (Addendum)
Patient ID: NATHANEL MUBARAK, male   DOB: March 12, 1956, 62 y.o.   MRN: [id number]  HPI: ADANTE HUSSER is a 62 y.o.-year-old male, returning for f/u for DM2, dx in ~1995, insulin-dependent since ~2005, uncontrolled, with complications (CAD - s/p AMI, DR). Last visit 4 months ago.  His wife had an AMI and then a stent >> pt was very stressed.  She is now feeling better.  Last hemoglobin A1c was: 07/11/2017: HbA1c calculated from the fructosamine is 6.6%. 03/08/2017: HbA1c calculated from the fructosamine is 6.5%. 11/06/2016: HbA1c calculated from the fructosamine is slightly higher, at 6.6% 07/05/2016: HbA1c calculated from the fructosamine is 6.2% 04/05/2016: HbA1c calculated from fructosamine is excellent, at 6% 01/04/2016: HbA1c calculated from fructosamine is  6.55%. 11/10/2015: HbA1c calculated from fructosamine is 5.8%. 08/10/2015: HbA1c calculated from fructosamine is 7.05%. 04/09/2015: HbA1c calculated fructosamine (326): 7.1%, which is much more concordant with his sugar log.  Lab Results  Component Value Date   HGBA1C 8.1 03/08/2017   HGBA1C 8.2 (H) 02/04/2016   HGBA1C 8.9 04/09/2015  He rotates the sites.   He is on: - Metformin 1000 mg 2x a day. - Jardiance 25 mg daily before breakfast - Basaglar 80 units at bedtime - Humalog: 24 units before breakfast 32-38 units before lunch  42-48 units before dinner  We stopped Januvia 2/2 large doses of mealtime insulin.  Pt was checking his sugars seldom, only when he feels poorly: - am:  76, 95-177, 185, 187 >> 100-140s >> 80-130 - 2h after b'fast: n/c >> 134, 148 >> n/c - lunch: 130-160 >> n/c >> 96-130 >> n/c - 2h after lunch:n/c >> 119, 198 >> n/c  - dinner:  n/c >> 180, 200 (snacks) >> n/c - 2h after dinner: 160-179 >> n/c >> 160-286 >> n/c >> highest 212 - bedtime:  185, 190 >> 129, 147-187, 241 >> n/c - nighttime: 64, 75 >> n/c >> 64 >> 60 x1 >> 45 (see below) Low sugar: 60 >> 45 (took Humalog instead of Basaglar); has  hypoglycemia awareness in the 60s. Highest sugar was 190 >> 212  He has a ReliOn meter.  Pt's meals are: - Breakfast: grits or sandwich - Lunch: sandwich - Dinner: chicken or beef or pork + vegetables - Snacks: 1 or 2 snacks  - crackers, peanuts or cookies He walks for exercise.  -No CKD, last BUN/creatinine:  Lab Results  Component Value Date   BUN 20 02/12/2017   CREATININE 0.86 02/12/2017  On lisinopril.  -+ HL; last set of lipids: Lab Results  Component Value Date   CHOL 145 02/12/2017   HDL 34.50 (L) 02/12/2017   LDLCALC 75 02/12/2017   LDLDIRECT 79.5 03/07/2013   TRIG 180.0 (H) 02/12/2017   CHOLHDL 4 02/12/2017  On Lipitor 40 mg every other day and also fish oil 2 capsules twice a day  -Reviewed latest eye exam report:Dr Ambrose Mantle [facility]): 07/2017: + DR -He denies numbness and tingling in his feet.  He also has a history of HTN, h/o nephrolithiasis x 3, last in 03/2013, OSA. He had an AMI 07/2014 >> stent. Cardiologist: Dr Sedonia Small.  ROS: Constitutional: no weight gain/no weight loss, no fatigue, no subjective hyperthermia, no subjective hypothermia Eyes: no blurry vision, no xerophthalmia ENT: no sore throat, no nodules palpated in throat, no dysphagia, no odynophagia, no hoarseness Cardiovascular: no CP/no SOB/no palpitations/no leg swelling Respiratory: no cough/no SOB/no wheezing Gastrointestinal: no N/no V/no D/no C/no acid reflux Musculoskeletal: no muscle aches/no joint aches  Skin: no rashes, no hair loss Neurological: no tremors/no numbness/no tingling/no dizziness  I reviewed pt's medications, allergies, PMH, social hx, family hx, and changes were documented in the history of present illness. Otherwise, unchanged from my initial visit note.  Past Medical History:  Diagnosis Date  . Allergy   . Diabetes mellitus   . Hyperlipidemia   . Hypertension   . Myocardial infarction [facility])    May 2016  . Nephrolithiasis    3x 2007-2015, passed  on his own  . Sleep apnea    wears CPAP   Past Surgical History:  Procedure Laterality Date  . CARDIAC CATHETERIZATION     stent May 2016  . COLONOSCOPY    . heart stent     May 2016  . HERNIA REPAIR     Social History   Socioeconomic History  . Marital status: Married    Spouse name: Not on file  . Number of children: Not on file  . Years of education: Not on file  . Highest education level: Not on file  Occupational History  . Not on file  Social Needs  . Financial resource strain: Not on file  . Food insecurity:    Worry: Not on file    Inability: Not on file  . Transportation needs:    Medical: Not on file    Non-medical: Not on file  Tobacco Use  . Smoking status: Never Smoker  . Smokeless tobacco: Never Used  Substance and Sexual Activity  . Alcohol use: No  . Drug use: No  . Sexual activity: Never  Lifestyle  . Physical activity:    Days per week: Not on file    Minutes per session: Not on file  . Stress: Not on file  Relationships  . Social connections:    Talks on phone: Not on file    Gets together: Not on file    Attends religious service: Not on file    Active member of club or organization: Not on file    Attends meetings of clubs or organizations: Not on file    Relationship status: Not on file  . Intimate partner violence:    Fear of current or ex partner: Not on file    Emotionally abused: Not on file    Physically abused: Not on file    Forced sexual activity: Not on file  Other Topics Concern  . Not on file  Social History Narrative   Married. 2 children son and daughter. Granddaughter 12/02/2014 born and grandson 7/17//2018.       Works Administrator, sports: golfing (low to mid 54s)   Current Outpatient Medications on File Prior to Visit  Medication Sig Dispense Refill  . aspirin 81 MG tablet Take 81 mg by mouth daily.     Marland Kitchen atorvastatin (LIPITOR) 40 MG tablet TAKE ONE TABLET BY MOUTH EVERY OTHER DAY 90  tablet 1  . carvedilol (COREG) 3.125 MG tablet TAKE ONE TABLET BY MOUTH TWICE DAILY 60 tablet 2  . EQ FIBER SUPPLEMENT PO Take by mouth.    . fluconazole (DIFLUCAN) 150 MG tablet TAKE ONE TABLET BY MOUTH FOR 1 DOSE 1 tablet 1  . fluticasone (FLONASE) 50 MCG/ACT nasal spray Place into both nostrils daily as needed for allergies or rhinitis.    Marland Kitchen glucose blood test strip 1 each by Other route as needed. Use as instructed     . Insulin Glargine (BASAGLAR KWIKPEN) 100  UNIT/ML SOPN Inject 80 units into the skin daily at 10 pm. 30 mL 2  . insulin lispro (HUMALOG KWIKPEN) 100 UNIT/ML KiwkPen INJECT 28-48 UNITS SUBQUE THREE TIMES DAILY 30 mL 0  . Insulin Pen Needle (CLICKFINE PEN NEEDLES) 32G X 4 MM MISC Use 3x a day 200 each 11  . JARDIANCE 25 MG TABS tablet TAKE ONE TABLET BY MOUTH EVERY DAY 90 tablet 0  . lisinopril (PRINIVIL,ZESTRIL) 40 MG tablet TAKE ONE TABLET BY MOUTH ONCE DAILY 90 tablet 1  . metFORMIN (GLUCOPHAGE) 1000 MG tablet TAKE 1 TABLET BY MOUTH TWICE DAILY WITH MEALS. 180 tablet 1  . Omega-3 Fatty Acids (FISH OIL) 1000 MG CAPS Take 4 capsules by mouth daily.     No current facility-administered medications on file prior to visit.    Allergies  Allergen Reactions  . Invokana [Canagliflozin] Other (See Comments)    Yeast infection   Family History  Problem Relation Age of Onset  . Coronary artery disease Father   . Liver disease Father        from heart meds per pt  . Diabetes Sister        grandmother  . Kidney disease Sister        dialysis from dm  . Diabetes Sister   . Other Sister        flash pulmonary edema led to death  . Colon cancer Neg Hx   . Esophageal cancer Neg Hx   . Rectal cancer Neg Hx   . Stomach cancer Neg Hx     PE: BP 130/82 (BP Location: Right Arm, Patient Position: Sitting, Cuff Size: Small)   Pulse 76   Temp 97.8 F (36.6 C) (Oral)   Resp 16   Ht 5\' 10"  (1.778 m)   Wt 243 lb (110.2 kg)   SpO2 97%   BMI 34.87 kg/m  Body mass index is 34.87  kg/m. Wt Readings from Last 3 Encounters:  11/28/17 243 lb (110.2 kg)  08/13/17 252 lb 12.8 oz (114.7 kg)  07/11/17 253 lb 9.6 oz (115 kg)   Constitutional: overweight, in NAD Eyes: PERRLA, EOMI, no exophthalmos ENT: moist mucous membranes, no thyromegaly, no cervical lymphadenopathy Cardiovascular: RRR, No MRG Respiratory: CTA B Gastrointestinal: abdomen soft, NT, ND, BS+ Musculoskeletal: no deformities, strength intact in all 4 Skin: moist, warm, no rashes Neurological: no tremor with outstretched hands, DTR normal in all 4  ASSESSMENT: 1. DM2, insulin-dependent, controlled, with complications - CAD, s/p AMi - DR  2. HL  PLAN:  1. Patient with long-standing, fairly well-controlled diabetes, with latest HbA1c calculated from the fructosamine 6.6%.  At last visit, he just returned from a cruise where he gained 7 pounds.  He also was not checking his sugars consistently.  He only had one mild low after a light dinner, but otherwise sugars were at or close to goal.  I advised him to check more frequently, but we did not change his regimen at that time. - At this visit, he is still not checking sugars consistently, which I again advised him to try to do.  However, whenever he checks, he does not feel that they are different than before.  He had one incidence in which his sugars overnight dropped to 45 but he realized that he took Humalog instead of Basaglar the night before!  We discussed about putting the Basaglar pen on the nightstand and leaving the Humalog in the kitchen. - We will not change his regimen at this time -  I suggested to:  Patient Instructions  Please continue: - Metformin 1000 mg 2x a day. - Jardiance 25 mg daily before breakfast - Basaglar 80 units at bedtime - Humalog: 24 units before breakfast 32-38 units before lunch  42-48 units before dinner   Please stop at the lab.  Please return in 4 months with your sugar log.   - today we will check a  fructosamine - continue checking sugars at different times of the day - check 3x a day, rotating checks - advised for yearly eye exams >> he is UTD - given flu shot - Return to clinic in a mo with sugar log     2. HL - Reviewed latest lipid panel from 01/2017: LDL slightly better, at goal, triglycerides high and HDL slightly low Lab Results  Component Value Date   CHOL 145 02/12/2017   HDL 34.50 (L) 02/12/2017   LDLCALC 75 02/12/2017   LDLDIRECT 79.5 03/07/2013   TRIG 180.0 (H) 02/12/2017   CHOLHDL 4 02/12/2017  - Continues Lipitor every other day and fish oil twice a day without side effects.  Office Visit on 11/28/2017  Component Date Value Ref Range Status  . Fructosamine 11/28/2017 285* 190 - 270 umol/L Final    Hba1c calculated from fructosamine is better: 6.45%  Carlus Pavlov, MD PhD [facility]

## 2017-11-28 NOTE — Patient Instructions (Signed)
Please continue: - Metformin 1000 mg 2x a day. - Jardiance 25 mg daily before breakfast - Basaglar 80 units at bedtime - Humalog: 24 units before breakfast 32-38 units before lunch  42-48 units before dinner   Please stop at the lab.  Please return in 4 months with your sugar log.

## 2017-11-29 LAB — FRUCTOSAMINE: Fructosamine: 285 umol/L — ABNORMAL HIGH (ref 190–270)

## 2017-12-13 ENCOUNTER — Other Ambulatory Visit: Payer: Self-pay | Admitting: Family Medicine

## 2017-12-13 ENCOUNTER — Other Ambulatory Visit: Payer: Self-pay | Admitting: Internal Medicine

## 2018-01-14 ENCOUNTER — Other Ambulatory Visit: Payer: Self-pay | Admitting: Family Medicine

## 2018-01-14 ENCOUNTER — Other Ambulatory Visit: Payer: Self-pay | Admitting: Internal Medicine

## 2018-02-11 ENCOUNTER — Other Ambulatory Visit: Payer: Self-pay | Admitting: Internal Medicine

## 2018-02-18 ENCOUNTER — Ambulatory Visit: Payer: 59 | Admitting: Family Medicine

## 2018-02-18 ENCOUNTER — Encounter: Payer: Self-pay | Admitting: Family Medicine

## 2018-02-18 VITALS — BP 110/60 | HR 74 | Temp 98.1°F | Ht 70.0 in | Wt 252.0 lb

## 2018-02-18 DIAGNOSIS — Z0000 Encounter for general adult medical examination without abnormal findings: Secondary | ICD-10-CM | POA: Diagnosis not present

## 2018-02-18 DIAGNOSIS — E113299 Type 2 diabetes mellitus with mild nonproliferative diabetic retinopathy without macular edema, unspecified eye: Secondary | ICD-10-CM

## 2018-02-18 DIAGNOSIS — E785 Hyperlipidemia, unspecified: Secondary | ICD-10-CM

## 2018-02-18 DIAGNOSIS — Z6836 Body mass index (BMI) 36.0-36.9, adult: Secondary | ICD-10-CM

## 2018-02-18 DIAGNOSIS — I2510 Atherosclerotic heart disease of native coronary artery without angina pectoris: Secondary | ICD-10-CM

## 2018-02-18 DIAGNOSIS — Z125 Encounter for screening for malignant neoplasm of prostate: Secondary | ICD-10-CM

## 2018-02-18 DIAGNOSIS — Z79899 Other long term (current) drug therapy: Secondary | ICD-10-CM

## 2018-02-18 DIAGNOSIS — I10 Essential (primary) hypertension: Secondary | ICD-10-CM

## 2018-02-18 DIAGNOSIS — E1159 Type 2 diabetes mellitus with other circulatory complications: Secondary | ICD-10-CM | POA: Diagnosis not present

## 2018-02-18 DIAGNOSIS — Z794 Long term (current) use of insulin: Secondary | ICD-10-CM | POA: Diagnosis not present

## 2018-02-18 LAB — COMPREHENSIVE METABOLIC PANEL
ALBUMIN: 4.4 g/dL (ref 3.5–5.2)
ALT: 18 U/L (ref 0–53)
AST: 15 U/L (ref 0–37)
Alkaline Phosphatase: 49 U/L (ref 39–117)
BUN: 20 mg/dL (ref 6–23)
CALCIUM: 10 mg/dL (ref 8.4–10.5)
CHLORIDE: 103 meq/L (ref 96–112)
CO2: 27 mEq/L (ref 19–32)
Creatinine, Ser: 1.03 mg/dL (ref 0.40–1.50)
GFR: 77.64 mL/min (ref >60.00)
Glucose, Bld: 168 mg/dL — ABNORMAL HIGH (ref 70–99)
POTASSIUM: 4.6 meq/L (ref 3.5–5.1)
SODIUM: 140 meq/L (ref 135–145)
Total Bilirubin: 0.8 mg/dL (ref 0.2–1.2)
Total Protein: 7 g/dL (ref 6.0–8.3)

## 2018-02-18 LAB — POCT GLYCOSYLATED HEMOGLOBIN (HGB A1C): HEMOGLOBIN A1C: 8.1 % — AB (ref 4.0–5.6)

## 2018-02-18 LAB — LIPID PANEL
Cholesterol: 138 mg/dL (ref 0–200)
HDL: 32 mg/dL — AB (ref >39.00)
NonHDL: 106.15
Total CHOL/HDL Ratio: 4
Triglycerides: 205 mg/dL — ABNORMAL HIGH (ref 0.0–149.0)
VLDL: 41 mg/dL — AB (ref 0.0–40.0)

## 2018-02-18 LAB — PSA: PSA: 0.62 ng/mL (ref 0.10–4.00)

## 2018-02-18 LAB — CBC
HCT: 45.4 % (ref 39.0–52.0)
HEMOGLOBIN: 15.1 g/dL (ref 13.0–17.0)
MCHC: 33.2 g/dL (ref 30.0–36.0)
MCV: 85.9 fl (ref 78.0–100.0)
Platelets: 185 10*3/uL (ref 150.0–400.0)
RBC: 5.28 Mil/uL (ref 4.22–5.81)
RDW: 14.2 % (ref 11.5–15.5)
WBC: 5.5 10*3/uL (ref 4.0–10.5)

## 2018-02-18 LAB — LDL CHOLESTEROL, DIRECT: Direct LDL: 81 mg/dL

## 2018-02-18 LAB — VITAMIN B12: VITAMIN B 12: 290 pg/mL (ref 211–911)

## 2018-02-18 NOTE — Patient Instructions (Addendum)
Please stop by lab before you go   We now have shingrix which is the new shingles vaccine. It is a 2 shot series and must separate 2-5 months. See if your insurance covers and we can give you it at 6 month follow up.   Would like to see you drop 5 to 10 pounds by next visit.  Consider weight watchers or calorie counting with applications like my fitness pal or calorie [employer]- see what your baseline is (on a non thanksgiving week) then consider cutting down by 100-200 calories per day for a few weeks and if you dont see weight loss- cut down again

## 2018-02-18 NOTE — Progress Notes (Signed)
Phone: [phone]  Subjective:  Patient presents today for their annual physical. Chief complaint-noted.   See problem oriented charting- ROS- full  review of systems was completed and negative except for: some congestion and sneezing- a lot of sick contacts but has been able to work. No sinus pressure. No shortness of breath. No fever.   The following were reviewed and entered/updated in epic: Past Medical History:  Diagnosis Date  . Allergy   . Diabetes mellitus   . Hyperlipidemia   . Hypertension   . Myocardial infarction [facility])    May 2016  . Nephrolithiasis    3x 2007-2015, passed on his own  . Sleep apnea    wears CPAP   Patient Active Problem List   Diagnosis Date Noted  . CAD (coronary artery disease) 02/15/2015    Priority: High  . Type 2 diabetes mellitus with circulatory disorder, with long-term current use of insulin (HCC) 02/04/2006    Priority: High  . Hyperlipidemia 02/04/2006    Priority: Medium  . Essential hypertension 02/04/2006    Priority: Medium  . Morbid obesity (HCC) 08/13/2017    Priority: Low  . Nephrolithiasis     Priority: Low  . 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 RECTAL FISSURE 06/05/2006    Priority: Low  . Diabetic retinopathy (HCC) 02/12/2017   Past Surgical History:  Procedure Laterality Date  . CARDIAC CATHETERIZATION     stent May 2016  . COLONOSCOPY    . heart stent     May 2016  . HERNIA REPAIR      Family History  Problem Relation Age of Onset  . Coronary artery disease Father   . Liver disease Father        from heart meds per pt  . Diabetes Sister        grandmother  . Kidney disease Sister        dialysis from dm  . Diabetes Sister   . Other Sister        flash pulmonary edema led to death  . Colon cancer Neg Hx   . Esophageal cancer Neg Hx   . Rectal cancer Neg Hx   . Stomach cancer Neg Hx     Medications- reviewed and updated Current Outpatient Medications  Medication Sig Dispense Refill    . aspirin 81 MG tablet Take 81 mg by mouth daily.     Marland Kitchen atorvastatin (LIPITOR) 40 MG tablet TAKE ONE TABLET BY MOUTH EVERY OTHER DAY 90 tablet 1  . carvedilol (COREG) 3.125 MG tablet TAKE ONE TABLET BY MOUTH TWICE DAILY 60 tablet 5  . EQ FIBER SUPPLEMENT PO Take by mouth.    . fluconazole (DIFLUCAN) 150 MG tablet TAKE ONE TABLET BY MOUTH FOR 1 DOSE 1 tablet 1  . fluticasone (FLONASE) 50 MCG/ACT nasal spray Place into both nostrils daily as needed for allergies or rhinitis.    Marland Kitchen glucose blood test strip 1 each by Other route as needed. Use as instructed     . Insulin Glargine (BASAGLAR KWIKPEN) 100 UNIT/ML SOPN Inject 80 units into the skin daily at 10 pm. 30 mL 2  . insulin lispro (HUMALOG KWIKPEN) 100 UNIT/ML KwikPen INJECT 28-48 UNITS SUBQUE THREE TIMES DAILY 30 mL 1  . Insulin Pen Needle (CLICKFINE PEN NEEDLES) 32G X 4 MM MISC Use 3x a day 200 each 11  . JARDIANCE 25 MG TABS tablet TAKE ONE TABLET BY MOUTH EVERY DAY 90 tablet 0  .  lisinopril (PRINIVIL,ZESTRIL) 40 MG tablet TAKE ONE TABLET BY MOUTH ONCE DAILY 90 tablet 1  . metFORMIN (GLUCOPHAGE) 1000 MG tablet TAKE 1 TABLET BY MOUTH TWICE DAILY WITH MEALS. 180 tablet 1  . Omega-3 Fatty Acids (FISH OIL) 1000 MG CAPS Take 4 capsules by mouth daily.     No current facility-administered medications for this visit.     Allergies-reviewed and updated Allergies  Allergen Reactions  . Invokana [Canagliflozin] Other (See Comments)    Yeast infection    Social History   Social History Narrative   Married. 2 children son and daughter. Granddaughter 12/02/2014 born and grandson 7/17//2018.       Works Administrator, sports: golfing (low to mid 80s)    Objective: BP 110/60 (BP Location: Left Arm, Patient Position: Sitting, Cuff Size: Large)   Pulse 74   Temp 98.1 F (36.7 C) (Oral)   Ht 5\' 10"  (1.778 m)   Wt 252 lb (114.3 kg)   SpO2 96%   BMI 36.16 kg/m  Gen: NAD, resting comfortably HEENT: Mucous  membranes are moist. Oropharynx normal Neck: no thyromegaly CV: RRR no murmurs rubs or gallops Lungs: CTAB no crackles, wheeze, rhonchi Abdomen: soft/nontender/nondistended/normal bowel sounds. No rebound or guarding.  Obese with periumbilical hernia noted Ext: no edema Skin: warm, dry Neuro: grossly normal, moves all extremities, PERRLA Diabetic Foot Exam - Simple   Simple Foot Form Diabetic Foot exam was performed with the following findings:  Yes 02/18/2018  9:14 AM  Visual Inspection No deformities, no ulcerations, no other skin breakdown bilaterally:  Yes Sensation Testing Intact to touch and monofilament testing bilaterally:  Yes Pulse Check Posterior Tibialis and Dorsalis pulse intact bilaterally:  Yes Comments    Assessment/Plan:  62 y.o. male presenting for annual physical.  Health Maintenance counseling: 1. Anticipatory guidance: Patient counseled regarding regular dental exams -q6 months advised- needs to get this updated, eye exams - going to have to switch from Dr. Marina Gravel due to insurance- yearly,  avoiding smoking and second hand smoke- tries to avoid being around mom when smoking, limiting alcohol to 2 beverages per day - doesn't drink at all.   2. Risk factor reduction:  Advised patient of need for regular exercise and diet rich and fruits and vegetables to reduce risk of heart attack and stroke.  Technically has morbid obesity due to BMI over 35 with hypertension, hyperlipidemia, diabetes.  Exercise- 6-8k steps on normal day- some days up to 12k- trying ot bump this up. Diet-weight up 6 lbs from last year- he states 248-254 on home scales but cant get much below this- discussed possible calorie counting.  Wt Readings from Last 3 Encounters:  02/18/18 252 lb (114.3 kg)  11/28/17 243 lb (110.2 kg)  08/13/17 252 lb 12.8 oz (114.7 kg)  3. Immunizations/screenings/ancillary studies-already had flu shot. Discussed Shingrix Immunization History  Administered Date(s)  Administered  . Influenza Split 04/17/2011  . Influenza Whole 01/02/2007, 01/07/2009, 02/04/2010, 01/06/2012  . Influenza,inj,Quad PF,6+ Mos 03/14/2013, 02/03/2014, 01/06/2015, 11/28/2017  . Influenza-Unspecified 12/25/2012, 01/28/2016, 01/13/2017  . Pneumococcal Conjugate-13 07/15/2013  . Pneumococcal Polysaccharide-23 05/11/2008, 08/19/2014  . Tdap 07/15/2013  4. Prostate cancer screening- update PSA trend.  We agreed to rectal exam in future if concerning PSA trend.  Low risk rectal exam last year Lab Results  Component Value Date   PSA 0.70 02/12/2017   PSA 0.42 02/04/2016   5. Colon cancer screening - 04/28/2016 with 10-year follow-up 6. Skin  cancer screening- advised regular sunscreen use (doesn't do well). Denies worrisome, changing, or new skin lesions.  7. Never smoker  Status of chronic or acute concerns   Hypertension-controlled on Coreg 3.125 mg twice daily, lisinopril 40 mg BP Readings from Last 3 Encounters:  02/18/18 110/60  11/28/17 130/82  08/13/17 124/70    Hyperlipidemia-controlled on atorvastatin 40 mg every other day with LDL last year under 80-we were hoping to work on diet and exercise to get the number below 70 as he gets myalgias on higher doses.  Update lipids today  Lab Results  Component Value Date   CHOL 145 02/12/2017   HDL 34.50 (L) 02/12/2017   LDLCALC 75 02/12/2017   LDLDIRECT 79.5 03/07/2013   TRIG 180.0 (H) 02/12/2017   CHOLHDL 4 02/12/2017     Coronary artery disease- [place]- now with Dr. Molly Madurolevenger- may be back with Dr. Mauri BrooklynKraswoski next year it sounds like (may be non compete issue).  Accessible through care everywhere.  Patient remains on aspirin, statin, Coreg.  Asymptomatic other than mild shortness of breath with hills at high-altitude.  Morbid obesity-BMI over 35 with hypertension, hyperlipidemia, diabetes, coronary artery disease- continue efforts for weight loss- he unfortunately doesn't have confidence that  he can make significant change   Diabetes- managed by endocrine with last fructosamine related calculation of 6.6%- excellent control in September.  Insulin dependent. Also on metformin and jardiance. Will check b12 with high risk medication use. Fluconazole as needed for yeast infections -Updated foot exam today  -Updated A1c for metric purposes-unfortunately metrics do not allow substitution for fructosamine values even though these are far more accurate for this patient -Known diabetic retinopathy-follows with ophthalmology.  Has been stable-continue yearly eye exams   Future Appointments  Date Time Provider Department Center  04/09/2018  8:00 AM Carlus PavlovGherghe, Cristina, MD [facility] None   6 month blood pressure check, 1 year physical  Lab/Order associations: Not fasting  Preventative health care - Plan: CBC, Lipid panel, Comprehensive metabolic panel, PSA, Vitamin B12  Type 2 diabetes mellitus with other circulatory complication, with long-term current use of insulin (HCC) - Plan: POCT glycosylated hemoglobin (Hb A1C), CBC, Lipid panel, Comprehensive metabolic panel  Body mass index (BMI) of 36.0-36.9 in adult  Screening for prostate cancer - Plan: PSA  Coronary artery disease involving native coronary artery of native heart without angina pectoris  Hyperlipidemia, unspecified hyperlipidemia type  Essential hypertension  Morbid obesity (HCC)  Mild nonproliferative diabetic retinopathy without macular edema associated with type 2 diabetes mellitus, unspecified laterality (HCC)  High risk medication use - Plan: Vitamin B12  Return precautions advised.  Tana ConchStephen Hunter, MD

## 2018-03-14 ENCOUNTER — Other Ambulatory Visit: Payer: Self-pay | Admitting: Internal Medicine

## 2018-03-29 ENCOUNTER — Other Ambulatory Visit: Payer: Self-pay | Admitting: Internal Medicine

## 2018-03-29 NOTE — Telephone Encounter (Signed)
Done

## 2018-03-29 NOTE — Telephone Encounter (Signed)
OK 

## 2018-03-29 NOTE — Telephone Encounter (Signed)
Please advise on this refill  

## 2018-04-09 ENCOUNTER — Encounter: Payer: Self-pay | Admitting: Internal Medicine

## 2018-04-09 ENCOUNTER — Ambulatory Visit (INDEPENDENT_AMBULATORY_CARE_PROVIDER_SITE_OTHER): Payer: 59 | Admitting: Internal Medicine

## 2018-04-09 VITALS — BP 138/64 | HR 76 | Ht 70.0 in | Wt 254.0 lb

## 2018-04-09 DIAGNOSIS — E785 Hyperlipidemia, unspecified: Secondary | ICD-10-CM | POA: Diagnosis not present

## 2018-04-09 DIAGNOSIS — E1159 Type 2 diabetes mellitus with other circulatory complications: Secondary | ICD-10-CM

## 2018-04-09 DIAGNOSIS — Z794 Long term (current) use of insulin: Secondary | ICD-10-CM

## 2018-04-09 MED ORDER — INSULIN DEGLUDEC 200 UNIT/ML ~~LOC~~ SOPN: 80.0000 [IU] | PEN_INJECTOR | Freq: Every day | SUBCUTANEOUS | 3 refills | Status: DC

## 2018-04-09 NOTE — Patient Instructions (Addendum)
Please continue: - Metformin 1000 mg 2x a day. - Jardiance 25 mg daily before breakfast - Humalog: 24 units before breakfast 32 units before lunch  40-44 units before dinner   Stop Basaglar and start Tresiba U200 80 units daily.  Please stop at the lab.  Please return in 4 months with your sugar log.

## 2018-04-09 NOTE — Progress Notes (Signed)
Patient ID: Zachary Warner, male   DOB: 06-18-1955, 63 y.o.   MRN: [id number]  HPI: Zachary Warner is a 63 y.o.-year-old male, returning for f/u for DM2, dx in ~1995, insulin-dependent since ~2005, uncontrolled, with complications (CAD - s/p AMI, DR). Last visit 4 months ago.  He relaxed his diet over the holidays and sugars increased significantly.  Last hemoglobin A1c was: 11/28/2017:  Hba1c calculated from fructosamine is better: 6.45% 07/11/2017: HbA1c calculated from the fructosamine is 6.6%. 03/08/2017: HbA1c calculated from the fructosamine is 6.5%. 11/06/2016: HbA1c calculated from the fructosamine is slightly higher, at 6.6% 07/05/2016: HbA1c calculated from the fructosamine is 6.2% 04/05/2016: HbA1c calculated from fructosamine is excellent, at 6% 01/04/2016: HbA1c calculated from fructosamine is  6.55%. 11/10/2015: HbA1c calculated from fructosamine is 5.8%. 08/10/2015: HbA1c calculated from fructosamine is 7.05%. 04/09/2015: HbA1c calculated fructosamine (326): 7.1%, which is much more concordant with his sugar log.  Lab Results  Component Value Date   HGBA1C 8.1 (A) 02/18/2018   HGBA1C 8.1 03/08/2017   HGBA1C 8.2 (H) 02/04/2016  He rotates the sites.   He is on: - Metformin 1000 mg 2x a day. - Jardiance 25 mg daily before breakfast - Basaglar 80 units at bedtime - Humalog: 24 units before breakfast 32-38 >> 32 units before lunch  42-48 >> 40-44 units before dinner  We stopped Januvia 2/2 large doses of mealtime insulin.  Pt checks his sugars 0-1x a day: - am:  7 100-140s >> 80-130 >> (after coffee):234-291 - 2h after b'fast: n/c >> 134, 148 >> n/c >> 184-210 - lunch: 96-130 >> n/c >> 166-199, 201, 262 - 2h after lunch: 119, 198 >> n/c >> 73, 122-236 - dinner:  n/c >> 180, 200 (snacks) >> n/c >> 186 - 2h after dinner:  160-286 >> n/c >> highest 212 >> n/c - bedtime:  185, 190 >> 129, 147-187, 241 >> n/c - nighttime: 64 >> 60 x1 >> 45 (see below) >> n/c Low sugar:  60 >> 45 (took Humalog instead of Basaglar) >> 72; has hypoglycemia awareness in the 60s. Highest sugar was 190 >> 212 >> 291.  He has a ReliOn meter.  Pt's meals are: - Breakfast: grits or sandwich - Lunch: sandwich - Dinner: chicken or beef or pork + vegetables - Snacks: 1 or 2 snacks  - crackers, peanuts or cookies  -No CKD, last BUN/creatinine:  Lab Results  Component Value Date   BUN 20 02/18/2018   CREATININE 1.03 02/18/2018  On Lisinopril.  -+ HL; last set of lipids: Lab Results  Component Value Date   CHOL 138 02/18/2018   HDL 32.00 (L) 02/18/2018   LDLCALC 75 02/12/2017   LDLDIRECT 81.0 02/18/2018   TRIG 205.0 (H) 02/18/2018   CHOLHDL 4 02/18/2018  On Lipitor 40 every other day and fish oil 2 capsules twice a day.  -Reviewed latest eye exam report:Dr [facility]): 07/2017: + DR - no numbness and tingling in his feet.  He also has a history of HTN, h/o nephrolithiasis x 3, last in 03/2013, OSA.  He had an MI 07/2014 >> had a stent placed. Cardiologist: Dr Sedonia Small.  Started po B12.  ROS: Constitutional: no weight gain/no weight loss, no fatigue, no subjective hyperthermia, no subjective hypothermia Eyes: no blurry vision, no xerophthalmia ENT: no sore throat, no nodules palpated in throat, no dysphagia, no odynophagia, no hoarseness Cardiovascular: no CP/no SOB/no palpitations/no leg swelling Respiratory: no cough/no SOB/no wheezing Gastrointestinal: no N/no V/no D/no C/no acid  reflux Musculoskeletal: no muscle aches/no joint aches Skin: no rashes, no hair loss Neurological: no tremors/no numbness/no tingling/no dizziness  I reviewed pt's medications, allergies, PMH, social hx, family hx, and changes were documented in the history of present illness. Otherwise, unchanged from my initial visit note.  Past Medical History:  Diagnosis Date  . Allergy   . Diabetes mellitus   . Hyperlipidemia   . Hypertension   . Myocardial infarction [facility])     May 2016  . Nephrolithiasis    3x 2007-2015, passed on his own  . Sleep apnea    wears CPAP   Past Surgical History:  Procedure Laterality Date  . CARDIAC CATHETERIZATION     stent May 2016  . COLONOSCOPY    . heart stent     May 2016  . HERNIA REPAIR     Social History   Socioeconomic History  . Marital status: Married    Spouse name: Not on file  . Number of children: Not on file  . Years of education: Not on file  . Highest education level: Not on file  Occupational History  . Not on file  Social Needs  . Financial resource strain: Not on file  . Food insecurity:    Worry: Not on file    Inability: Not on file  . Transportation needs:    Medical: Not on file    Non-medical: Not on file  Tobacco Use  . Smoking status: Never Smoker  . Smokeless tobacco: Never Used  Substance and Sexual Activity  . Alcohol use: No  . Drug use: No  . Sexual activity: Never  Lifestyle  . Physical activity:    Days per week: Not on file    Minutes per session: Not on file  . Stress: Not on file  Relationships  . Social connections:    Talks on phone: Not on file    Gets together: Not on file    Attends religious service: Not on file    Active member of club or organization: Not on file    Attends meetings of clubs or organizations: Not on file    Relationship status: Not on file  . Intimate partner violence:    Fear of current or ex partner: Not on file    Emotionally abused: Not on file    Physically abused: Not on file    Forced sexual activity: Not on file  Other Topics Concern  . Not on file  Social History Narrative   Married. 2 children son and daughter. Granddaughter 12/02/2014 born and grandson 7/17//2018.       Works Administrator, sports: golfing (low to mid 30s)   Current Outpatient Medications on File Prior to Visit  Medication Sig Dispense Refill  . aspirin 81 MG tablet Take 81 mg by mouth daily.     Marland Kitchen atorvastatin (LIPITOR)  40 MG tablet TAKE ONE TABLET BY MOUTH EVERY OTHER DAY 90 tablet 1  . carvedilol (COREG) 3.125 MG tablet TAKE ONE TABLET BY MOUTH TWICE DAILY 60 tablet 5  . EQ FIBER SUPPLEMENT PO Take by mouth.    . fluconazole (DIFLUCAN) 150 MG tablet TAKE ONE TABLET BY MOUTH FOR 1 DOSE 1 tablet 1  . fluticasone (FLONASE) 50 MCG/ACT nasal spray Place into both nostrils daily as needed for allergies or rhinitis.    Marland Kitchen glucose blood test strip 1 each by Other route as needed. Use as instructed     .  Insulin Glargine (BASAGLAR KWIKPEN) 100 UNIT/ML SOPN Inject 80 units into the skin daily at 10 pm. 30 mL 2  . insulin lispro (HUMALOG KWIKPEN) 100 UNIT/ML KwikPen INJECT 28-48 UNITS SUBQUE THREE TIMES DAILY 30 mL 1  . Insulin Pen Needle (CLICKFINE PEN NEEDLES) 32G X 4 MM MISC Use 3x a day 200 each 11  . JARDIANCE 25 MG TABS tablet TAKE ONE TABLET BY MOUTH EVERY DAY 90 tablet 0  . lisinopril (PRINIVIL,ZESTRIL) 40 MG tablet TAKE ONE TABLET BY MOUTH ONCE DAILY 90 tablet 1  . metFORMIN (GLUCOPHAGE) 1000 MG tablet TAKE 1 TABLET BY MOUTH TWICE DAILY WITH MEALS. 180 tablet 1  . Omega-3 Fatty Acids (FISH OIL) 1000 MG CAPS Take 4 capsules by mouth daily.     No current facility-administered medications on file prior to visit.    Allergies  Allergen Reactions  . Invokana [Canagliflozin] Other (See Comments)    Yeast infection   Family History  Problem Relation Age of Onset  . Coronary artery disease Father   . Liver disease Father        from heart meds per pt  . Diabetes Sister        grandmother  . Kidney disease Sister        dialysis from dm  . Diabetes Sister   . Other Sister        flash pulmonary edema led to death  . Colon cancer Neg Hx   . Esophageal cancer Neg Hx   . Rectal cancer Neg Hx   . Stomach cancer Neg Hx     PE: BP 138/64   Pulse 76   Ht 5\' 10"  (1.778 m)   Wt 254 lb (115.2 kg)   SpO2 98%   BMI 36.45 kg/m  Body mass index is 36.45 kg/m. Wt Readings from Last 3 Encounters:  04/09/18  254 lb (115.2 kg)  02/18/18 252 lb (114.3 kg)  11/28/17 243 lb (110.2 kg)   Constitutional: overweight, in NAD Eyes: PERRLA, EOMI, no exophthalmos ENT: moist mucous membranes, no thyromegaly, no cervical lymphadenopathy Cardiovascular: RRR, No MRG Respiratory: CTA B Gastrointestinal: abdomen soft, NT, ND, BS+ Musculoskeletal: no deformities, strength intact in all 4 Skin: moist, warm, no rashes Neurological: no tremor with outstretched hands, DTR normal in all 4  ASSESSMENT: 1. DM2, insulin-dependent, controlled, with complications - CAD, s/p AMi - DR  2. HL  3.  Obesity  PLAN:  1. Patient with longstanding, previously fairly well-controlled diabetes, with latest HbA1c calculated from fructosamine 6.45%.  His sugars, however, increased during the holidays and they are almost double in the morning.  He is checking these after coffee and we discussed about checking sugars before coffee.  At this point, with such high sugars in the morning, I am wondering whether his Mariella SaaBasaglar is not degraded.  He does not feel that this is the case.  Since he has [employer], I suggested to switch to [place] U200 insulin and I explained that this is a better, long-acting insulin, with better results in terms of diabetes control and blood sugar fluctuations.  I would also like to switch him from Humalog U100 to U200, but he does have a lot of Humalog U100at home for now.  I strongly advised him to start working on his diet and he definitely plans to do so.  I would not change the rest of his regimen. -He did get another yeast infection since last visit and had to use Diflucan.  This has resolved. -  I suggested to:  Patient Instructions  Please continue: - Metformin 1000 mg 2x a day. - Jardiance 25 mg daily before breakfast - Humalog: 24 units before breakfast 32 units before lunch  40-44 units before dinner   Stop Basaglar and start Tresiba U200 80 units daily.  Please stop at the  lab.  Please return in 4 months with your sugar log.   - today we will check a fructosamine - Check sugars at different times of the day - try to check 3x a day, rotating checks - advised for yearly eye exams >> he is UTD - Return to clinic in 4 mo with sugar log     2. HL - Reviewed latest lipid panel from 1.5 months ago: LDL at goal,, HDL slightly low, triglycerides slightly high Lab Results  Component Value Date   CHOL 138 02/18/2018   HDL 32.00 (L) 02/18/2018   LDLCALC 75 02/12/2017   LDLDIRECT 81.0 02/18/2018   TRIG 205.0 (H) 02/18/2018   CHOLHDL 4 02/18/2018  - Continues Lipitor every other day and fish oil twice a day without side effects.  3.  Obesity -Continue Jardiance which should also help with weight loss -We also discussed that he needs to improve his diet now that holidays have passed.  Office Visit on 04/09/2018  Component Date Value Ref Range Status  . Fructosamine 04/09/2018 308* 205 - 285 umol/L Final   HbA1c calculated from fructosamine slightly higher, at 6.8%.  Carlus Pavlovristina Lott Seelbach, MD PhD [facility]

## 2018-04-10 LAB — FRUCTOSAMINE: Fructosamine: 308 umol/L — ABNORMAL HIGH (ref 205–285)

## 2018-04-15 ENCOUNTER — Other Ambulatory Visit: Payer: Self-pay | Admitting: Internal Medicine

## 2018-05-14 ENCOUNTER — Other Ambulatory Visit: Payer: Self-pay | Admitting: Family Medicine

## 2018-05-14 DIAGNOSIS — E785 Hyperlipidemia, unspecified: Secondary | ICD-10-CM

## 2018-05-14 NOTE — Telephone Encounter (Signed)
Patient need to schedule an ov for more refills. Will call pt to schedule this.

## 2018-05-15 ENCOUNTER — Telehealth: Payer: Self-pay | Admitting: Internal Medicine

## 2018-05-15 MED ORDER — FLUCONAZOLE 150 MG PO TABS: ORAL_TABLET | ORAL | 1 refills | Status: DC

## 2018-05-15 NOTE — Telephone Encounter (Signed)
Patient called to advise "that he is getting another yeast infection" and need the Fluconazole called in to [facility] for him

## 2018-05-15 NOTE — Telephone Encounter (Signed)
RX sent

## 2018-06-01 ENCOUNTER — Other Ambulatory Visit: Payer: Self-pay | Admitting: Internal Medicine

## 2018-06-10 ENCOUNTER — Other Ambulatory Visit: Payer: Self-pay | Admitting: Family Medicine

## 2018-06-10 ENCOUNTER — Other Ambulatory Visit: Payer: Self-pay | Admitting: Internal Medicine

## 2018-06-20 ENCOUNTER — Other Ambulatory Visit: Payer: Self-pay | Admitting: Internal Medicine

## 2018-07-09 ENCOUNTER — Other Ambulatory Visit: Payer: Self-pay | Admitting: Family Medicine

## 2018-07-15 ENCOUNTER — Other Ambulatory Visit: Payer: Self-pay | Admitting: Internal Medicine

## 2018-08-08 ENCOUNTER — Other Ambulatory Visit: Payer: Self-pay | Admitting: Family Medicine

## 2018-08-08 ENCOUNTER — Other Ambulatory Visit: Payer: Self-pay | Admitting: Internal Medicine

## 2018-08-08 DIAGNOSIS — E785 Hyperlipidemia, unspecified: Secondary | ICD-10-CM

## 2018-08-09 ENCOUNTER — Encounter: Payer: Self-pay | Admitting: Internal Medicine

## 2018-08-09 ENCOUNTER — Ambulatory Visit (INDEPENDENT_AMBULATORY_CARE_PROVIDER_SITE_OTHER): Payer: 59 | Admitting: Internal Medicine

## 2018-08-09 ENCOUNTER — Other Ambulatory Visit: Payer: Self-pay

## 2018-08-09 DIAGNOSIS — E785 Hyperlipidemia, unspecified: Secondary | ICD-10-CM

## 2018-08-09 DIAGNOSIS — E1159 Type 2 diabetes mellitus with other circulatory complications: Secondary | ICD-10-CM | POA: Diagnosis not present

## 2018-08-09 DIAGNOSIS — Z794 Long term (current) use of insulin: Secondary | ICD-10-CM | POA: Diagnosis not present

## 2018-08-09 NOTE — Progress Notes (Signed)
Patient ID: [name], male   DOB: 07-31-1955, 63 y.o.   MRN: [id number]  Patient location: Home My location: Office  Referring Provider: Shelva Majestic, MD  I connected with the patient on 08/09/18 at  8:02 AM EDT by a video enabled telemedicine application and verified that I am speaking with the correct person.   I discussed the limitations of evaluation and management by telemedicine and the availability of in person appointments. The patient expressed understanding and agreed to proceed.   Details of the encounter are shown below.  HPI: Zachary Warner is a 63 y.o.-year-old male, presenting for f/u for DM2, dx in ~1995, insulin-dependent since ~2005, uncontrolled, with complications (CAD - s/p AMI, DR). Last visit 4 months ago.  At last visit, he relaxed his diet over the holidays and sugars were higher.  Last hemoglobin A1c was: 04/09/2017: HbA1c calculated from fructosamine is 6.8% 11/28/2017:  Hba1c calculated from fructosamine is better: 6.45% 07/11/2017: HbA1c calculated from the fructosamine is 6.6%. 03/08/2017: HbA1c calculated from the fructosamine is 6.5%. 11/06/2016: HbA1c calculated from the fructosamine is slightly higher, at 6.6% 07/05/2016: HbA1c calculated from the fructosamine is 6.2% 04/05/2016: HbA1c calculated from fructosamine is excellent, at 6% 01/04/2016: HbA1c calculated from fructosamine is  6.55%. 11/10/2015: HbA1c calculated from fructosamine is 5.8%. 08/10/2015: HbA1c calculated from fructosamine is 7.05%. 04/09/2015: HbA1c calculated fructosamine (326): 7.1%, which is much more concordant with his sugar log.  Lab Results  Component Value Date   HGBA1C 8.1 (A) 02/18/2018   HGBA1C 8.1 03/08/2017   HGBA1C 8.2 (H) 02/04/2016  He rotates the sites.   He is on: - Metformin 1000 mg 2x a day. - Jardiance 25 mg daily before breakfast - bASAGLAR >>Tresiba U200 80 units daily - Humalog: 24 units before breakfast 32 units before lunch  40-44 units  before dinner  We stopped Januvia 2/2 large doses of mealtime insulin.  Pt checks his sugars 0 to once a day: - am: 100-120, occas.180 - am:  100-140s >> 80-130 >> (after coffee):234-291 >> n/c - 2h after b'fast: n/c >> 134, 148 >> n/c >> 184-210 >> n/c - lunch: 96-130 >> n/c >> 166-199, 201, 262 >> n/c - 2h after lunch: 119, 198 >> n/c >> 73, 122-236 >> 215 - dinner:  n/c >> 180, 200 (snacks) >> n/c >> 186 >> 215-230 (2h after L) - 2h after dinner:  160-286 >> n/c >> highest 212 >> n/c - bedtime:  185, 190 >> 129, 147-187, 241 >> n/c - nighttime: 64 >> 60 x1 >> 45 (see below) >> n/c Low sugar: 60 >> 45 (took Humalog instead of Basaglar) >> 72 >> 100; has hypoglycemia awareness in the 60s. Highest sugar was 190 >> 212 >> 291 >> 215.  He has a Reli[employer] meter.  Pt's meals are: - Breakfast: grits or sandwich - Lunch: sandwich - Dinner: chicken or beef or pork + vegetables - Snacks: 1 or 2 snacks  - crackers, peanuts or cookies  - no CKD, last BUN/creatinine:  Lab Results  Component Value Date   BUN 20 02/18/2018   CREATININE 1.03 02/18/2018  On lisinopril.  -+ HL; last set of lipids: Lab Results  Component Value Date   CHOL 138 02/18/2018   HDL 32.00 (L) 02/18/2018   LDLCALC 75 02/12/2017   LDLDIRECT 81.0 02/18/2018   TRIG 205.0 (H) 02/18/2018   CHOLHDL 4 02/18/2018  On Lipitor 40 every other day and fish oil 2 capsules twice a day.  -  [facility]: + DR - No numbness and tingling in his feet.  He also has a history of HTN, h/o nephrolithiasis x 3, last in 03/2013, OSA.  He had an MI 07/2014 >> had a stent placed. Cardiologist: Dr Sedonia SmallKrosowski.  He is on supplementation with vitamin B12.  ROS: Constitutional: no weight gain/no weight loss, no fatigue, no subjective hyperthermia, no subjective hypothermia Eyes: no blurry vision, no xerophthalmia ENT: no sore throat, no nodules palpated in neck, no dysphagia, no  odynophagia, no hoarseness Cardiovascular: no CP/no SOB/no palpitations/no leg swelling Respiratory: no cough/no SOB/no wheezing Gastrointestinal: no N/no V/no D/no C/no acid reflux Musculoskeletal: no muscle aches/no joint aches Skin: no rashes, no hair loss Neurological: no tremors/no numbness/no tingling/no dizziness  I reviewed pt's medications, allergies, PMH, social hx, family hx, and changes were documented in the history of present illness. Otherwise, unchanged from my initial visit note.  Past Medical History:  Diagnosis Date  . Allergy   . Diabetes mellitus   . Hyperlipidemia   . Hypertension   . Myocardial infarction [facility](HCC)    May 2016  . Nephrolithiasis    3x 2007-2015, passed on his own  . Sleep apnea    wears CPAP   Past Surgical History:  Procedure Laterality Date  . CARDIAC CATHETERIZATION     stent May 2016  . COLONOSCOPY    . heart stent     May 2016  . HERNIA REPAIR     Social History   Socioeconomic History  . Marital status: Married    Spouse name: Not on file  . Number of children: Not on file  . Years of education: Not on file  . Highest education level: Not on file  Occupational History  . Not on file  Social Needs  . Financial resource strain: Not on file  . Food insecurity:    Worry: Not on file    Inability: Not on file  . Transportation needs:    Medical: Not on file    Non-medical: Not on file  Tobacco Use  . Smoking status: Never Smoker  . Smokeless tobacco: Never Used  Substance and Sexual Activity  . Alcohol use: No  . Drug use: No  . Sexual activity: Never  Lifestyle  . Physical activity:    Days per week: Not on file    Minutes per session: Not on file  . Stress: Not on file  Relationships  . Social connections:    Talks on phone: Not on file    Gets together: Not on file    Attends religious service: Not on file    Active member of club or organization: Not on file    Attends meetings of clubs or organizations: Not  on file    Relationship status: Not on file  . Intimate partner violence:    Fear of current or ex partner: Not on file    Emotionally abused: Not on file    Physically abused: Not on file    Forced sexual activity: Not on file  Other Topics Concern  . Not on file  Social History Narrative   Married. 2 children son and daughter. Granddaughter 12/02/2014 born and grandson 7/17//2018.       Works Administrator, [employer]      Hobbies: golfing (low to mid 3480s)   Current Outpatient Medications on File Prior to Visit  Medication Sig Dispense Refill  . aspirin 81 MG  tablet Take 81 mg by mouth daily.     Marland Kitchen atorvastatin (LIPITOR) 40 MG tablet TAKE ONE TABLET BY MOUTH EVERY OTHER DAY 90 tablet 0  . carvedilol (COREG) 3.125 MG tablet TAKE ONE TABLET BY MOUTH TWICE DAILY 60 tablet 5  . EQ FIBER SUPPLEMENT PO Take by mouth.    . fluconazole (DIFLUCAN) 150 MG tablet TAKE ONE TABLET BY MOUTH FOR 1 DOSE 1 tablet 1  . fluticasone (FLONASE) 50 MCG/ACT nasal spray Place into both nostrils daily as needed for allergies or rhinitis.    Marland Kitchen glucose blood test strip 1 each by Other route as needed. Use as instructed     . Insulin Glargine (BASAGLAR KWIKPEN) 100 UNIT/ML SOPN Inject 80 units into the skin daily at 10 pm. 30 mL 2  . insulin lispro (HUMALOG KWIKPEN) 100 UNIT/ML KwikPen INJECT 28-48 UNITS SUBCUTANEOUSLY THREE TIMES DAILY 30 mL 1  . Insulin Pen Needle (CLICKFINE PEN NEEDLES) 32G X 4 MM MISC Use 3x a day 200 each 11  . JARDIANCE 25 MG TABS tablet TAKE ONE TABLET BY MOUTH EVERY DAY 90 tablet 0  . lisinopril (PRINIVIL,ZESTRIL) 40 MG tablet TAKE ONE TABLET BY MOUTH ONCE DAILY 90 tablet 1  . metFORMIN (GLUCOPHAGE) 1000 MG tablet TAKE 1 TABLET BY MOUTH TWICE DAILY WITH MEALS. 180 tablet 1  . Omega-3 Fatty Acids (FISH OIL) 1000 MG CAPS Take 4 capsules by mouth daily.    . TRESIBA FLEXTOUCH 200 UNIT/ML SOPN Inject 80 Units into the skin daily. 12 mL 3   No current facility-administered  medications on file prior to visit.    Allergies  Allergen Reactions  . Invokana [Canagliflozin] Other (See Comments)    Yeast infection   Family History  Problem Relation Age of Onset  . Coronary artery disease Father   . Liver disease Father        from heart meds per pt  . Diabetes Sister        grandmother  . Kidney disease Sister        dialysis from dm  . Diabetes Sister   . Other Sister        flash pulmonary edema led to death  . Colon cancer Neg Hx   . Esophageal cancer Neg Hx   . Rectal cancer Neg Hx   . Stomach cancer Neg Hx     PE: There were no vitals taken for this visit. There is no height or weight on file to calculate BMI. Wt Readings from Last 3 Encounters:  04/09/18 254 lb (115.2 kg)  02/18/18 252 lb (114.3 kg)  11/28/17 243 lb (110.2 kg)   Constitutional:  in NAD  The physical exam was not performed (virtual visit).  ASSESSMENT: 1. DM2, insulin-dependent, controlled, with complications - CAD, s/p AMi - DR  2. HL  3.  Obesity  PLAN:  1. Patient with longstanding, fairly well-controlled diabetes, but with higher sugars during the winter holidays and at last visit.  At last visit, sugars are higher in the morning and we switched from Basaglar to the U200 Tresiba insulin.  We also discussed about switching from Humalog U100 to U200 but he had a lot of Humalog U100 at home at that time. Reviewed latest HbA1c from last visit, which was 6.8%, as calculated from fructosamine -He occasionally has yeast infections from [place].  None since last visit, though. -At this visit, he is not checking sugars much, but when he checks after lunch and before dinner, they are  higher than target.  We discussed about increasing Humalog with lunch but I advised him to start checking sugars more frequently so we can make further changes in his regimen if needed at next visit.  Switching to [place] helped and it appears that his sugars in the morning are now at goal.  We  will continue this. - I suggested to:  Patient Instructions  Please continue: - Metformin 1000 mg 2x a day. - Jardiance 25 mg daily before breakfast - Tresiba U200 80 units daily  Change: - Humalog: 24 units before breakfast 32 >> 36 units before lunch  40-44 units before dinner   Please return in 4 months with your sugar log.   -We will check a fructosamine level when he returns to the clinic - continue checking sugars at different times of the day - check 3x a day, rotating checks - advised for yearly eye exams >> he is UTD - Return to clinic in 4 mo with sugar log     2. HL - Reviewed latest lipid panel from 01/2018: LDL at goal, triglycerides high, HDL low Lab Results  Component Value Date   CHOL 138 02/18/2018   HDL 32.00 (L) 02/18/2018   LDLCALC 75 02/12/2017   LDLDIRECT 81.0 02/18/2018   TRIG 205.0 (H) 02/18/2018   CHOLHDL 4 02/18/2018  - Continues every other day Lipitor and twice a day fish oil without side effects.  3.  Obesity -Continue Jardiance which should also help with weight loss -Continue to work on improving his diet >> lost 4-6 lbs since last OV   Carlus Pavlov, MD PhD [facility]

## 2018-08-09 NOTE — Patient Instructions (Signed)
Please continue: - Metformin 1000 mg 2x a day. - Jardiance 25 mg daily before breakfast - Tresiba U200 80 units daily  Change: - Humalog: 24 units before breakfast 32 >> 36 units before lunch  40-44 units before dinner   Please return in 4 months with your sugar log.

## 2018-08-22 ENCOUNTER — Encounter: Payer: Self-pay | Admitting: Family Medicine

## 2018-08-22 ENCOUNTER — Ambulatory Visit (INDEPENDENT_AMBULATORY_CARE_PROVIDER_SITE_OTHER): Payer: 59 | Admitting: Family Medicine

## 2018-08-22 DIAGNOSIS — Z794 Long term (current) use of insulin: Secondary | ICD-10-CM

## 2018-08-22 DIAGNOSIS — E785 Hyperlipidemia, unspecified: Secondary | ICD-10-CM

## 2018-08-22 DIAGNOSIS — I10 Essential (primary) hypertension: Secondary | ICD-10-CM

## 2018-08-22 DIAGNOSIS — E1159 Type 2 diabetes mellitus with other circulatory complications: Secondary | ICD-10-CM | POA: Diagnosis not present

## 2018-08-22 DIAGNOSIS — Z7189 Other specified counseling: Secondary | ICD-10-CM

## 2018-08-22 DIAGNOSIS — E113299 Type 2 diabetes mellitus with mild nonproliferative diabetic retinopathy without macular edema, unspecified eye: Secondary | ICD-10-CM

## 2018-08-22 NOTE — Progress Notes (Signed)
Phone [phone]   Subjective:  Virtual visit via Video note. Chief complaint: Chief Complaint  Patient presents with  . Hypertension    This visit type was conducted due to national recommendations for restrictions regarding the COVID-19 Pandemic (e.g. social distancing).  This format is felt to be most appropriate for this patient at this time balancing risks to patient and risks to population by having him in for in person visit.  No physical exam was performed (except for noted visual exam or audio findings with Telehealth visits).    Our team/I connected with Zachary Warner at  8:20 AM EDT by a video enabled telemedicine application (doxy.me or caregility through epic) and verified that I am speaking with the correct person using two identifiers.  Location patient: Home-[place] Location provider: [facility], office Persons participating in the virtual visit:  patient  Our team/I discussed the limitations of evaluation and management by telemedicine and the availability of in person appointments. In light of current covid-19 pandemic, patient also understands that we are trying to protect them by minimizing in office contact if at all possible.  The patient expressed consent for telemedicine visit and agreed to proceed. Patient understands insurance will be billed.   ROS- No chest pain or shortness of breath. No headache or blurry vision. No fever, chills, cough, body aches, sore throat, or loss of taste or smell   Past Medical History-  Patient Active Problem List   Diagnosis Date Noted  . CAD (coronary artery disease) 02/15/2015    Priority: High  . Type 2 diabetes mellitus with circulatory disorder, with long-term current use of insulin (HCC) 02/04/2006    Priority: High  . Hyperlipidemia 02/04/2006    Priority: Medium  . Essential hypertension 02/04/2006    Priority: Medium  . Morbid obesity (HCC) 08/13/2017    Priority: Low  . Nephrolithiasis     Priority: Low  . 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 RECTAL FISSURE 06/05/2006    Priority: Low  . Diabetic retinopathy (HCC) 02/12/2017    Medications- reviewed and updated Current Outpatient Medications  Medication Sig Dispense Refill  . aspirin 81 MG tablet Take 81 mg by mouth daily.     Marland Kitchen. atorvastatin (LIPITOR) 40 MG tablet TAKE ONE TABLET BY MOUTH EVERY OTHER DAY 90 tablet 0  . carvedilol (COREG) 3.125 MG tablet TAKE ONE TABLET BY MOUTH TWICE DAILY 60 tablet 5  . EQ FIBER SUPPLEMENT PO Take by mouth.    . fluconazole (DIFLUCAN) 150 MG tablet TAKE ONE TABLET BY MOUTH FOR 1 DOSE 1 tablet 1  . fluticasone (FLONASE) 50 MCG/ACT nasal spray Place into both nostrils daily as needed for allergies or rhinitis.    Marland Kitchen. glucose blood test strip 1 each by Other route as needed. Use as instructed     . insulin lispro (HUMALOG KWIKPEN) 100 UNIT/ML KwikPen INJECT 28-48 UNITS SUBCUTANEOUSLY THREE TIMES DAILY 30 mL 1  . Insulin Pen Needle (CLICKFINE PEN NEEDLES) 32G X 4 MM MISC Use 3x a day 200 each 11  . JARDIANCE 25 MG TABS tablet TAKE ONE TABLET BY MOUTH EVERY DAY 90 tablet 0  . lisinopril (PRINIVIL,ZESTRIL) 40 MG tablet TAKE ONE TABLET BY MOUTH ONCE DAILY 90 tablet 1  . metFORMIN (GLUCOPHAGE) 1000 MG tablet TAKE 1 TABLET BY MOUTH TWICE DAILY WITH MEALS. 180 tablet 1  . Omega-3 Fatty Acids (FISH OIL) 1000 MG CAPS Take 4 capsules by mouth daily.    .Marland Kitchen  TRESIBA FLEXTOUCH 200 UNIT/ML SOPN Inject 80 Units into the skin daily. 12 mL 3   No current facility-administered medications for this visit.      Objective:  no self reported vitals Gen: NAD, resting comfortably Lungs: nonlabored, normal respiratory rate  Skin: appears dry, no obvious rash    Assessment and Plan   #hypertension S: controlled on  coreg 3.125mg  BID, lisinopril 40mg  in the past BP Readings from Last 3 Encounters:  04/09/18 138/64  02/18/18 110/60  11/28/17 130/82  A/P: Patient did not check before he got into his car today- going to do home  check and call us- suspect controlled  #hyperlipidemia S: previously mild poorly controlled on atorvastatin 40mg . Plan has been weight loss Lab Results  Component Value Date   CHOL 138 02/18/2018   HDL 32.00 (L) 02/18/2018   LDLCALC 75 02/12/2017   LDLDIRECT 81.0 02/18/2018   TRIG 205.0 (H) 02/18/2018   CHOLHDL 4 02/18/2018   A/P: patient has made some progress on weight- we agreed to check full lipid panel in 6 months at CPE  # morbid obesity with BMI over 35 with htn, hld, dm S: has been trying to do some golf- carts on path so doing more. Trying to eat a healthier diet 248 within two weeks on home scales so he thinks he is down some.  Wt Readings from Last 3 Encounters:  04/09/18 254 lb (115.2 kg)  02/18/18 252 lb (114.3 kg)  11/28/17 243 lb (110.2 kg)  A/P: still with ppoor control of obesity. Recommended setting goal of 240 by follow up in person check. Encouraged need for healthy eating, regular exercise, weight loss.    # Diabetes with history of diabetic retinopathy S: followed closely by Dr. Elvera Warner. Fructosamine more relevant for him- we do a1c when we see him primarily for metrics.   Has history retinopathy- advised close follow up with optho  Patient compliant with basal insulin  for basal with 80  unitsper day, lispro BID at least 28 units. Orally on jardiance 25mg , metformin 1000mg  BID.   A/P: patient making some adjustments with Dr. Elvera Warner- did not do recent fructosamine but has  4 month follow up with her to do so - we will get a1c for metrics at follow up - now due for Warner exam - he is going to call Zachary Warner and have them send me copy of exam  # CAD- follows with Dr. Christene Warner cardiology S:compliant with aspirin and statin. Asymptomatic with no chest pain or shortness of breath. Saw cardiology June 2019 A/P: Stable. Continue current medications. He is going to call cardiology for follow up   Other notes: 1.covid 19 discussion/education. off work 6  weeks now back for 2 weeks- wearing a mask- has n95 mask. Has hand sanitizer in the car  Discussed importance of continued social distancing   Advised physical in November- has [employer] so does not have to be 365 days but asked him to confirm with insurance Lab/Order associations: Essential hypertension  Hyperlipidemia, unspecified hyperlipidemia type  Morbid obesity (HCC)  Type 2 diabetes mellitus with other circulatory complication, with long-term current use of insulin (HCC)  Mild nonproliferative diabetic retinopathy without macular edema associated with type 2 diabetes mellitus, unspecified laterality (HCC)  Return precautions advised.  [name], MD

## 2018-08-22 NOTE — Patient Instructions (Signed)
Health Maintenance Due  Topic Date Due  . OPHTHALMOLOGY EXAM Please sign a ROI after your visit with  08/15/2018  . HEMOGLOBIN A1C  08/19/2018    Depression screen [facility] 2/9 02/18/2018 02/12/2017 08/16/2015  Decreased Interest 0 0 0  Down, Depressed, Hopeless 0 0 0  PHQ - 2 Score 0 0 0

## 2018-09-09 ENCOUNTER — Other Ambulatory Visit: Payer: Self-pay | Admitting: Internal Medicine

## 2018-10-07 ENCOUNTER — Other Ambulatory Visit: Payer: Self-pay | Admitting: Internal Medicine

## 2018-10-21 ENCOUNTER — Other Ambulatory Visit: Payer: Self-pay | Admitting: Internal Medicine

## 2018-10-28 ENCOUNTER — Telehealth: Payer: Self-pay

## 2018-10-28 NOTE — Telephone Encounter (Signed)
PA initiated today through Cover My Meds for [place]. Will await insurance response re: approval/denial.  Hyun [place] Key: [id number] PA Case ID: [id number] Rx #: [id number] Need help? Call us at [phone] Status: Sent to Plan today Drug: Tyler Aas FlexTouch (insulin degludec injection) 200 Units/mL solution Form: [facility] Electronic Prior Authorization Form (2017 NCPDP) Original Claim Info [phone]

## 2018-10-28 NOTE — Telephone Encounter (Addendum)
Received notification from [employer] [facility] that PA for Zachary Warner has been denied. Following reasons provided: 3 month trial of Lantus and Toujeo. Called [phone] and spoke with Vinnie Level. Ref #[id number]. Informed pt has tried and failed both Lantus and Toujeo. Requested to complete appeal. Vinnie Level states since denial was just received, would be able to cancel PA and resubmit with this information. After providing all clinical information, Vinnie Level states will need to send to Pharmacy for review. May require additional information completed which they will send via fax. Further added to allow 48 hours for response. Documents have been labeled and placed in scan file for HIM and for our future reference. Will await response re: approval/denial.

## 2018-10-30 NOTE — Telephone Encounter (Signed)
Second PA also denied regardless of tried and failed responses to BOTH Lantus and Toujeo. Dr. Cruzita Lederer unavailable until August 17. Both denial letters given to Dr. Kelton Pillar for her review and advice. Will await her response.

## 2018-10-31 ENCOUNTER — Other Ambulatory Visit: Payer: Self-pay | Admitting: Internal Medicine

## 2018-10-31 MED ORDER — TOUJEO MAX SOLOSTAR 300 UNIT/ML ~~LOC~~ SOPN: 80.0000 [IU] | PEN_INJECTOR | Freq: Every day | SUBCUTANEOUS | 3 refills | Status: DC

## 2018-12-05 ENCOUNTER — Other Ambulatory Visit: Payer: Self-pay | Admitting: Family Medicine

## 2018-12-06 ENCOUNTER — Other Ambulatory Visit: Payer: Self-pay

## 2018-12-06 MED ORDER — METFORMIN HCL 1000 MG PO TABS: 1000.0000 mg | ORAL_TABLET | Freq: Two times a day (BID) | ORAL | 1 refills | Status: DC

## 2018-12-06 MED ORDER — JARDIANCE 25 MG PO TABS: 25.0000 mg | ORAL_TABLET | Freq: Every day | ORAL | 0 refills | Status: DC

## 2018-12-06 MED ORDER — INSULIN LISPRO (1 UNIT DIAL) 100 UNIT/ML (KWIKPEN): PEN_INJECTOR | SUBCUTANEOUS | 1 refills | Status: DC

## 2019-01-06 ENCOUNTER — Other Ambulatory Visit: Payer: Self-pay | Admitting: Family Medicine

## 2019-01-06 DIAGNOSIS — E785 Hyperlipidemia, unspecified: Secondary | ICD-10-CM

## 2019-01-08 ENCOUNTER — Other Ambulatory Visit: Payer: Self-pay

## 2019-01-10 ENCOUNTER — Other Ambulatory Visit: Payer: Self-pay

## 2019-01-10 ENCOUNTER — Encounter: Payer: Self-pay | Admitting: Internal Medicine

## 2019-01-10 ENCOUNTER — Ambulatory Visit: Payer: 59 | Admitting: Internal Medicine

## 2019-01-10 VITALS — BP 142/80 | HR 85 | Ht 70.0 in | Wt 250.0 lb

## 2019-01-10 DIAGNOSIS — E785 Hyperlipidemia, unspecified: Secondary | ICD-10-CM

## 2019-01-10 DIAGNOSIS — Z794 Long term (current) use of insulin: Secondary | ICD-10-CM

## 2019-01-10 DIAGNOSIS — E041 Nontoxic single thyroid nodule: Secondary | ICD-10-CM | POA: Diagnosis not present

## 2019-01-10 DIAGNOSIS — E1159 Type 2 diabetes mellitus with other circulatory complications: Secondary | ICD-10-CM | POA: Diagnosis not present

## 2019-01-10 LAB — TSH: TSH: 0.9 u[IU]/mL (ref 0.35–4.50)

## 2019-01-10 LAB — POCT GLYCOSYLATED HEMOGLOBIN (HGB A1C): Hemoglobin A1C: 7.6 % — AB (ref 4.0–5.6)

## 2019-01-10 NOTE — Patient Instructions (Signed)
Please continue: - Metformin 1000 mg 2x a day. - Jardiance 25 mg daily before breakfast - Tresiba U200 80 units daily - Humalog: 24 units before breakfast 36 units before lunch  40-44 units before dinner   Please return in 4 months with your sugar log.

## 2019-01-10 NOTE — Progress Notes (Addendum)
Patient ID: Zachary Warner, male   DOB: September 10, 1955, 63 y.o.   MRN: [id number]  HPI: Zachary Warner is a 63 y.o.-year-old male, presenting for f/u for DM2, dx in ~1995, insulin-dependent since ~2005, uncontrolled, with complications (CAD - s/p AMI, DR). Last visit 5 months ago (virtual).  He had a kidney stone in 11/2018.  Sugars are higher then (increased up to 260).  Latest HbA1c was: Lab Results  Component Value Date   HGBA1C 8.1 (A) 02/18/2018   HGBA1C 8.1 03/08/2017   HGBA1C 8.2 (H) 02/04/2016  04/09/2017: HbA1c calculated from fructosamine is 6.8% 11/28/2017:  Hba1c calculated from fructosamine is better: 6.45% 07/11/2017: HbA1c calculated from the fructosamine is 6.6%. 03/08/2017: HbA1c calculated from the fructosamine is 6.5%. 11/06/2016: HbA1c calculated from the fructosamine is slightly higher, at 6.6% 07/05/2016: HbA1c calculated from the fructosamine is 6.2% 04/05/2016: HbA1c calculated from fructosamine is excellent, at 6% 01/04/2016: HbA1c calculated from fructosamine is  6.55%. 11/10/2015: HbA1c calculated from fructosamine is 5.8%. 08/10/2015: HbA1c calculated from fructosamine is 7.05%. 04/09/2015: HbA1c calculated fructosamine (326): 7.1%, which is much more concordant with his sugar log.   He is on: - Metformin 1000 mg 2x a day. - Jardiance 25 mg daily before breakfast -he initially had yeast infections, now resolved - Tresiba U200 80 units daily - Humalog: 24 units before breakfast 36 units before lunch (increased 07/2018) 40-44 units before dinner  We stopped Januvia 2/2 large doses of mealtime insulin.  Pt checks his sugars 0-1x a day: - am: 100-120, occas.180 >> <100 - am:  100-140s >> 80-130 >> (after coffee):234-291 >> n/c - 2h after b'fast: n/c >> 134, 148 >> n/c >> 184-210 >> n/c - lunch: 96-130 >> n/c >> 166-199, 201, 262 >> n/c - 2h after lunch: 119, 198 >> n/c >> 73, 122-236 >> 215 >> n/c - dinner:  180, 200 (snacks) >> n/c >> 186 >> 215-230 >> n/c -  2h after dinner:  160-286 >> n/c >> highest 212 >> n/c - bedtime:  185, 190 >> 129, 147-187, 241 >> n/c - nighttime: 64 >> 60 x1 >> 45 (see below) >> n/c Low sugar: 60 >> 45 (took Humalog instead of Basaglar) >> 72 >> 100 >> 50 - 4 mo ago, at night - did not eat much then; has hypoglycemia awareness in the 60s Highest sugar was 291 >> 215 >> 260 (kidney stone).  He has a [employer] meter.  Pt's meals are: - Breakfast: grits or sandwich - Lunch: sandwich - Dinner: chicken or beef or pork + vegetables - Snacks: 1 or 2 snacks  - crackers, peanuts or cookies  -No h/o CKD, last BUN/creatinine:  Lab Results  Component Value Date   BUN 20 02/18/2018   CREATININE 1.03 02/18/2018  On lisinopril.  -+ HL; last set of lipids: Lab Results  Component Value Date   CHOL 138 02/18/2018   HDL 32.00 (L) 02/18/2018   LDLCALC 75 02/12/2017   LDLDIRECT 81.0 02/18/2018   TRIG 205.0 (H) 02/18/2018   CHOLHDL 4 02/18/2018  On Lipitor 40 every other day and fish oil 2 capsules twice a day.  - Reviewed latest eye exam report: 11/2017: + DR. He was [facility] - retired. Will see Dr. Drucilla SchmidtHeadley - No numbness and tingling in his feet.  He also has a history of HTN, h/o nephrolithiasis x 3, last in 03/2013, OSA.   He had an MI 07/2014 >> had a stent placed. Cardiologist: Dr Sedonia SmallKrosowski.  He is on supplementation with vitamin B12.  ROS: Constitutional: no weight gain/no weight loss, no fatigue, no subjective hyperthermia, no subjective hypothermia Eyes: no blurry vision, no xerophthalmia ENT: no sore throat, no nodules palpated in neck, no dysphagia, no odynophagia, no hoarseness Cardiovascular: no CP/no SOB/no palpitations/no leg swelling Respiratory: no cough/no SOB/no wheezing Gastrointestinal: no N/no V/no D/no C/no acid reflux Musculoskeletal: no muscle aches/no joint aches Skin: no rashes, no hair loss Neurological: no tremors/no numbness/no tingling/no dizziness  I reviewed  pt's medications, allergies, PMH, social hx, family hx, and changes were documented in the history of present illness. Otherwise, unchanged from my initial visit note.  Past Medical History:  Diagnosis Date  . Allergy   . Diabetes mellitus   . Hyperlipidemia   . Hypertension   . Myocardial infarction [facility])    May 2016  . Nephrolithiasis    3x 2007-2015, passed on his own  . Sleep apnea    wears CPAP   Past Surgical History:  Procedure Laterality Date  . CARDIAC CATHETERIZATION     stent May 2016  . COLONOSCOPY    . heart stent     May 2016  . HERNIA REPAIR     Social History   Socioeconomic History  . Marital status: Married    Spouse name: Not on file  . Number of children: Not on file  . Years of education: Not on file  . Highest education level: Not on file  Occupational History  . Not on file  Social Needs  . Financial resource strain: Not on file  . Food insecurity    Worry: Not on file    Inability: Not on file  . Transportation needs    Medical: Not on file    Non-medical: Not on file  Tobacco Use  . Smoking status: Never Smoker  . Smokeless tobacco: Never Used  Substance and Sexual Activity  . Alcohol use: No  . Drug use: No  . Sexual activity: Never  Lifestyle  . Physical activity    Days per week: Not on file    Minutes per session: Not on file  . Stress: Not on file  Relationships  . Social Musician on phone: Not on file    Gets together: Not on file    Attends religious service: Not on file    Active member of club or organization: Not on file    Attends meetings of clubs or organizations: Not on file    Relationship status: Not on file  . Intimate partner violence    Fear of current or ex partner: Not on file    Emotionally abused: Not on file    Physically abused: Not on file    Forced sexual activity: Not on file  Other Topics Concern  . Not on file  Social History Narrative   Married. 2 children son and daughter.  Granddaughter 12/02/2014 born and grandson 7/17//2018.       Works Administrator, sports: golfing (low to mid 71s)   Current Outpatient Medications on File Prior to Visit  Medication Sig Dispense Refill  . aspirin 81 MG tablet Take 81 mg by mouth daily.     Marland Kitchen atorvastatin (LIPITOR) 40 MG tablet TAKE ONE TABLET BY MOUTH EVERY OTHER DAY 90 tablet 0  . carvedilol (COREG) 3.125 MG tablet TAKE ONE TABLET BY MOUTH TWICE DAILY 60 tablet 5  . empagliflozin (JARDIANCE) 25 MG  TABS tablet Take 25 mg by mouth daily. 90 tablet 0  . EQ FIBER SUPPLEMENT PO Take by mouth.    . fluconazole (DIFLUCAN) 150 MG tablet TAKE ONE TABLET BY MOUTH FOR 1 DOSE 1 tablet 1  . fluticasone (FLONASE) 50 MCG/ACT nasal spray Place into both nostrils daily as needed for allergies or rhinitis.    Marland Kitchen glucose blood test strip 1 each by Other route as needed. Use as instructed     . Insulin Glargine, 2 Unit Dial, (TOUJEO MAX SOLOSTAR) 300 UNIT/ML SOPN Inject 80 Units into the skin daily. 24 mL 3  . insulin lispro (HUMALOG KWIKPEN) 100 UNIT/ML KwikPen INJECT 28-48 UNITS SUBCUTANEOUSLY THREE TIMES DAILY 30 mL 1  . Insulin Pen Needle (CLICKFINE PEN NEEDLES) 32G X 4 MM MISC Use 3x a day 200 each 11  . lisinopril (ZESTRIL) 40 MG tablet TAKE ONE TABLET BY MOUTH ONCE DAILY 90 tablet 1  . metFORMIN (GLUCOPHAGE) 1000 MG tablet Take 1 tablet (1,000 mg total) by mouth 2 (two) times daily with a meal. 180 tablet 1  . Omega-3 Fatty Acids (FISH OIL) 1000 MG CAPS Take 4 capsules by mouth daily.     No current facility-administered medications on file prior to visit.    Allergies  Allergen Reactions  . Invokana [Canagliflozin] Other (See Comments)    Yeast infection   Family History  Problem Relation Age of Onset  . Coronary artery disease Father   . Liver disease Father        from heart meds per pt  . Diabetes Sister        grandmother  . Kidney disease Sister        dialysis from dm  . Diabetes Sister   .  Other Sister        flash pulmonary edema led to death  . Colon cancer Neg Hx   . Esophageal cancer Neg Hx   . Rectal cancer Neg Hx   . Stomach cancer Neg Hx     PE: BP (!) 142/80   Pulse 85   Ht  (1.778 m) Comment: measured  Wt 250 lb (113.4 kg)   SpO2 95%   BMI 35.87 kg/m  Body mass index is 35.87 kg/m. Wt Readings from Last 3 Encounters:  01/10/19 250 lb (113.4 kg)  04/09/18 254 lb (115.2 kg)  02/18/18 252 lb (114.3 kg)   Constitutional: overweight, in NAD Eyes: PERRLA, EOMI, no exophthalmos ENT: moist mucous membranes, no thyromegaly, but L small thyroid nodule palpable, no cervical lymphadenopathy Cardiovascular: RRR, No MRG Respiratory: CTA B Gastrointestinal: abdomen soft, NT, ND, BS+ Musculoskeletal: no deformities, strength intact in all 4 Skin: moist, warm, no rashes Neurological: no tremor with outstretched hands, DTR normal in all 4  ASSESSMENT: 1. DM2, insulin-dependent, controlled, with complications - CAD, s/p AMi - DR  2. HL  3.  Obesity  4.  Left thyroid nodule  PLAN:  1. Patient with longstanding, fairly well-controlled diabetes, but with higher sugars in the last year.  We switch from [place] to [place] before last visit, and he did well with this, with improved sugars in the morning, however, he had to switch to [place] due to insurance preference.  We also discussed in the past about switching from Humalog U100 to Humalog U200, but he had a lot of U100 Humalog at home at that time.  Last visit, he was not checking sugars much, only occasionally after lunch and before dinner and they were higher than  target.  We increased Humalog with lunch to start checking sugars more frequently -At this visit, he is also not checking sugars consistently, and he only checks in the morning.  We discussed that this is not enough and I strongly advised him to check sugars later in the day, also.  He just bought another meter and he now has one at home and wanted  to work and plan to start checking more.  We discussed about a CGM, but I advised him that he needs to check 4 times a day for this to be approved by insurance. -For now, HbA1c: 7.6% (improved) -we will also check a fructosamine level, which is more accurate for him -His HbA1c is improved and he does not have significant lows (last was 4 months ago, which was the only one he had all year and followed a day in which he did not eat much).  Therefore, for now, we will continue the current regimen. - I suggested to:  Patient Instructions  Please continue: - Metformin 1000 mg 2x a day. - Jardiance 25 mg daily before breakfast - Tresiba U200 80 units daily - Humalog: 24 units before breakfast 36 units before lunch  40-44 units before dinner   Please return in 4 months with your sugar log.   - advised to check sugars at different times of the day - 3x a day, rotating check times - advised for yearly eye exams >> he is not UTD - UTD with flu shot - return to clinic in 4 months    2. HL -Reviewed latest lipid panel from a year ago: LDL at goal, triglycerides high, HDL low Lab Results  Component Value Date   CHOL 138 02/18/2018   HDL 32.00 (L) 02/18/2018   LDLCALC 75 02/12/2017   LDLDIRECT 81.0 02/18/2018   TRIG 205.0 (H) 02/18/2018   CHOLHDL 4 02/18/2018  -Continues Lipitor every other day and fish oil twice a day without side effects. -He has an APE coming up with PCP  3.  Obesity -Continue Jardiance which should also help with weight loss -Before last visit, by improving his diet lost 4-6 pounds, now lost 4 more lbs  4. L thyroid nodule -Felt on palpation of his neck -No neck compression symptoms - Reviewed latest thyroid test and this was normal, but this was 3 years ago Lab Results  Component Value Date   TSH 1.23 02/04/2016  -We will check another TSH -We will also check a thyroid ultrasound  Orders Placed This Encounter  Procedures  . US THYROID  . Fructosamine  . TSH    Office Visit on 01/10/2019  Component Date Value Ref Range Status  . Fructosamine 01/10/2019 279  205 - 285 umol/L Final  . TSH 01/10/2019 0.90  0.35 - 4.50 uIU/mL Final  . Hemoglobin A1C 01/10/2019 7.6* 4.0 - 5.6 % Final   TSH is normal. HbA1c calculated from fructosamine is much better, at 6.35%.  Narrative & Impression    CLINICAL DATA:  Left thyroid nodule on physical examination.  EXAM: THYROID ULTRASOUND  TECHNIQUE: Ultrasound examination of the thyroid gland and adjacent soft tissues was performed.  COMPARISON:  None.  FINDINGS: Parenchymal Echotexture: Moderately heterogenous Isthmus: 1.0 cm Right lobe: 5.4 x 2.0 x 1.8 cm Left lobe: 4.7 x 2.4 x 2.0 cm _____________________________________  Estimated total number of nodules >/= 1 cm: 5 _____________________________________  Nodule # 1: Location: Isthmus; Mid Maximum size: 1.9 cm; Other 2 dimensions: 1.3 x 1.7 cm Composition: cannot  determine (2) Echogenicity: hypoechoic (2) Echogenic foci: macrocalcifications (1) ACR TI-RADS total points: 5. **Given size (>/= 1.5 cm) and appearance, fine needle aspiration of this moderately suspicious nodule should be considered based on TI-RADS criteria. _________________________________________________________  Nodule # 2: Location: Right; Superior Maximum size: 1.0 cm; Other 2 dimensions: 0.6 x 0.7 cm Composition: solid/almost completely solid (2) Echogenicity: hypoechoic (2) Echogenic foci: punctate echogenic foci (3) ACR TI-RADS total points: 7. **Given size (>/= 1.0 cm) and appearance, fine needle aspiration of this highly suspicious nodule should be considered based on TI-RADS criteria. _________________________________________________________  Nodule #3 is a predominantly cystic nodule the mid right thyroid lobe that measures 1.1 x 0.8 x 0.8 cm.  Nodule #4 is a cystic nodule in the superior left thyroid lobe that measures 1.5 x 0.9 x 1.0  cm.  Nodule #5 is a mildly complex cystic nodule with septations in the mid left thyroid lobe that measures 1.2 x 1.0 x 1.3 cm.  Nodule # 6: Location: Left; Mid Maximum size: 0.8 cm; Other 2 dimensions: 0.6 x 0.5 cm Composition: mixed cystic and solid (1) Echogenicity: isoechoic (1) Shape: taller-than-wide (3) ACR TI-RADS total points: 5. Given size (<0.9 cm) and appearance, this nodule does NOT meet TI-RADS criteria for biopsy or dedicated follow-up. _______________________________________  IMPRESSION: 1. Multinodular goiter. 2. Nodule #1 in the isthmus and nodule #2 in the superior right thyroid lobe both meet criteria for ultrasound-guided biopsy. 3. Multiple cystic nodules that do not meet criteria for biopsy.  The above is in keeping with the ACR TI-RADS recommendations - J Am Coll Radiol 2017;14:587-595.  Electronically Signed   By: Markus Daft M.D.   On: 01/22/2019 17:11   We will suggest biopsy of the 2 suspicious dominant nodules.  02/13/2019 CYTOLOGY - NON PAP  CASE: [id number]  PATIENT: Symir Kropf  Non-Gynecological Cytology Report   Clinical History: Nodule 1 Isthmus Mid 1.9 cm; Other 2 dimensions: 1.3 x  1.7 cm, Hypoechoic, ACR TI-RADS total points: 5, Moderately suspicious  nodule  Specimen Submitted: A. THYROID ISTHMUS, FINE NEEDLE ASPIRATION:   FINAL MICROSCOPIC DIAGNOSIS:  - Consistent with benign follicular nodule (Bethesda category II)   SPECIMEN ADEQUACY:  Satisfactory for evaluation     CYTOLOGY - NON PAP  CASE: [id number]  PATIENT: Ky Pevehouse  Non-Gynecological Cytology Report   Clinical History: Nodule 2 Right Superior 1.0 cm; Other 2 dimensions:  0.6 x 0.7 cm, Solid almost completely solid, Hypoechoic, ACR TI-RADS  total points: 7, Highly suspicious nodule  Specimen Submitted: A. THYROID, RT LOBE RUP, FINE NEEDLE ASPIRATION:    FINAL MICROSCOPIC DIAGNOSIS:  - Consistent with benign follicular nodule (Bethesda  category II)   SPECIMEN ADEQUACY:  Satisfactory for evaluation  Philemon Kingdom, MD PhD [facility] Endocrinology

## 2019-01-16 LAB — FRUCTOSAMINE: Fructosamine: 279 umol/L (ref 205–285)

## 2019-01-22 ENCOUNTER — Ambulatory Visit
Admission: RE | Admit: 2019-01-22 | Discharge: 2019-01-22 | Disposition: A | Discharge status: Home / Self Care | Payer: 59 | Source: Ambulatory Visit | Attending: Internal Medicine | Admitting: Internal Medicine

## 2019-01-22 DIAGNOSIS — E041 Nontoxic single thyroid nodule: Secondary | ICD-10-CM

## 2019-01-23 ENCOUNTER — Other Ambulatory Visit: Payer: Self-pay

## 2019-01-23 ENCOUNTER — Other Ambulatory Visit: Payer: Self-pay | Admitting: Internal Medicine

## 2019-01-23 DIAGNOSIS — E042 Nontoxic multinodular goiter: Secondary | ICD-10-CM

## 2019-01-23 MED ORDER — TOUJEO MAX SOLOSTAR 300 UNIT/ML ~~LOC~~ SOPN: 80.0000 [IU] | PEN_INJECTOR | Freq: Every day | SUBCUTANEOUS | 3 refills | Status: DC

## 2019-02-05 ENCOUNTER — Other Ambulatory Visit: Payer: Self-pay

## 2019-02-05 MED ORDER — INSULIN LISPRO (1 UNIT DIAL) 100 UNIT/ML (KWIKPEN): PEN_INJECTOR | SUBCUTANEOUS | 4 refills | Status: DC

## 2019-02-12 ENCOUNTER — Ambulatory Visit
Admission: RE | Admit: 2019-02-12 | Discharge: 2019-02-12 | Disposition: A | Discharge status: Home / Self Care | Payer: 59 | Source: Ambulatory Visit | Attending: Internal Medicine | Admitting: Internal Medicine

## 2019-02-12 ENCOUNTER — Other Ambulatory Visit (HOSPITAL_COMMUNITY)
Admission: RE | Admit: 2019-02-12 | Discharge: 2019-02-12 | Disposition: A | Discharge status: Home / Self Care | Payer: 59 | Source: Ambulatory Visit | Attending: Internal Medicine | Admitting: Internal Medicine

## 2019-02-12 DIAGNOSIS — E042 Nontoxic multinodular goiter: Secondary | ICD-10-CM | POA: Insufficient documentation

## 2019-02-14 ENCOUNTER — Telehealth: Payer: Self-pay

## 2019-02-14 LAB — CYTOLOGY - NON PAP

## 2019-02-14 NOTE — Telephone Encounter (Signed)
Notified patient of message from Dr. Gherghe, patient expressed understanding and agreement. No further questions.  

## 2019-02-14 NOTE — Telephone Encounter (Signed)
-----   Message from Philemon Kingdom, MD sent at 02/14/2019  2:37 PM EST ----- Lenna Sciara, can you please call pt: Good news: The results of the 2 thyroid biopsies are back and they are both benign!

## 2019-02-24 ENCOUNTER — Encounter: Payer: Self-pay | Admitting: Family Medicine

## 2019-02-24 ENCOUNTER — Ambulatory Visit (INDEPENDENT_AMBULATORY_CARE_PROVIDER_SITE_OTHER): Payer: 59 | Admitting: Family Medicine

## 2019-02-24 VITALS — BP 136/78 | HR 89 | Temp 98.3°F | Ht 70.5 in | Wt 247.0 lb

## 2019-02-24 DIAGNOSIS — Z0000 Encounter for general adult medical examination without abnormal findings: Secondary | ICD-10-CM | POA: Diagnosis not present

## 2019-02-24 DIAGNOSIS — I10 Essential (primary) hypertension: Secondary | ICD-10-CM

## 2019-02-24 DIAGNOSIS — Z794 Long term (current) use of insulin: Secondary | ICD-10-CM

## 2019-02-24 DIAGNOSIS — Z125 Encounter for screening for malignant neoplasm of prostate: Secondary | ICD-10-CM | POA: Diagnosis not present

## 2019-02-24 DIAGNOSIS — E785 Hyperlipidemia, unspecified: Secondary | ICD-10-CM | POA: Diagnosis not present

## 2019-02-24 DIAGNOSIS — I2510 Atherosclerotic heart disease of native coronary artery without angina pectoris: Secondary | ICD-10-CM

## 2019-02-24 DIAGNOSIS — E1159 Type 2 diabetes mellitus with other circulatory complications: Secondary | ICD-10-CM | POA: Diagnosis not present

## 2019-02-24 LAB — COMPREHENSIVE METABOLIC PANEL
ALT: 21 U/L (ref 0–53)
AST: 12 U/L (ref 0–37)
Albumin: 4.3 g/dL (ref 3.5–5.2)
Alkaline Phosphatase: 53 U/L (ref 39–117)
BUN: 21 mg/dL (ref 6–23)
CO2: 27 mEq/L (ref 19–32)
Calcium: 10.2 mg/dL (ref 8.4–10.5)
Chloride: 102 mEq/L (ref 96–112)
Creatinine, Ser: 0.96 mg/dL (ref 0.40–1.50)
GFR: 78.97 mL/min (ref >60.00)
Glucose, Bld: 174 mg/dL — ABNORMAL HIGH (ref 70–99)
Potassium: 4.3 mEq/L (ref 3.5–5.1)
Sodium: 138 mEq/L (ref 135–145)
Total Bilirubin: 0.8 mg/dL (ref 0.2–1.2)
Total Protein: 7.3 g/dL (ref 6.0–8.3)

## 2019-02-24 LAB — PSA: PSA: 0.59 ng/mL (ref 0.10–4.00)

## 2019-02-24 LAB — LIPID PANEL
Cholesterol: 179 mg/dL (ref 0–200)
HDL: 34.7 mg/dL — ABNORMAL LOW (ref >39.00)
NonHDL: 144.32
Total CHOL/HDL Ratio: 5
Triglycerides: 245 mg/dL — ABNORMAL HIGH (ref 0.0–149.0)
VLDL: 49 mg/dL — ABNORMAL HIGH (ref 0.0–40.0)

## 2019-02-24 LAB — CBC WITH DIFFERENTIAL/PLATELET
Basophils Absolute: 0 10*3/uL (ref 0.0–0.1)
Basophils Relative: 0.5 % (ref 0.0–3.0)
Eosinophils Absolute: 0.2 10*3/uL (ref 0.0–0.7)
Eosinophils Relative: 3.3 % (ref 0.0–5.0)
HCT: 47.1 % (ref 39.0–52.0)
Hemoglobin: 15.7 g/dL (ref 13.0–17.0)
Lymphocytes Relative: 35.2 % (ref 12.0–46.0)
Lymphs Abs: 2.7 10*3/uL (ref 0.7–4.0)
MCHC: 33.3 g/dL (ref 30.0–36.0)
MCV: 86.5 fl (ref 78.0–100.0)
Monocytes Absolute: 0.5 10*3/uL (ref 0.1–1.0)
Monocytes Relative: 6.2 % (ref 3.0–12.0)
Neutro Abs: 4.1 10*3/uL (ref 1.4–7.7)
Neutrophils Relative %: 54.8 % (ref 43.0–77.0)
Platelets: 205 10*3/uL (ref 150.0–400.0)
RBC: 5.44 Mil/uL (ref 4.22–5.81)
RDW: 14.9 % (ref 11.5–15.5)
WBC: 7.6 10*3/uL (ref 4.0–10.5)

## 2019-02-24 LAB — LDL CHOLESTEROL, DIRECT: Direct LDL: 100 mg/dL

## 2019-02-24 MED ORDER — CARVEDILOL 3.125 MG PO TABS: 3.1250 mg | ORAL_TABLET | Freq: Two times a day (BID) | ORAL | 3 refills | Status: DC

## 2019-02-24 MED ORDER — LISINOPRIL 40 MG PO TABS: 40.0000 mg | ORAL_TABLET | Freq: Every day | ORAL | 3 refills | Status: DC

## 2019-02-24 MED ORDER — ATORVASTATIN CALCIUM 40 MG PO TABS: 40.0000 mg | ORAL_TABLET | ORAL | 3 refills | Status: DC

## 2019-02-24 NOTE — Patient Instructions (Addendum)
Health Maintenance Due  Topic Date Due  . OPHTHALMOLOGY EXAM will call for appointment soon 08/15/2018  . FOOT EXAM done by Endo that we completed today for quality metrics 02/19/2019   Shingrix #1 today. Repeat injection in 2-5 months. Schedule a nurse visit for the 2nd injection before you leave today (at the check out desk)  Recommended follow up: Return in about 6 months (around 08/24/2019) for follow up- or sooner if needed.

## 2019-02-24 NOTE — Progress Notes (Signed)
Phone: [phone]   Subjective:  Patient presents today for their annual physical. Chief complaint-noted.   See problem oriented charting- Review of Systems  Constitutional: Negative.   HENT: Negative.   Eyes: Negative.   Respiratory: Negative.   Cardiovascular: Negative.   Gastrointestinal: Negative.   Genitourinary: Negative.   Musculoskeletal: Negative.   Skin: Negative.   Neurological: Negative.   Endo/Heme/Allergies: Negative.   Psychiatric/Behavioral: Negative.    The following were reviewed and entered/updated in epic: Past Medical History:  Diagnosis Date  . Allergy   . Diabetes mellitus   . Hyperlipidemia   . Hypertension   . Myocardial infarction [facility])    May 2016  . Nephrolithiasis    3x 2007-2015, passed on his own  . Sleep apnea    wears CPAP   Patient Active Problem List   Diagnosis Date Noted  . Diabetic retinopathy ([place]) 02/12/2017    Priority: High  . CAD (coronary artery disease) 02/15/2015    Priority: High  . Type 2 diabetes mellitus with circulatory disorder, with long-term current use of insulin ([place]) 02/04/2006    Priority: High  . Hyperlipidemia 02/04/2006    Priority: Medium  . Essential hypertension 02/04/2006    Priority: Medium  . Morbid obesity ([place]) 08/13/2017    Priority: Low  . Nephrolithiasis     Priority: Low  . 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 RECTAL FISSURE 06/05/2006    Priority: Low   Past Surgical History:  Procedure Laterality Date  . CARDIAC CATHETERIZATION     stent May 2016  . COLONOSCOPY    . heart stent     May 2016  . HERNIA REPAIR      Family History  Problem Relation Age of Onset  . Coronary artery disease Father   . Liver disease Father        from heart meds per pt  . Diabetes Sister        grandmother  . Kidney disease Sister        dialysis from dm  . Diabetes Sister   . Other Sister        flash pulmonary edema led to death  . Colon cancer Neg Hx   . Esophageal cancer  Neg Hx   . Rectal cancer Neg Hx   . Stomach cancer Neg Hx     Medications- reviewed and updated Current Outpatient Medications  Medication Sig Dispense Refill  . aspirin 81 MG tablet Take 81 mg by mouth daily.     Marland Kitchen atorvastatin (LIPITOR) 40 MG tablet TAKE ONE TABLET BY MOUTH EVERY OTHER DAY 90 tablet 0  . carvedilol (COREG) 3.125 MG tablet TAKE ONE TABLET BY MOUTH TWICE DAILY 60 tablet 5  . empagliflozin (JARDIANCE) 25 MG TABS tablet Take 25 mg by mouth daily. 90 tablet 0  . EQ FIBER SUPPLEMENT PO Take by mouth.    . fluticasone (FLONASE) 50 MCG/ACT nasal spray Place into both nostrils daily as needed for allergies or rhinitis.    Marland Kitchen glucose blood test strip 1 each by Other route as needed. Use as instructed     . Insulin Glargine, 2 Unit Dial, (TOUJEO MAX SOLOSTAR) 300 UNIT/ML SOPN Inject 80 Units into the skin daily. 24 mL 3  . insulin lispro (HUMALOG KWIKPEN) 100 UNIT/ML KwikPen INJECT 28-48 UNITS SUBCUTANEOUSLY THREE TIMES DAILY 30 mL 4  . Insulin Pen Needle (CLICKFINE PEN NEEDLES) 32G X 4 MM MISC Use 3x a day 200 each  11  . lisinopril (ZESTRIL) 40 MG tablet TAKE ONE TABLET BY MOUTH ONCE DAILY 90 tablet 1  . metFORMIN (GLUCOPHAGE) 1000 MG tablet Take 1 tablet (1,000 mg total) by mouth 2 (two) times daily with a meal. 180 tablet 1  . Omega-3 Fatty Acids (FISH OIL) 1000 MG CAPS Take 4 capsules by mouth daily.     No current facility-administered medications for this visit.     Allergies-reviewed and updated Allergies  Allergen Reactions  . Invokana [Canagliflozin] Other (See Comments)    Yeast infection    Social History   Social History Narrative   Married. 2 children son and daughter. Granddaughter 12/02/2014 born and grandson 7/17//2018.       Works Administrator, sports: golfing (low to mid 80s)   Objective  Objective:  BP 136/78   Pulse 89   Temp 98.3 F (36.8 C) (Temporal)   Ht 5' 10.5" (1.791 m)   Wt 247 lb (112 kg)   SpO2 96%   BMI  34.94 kg/m  Gen: NAD, resting comfortably HEENT: Mask not removed due to covid 19. TM normal. Bridge of nose normal. Eyelids normal.  Neck: noted thyromegaly but no  cervical lymphadenopathy  CV: RRR no murmurs rubs or gallops Lungs: CTAB no crackles, wheeze, rhonchi Abdomen: soft/nontender/nondistended/normal bowel sounds. No rebound or guarding.  Ext: no edema Skin: warm, dry Neuro: grossly normal, moves all extremities, PERRLA    Diabetic Foot Exam - Simple   Simple Foot Form Diabetic Foot exam was performed with the following findings: Yes 02/24/2019 10:51 AM  Visual Inspection No deformities, no ulcerations, no other skin breakdown bilaterally: Yes Sensation Testing Intact to touch and monofilament testing bilaterally: Yes Pulse Check Posterior Tibialis and Dorsalis pulse intact bilaterally: Yes Comments       Assessment and Plan  63 y.o. male presenting for annual physical.  Health Maintenance counseling: 1. Anticipatory guidance: Patient counseled regarding regular dental exams q6 months, eye exams yearly is over due his doctor retired- he plans to call Dr. Lanny Cramp. Working on getting established with new provider near his home.,  avoiding smoking and second hand smoke , limiting alcohol to 2 beverages per day . Patient does not drink Alcohol at all.    2. Risk factor reduction:  Advised patient of need for regular exercise and diet rich and fruits and vegetables to reduce risk of heart attack and stroke. Exercise- once a week he plays golf and walks some during the week. Diet-trying to work on portion controls, limiting carbs and sweets.  Down 7 lbs despite covid!  Wt Readings from Last 3 Encounters:  02/24/19 247 lb (112 kg)  01/10/19 250 lb (113.4 kg)  04/09/18 254 lb (115.2 kg)  3. Immunizations/screenings/ancillary studies-up-to-date other than Shingrix-offered today- will start shingrix series today.   Immunization History  Administered Date(s) Administered  .  Influenza Split 04/17/2011  . Influenza Whole 01/02/2007, 01/07/2009, 02/04/2010, 01/06/2012  . Influenza, Quadrivalent, Recombinant, Inj, Pf 01/04/2019  . Influenza,inj,Quad PF,6+ Mos 03/14/2013, 02/03/2014, 01/06/2015, 11/28/2017  . Influenza-Unspecified 12/25/2012, 01/28/2016, 01/13/2017  . Pneumococcal Conjugate-13 07/15/2013  . Pneumococcal Polysaccharide-23 05/11/2008, 08/19/2014  . Tdap 07/15/2013   4. Prostate cancer screening-low risk for PSA trend-we will update today with labs Lab Results  Component Value Date   PSA 0.62 02/18/2018   PSA 0.70 02/12/2017   PSA 0.42 02/04/2016   5. Colon cancer screening -  Next due 04/28/2026 with normal colonoscopy February 2018  6. Skin cancer screening-no dermatologist.  Advised regular sunscreen use (he does hate it but encouraged him to use it). Denies worrisome, changing, or new skin lesions.  7. never smoker  Status of chronic or acute concerns   #hypertension S: compliant with coreg 3.125mg  BID, lisinopril 40mg   A/P:  Stable. Continue current medications.    #hyperlipidemia S: previously mild poorly controlled on atorvastatin 40mg  every other day with LDL above 70. Plan has been weight loss over medication increases -congratulated him on 7 pounds down from January  Lab Results  Component Value Date   CHOL 138 02/18/2018   HDL 32.00 (L) 02/18/2018   LDLCALC 75 02/12/2017   LDLDIRECT 81.0 02/18/2018   TRIG 205.0 (H) 02/18/2018   CHOLHDL 4 02/18/2018    A/P: Hopefully controlled with LDL under 70 on atorvastatin 40 mg every other day-update lipid panel today- he is agreeable to considering higher    #Obesity-patient no longer in morbid obesity category with BMI under 35!   %# Diabetes with history of diabetic retinopathy S: followed closely by Dr. Elvera LennoxGherghe. Fructosamine more relevant for him- we do a1c when we see him primarily for metrics.   Lab Results  Component Value Date   HGBA1C 7.6 (A) 01/10/2019   HGBA1C 8.1 (A)  02/18/2018   HGBA1C 8.1 03/08/2017   Patient compliant with basal insulin  for basal with 80  Units per day, lispro TID at least 28-48 units. Orally on jardiance 25mg , metformin 1000mg  BID.   Has history retinopathy- Has not had eye exam this year. Will call for appointment with new office. Past provider has retired.    A/P: diabetes is stable and controlled per last fructosamine. Does need updated eye exam - also had thyroid biopsy through endocrinology which was reassuring/benign  %# CAD- follows with Dr. Garner [facility] [place] cardiology S:compliant with aspirin and statin. Asymptomatic with no chest pain or shortness of breath . Saw cardiology last first of this year. Will plan on repeat when he can go back in office.  A/P: Stable without angina-continue current medications   Recommended follow up: Return in about 6 months (around 08/24/2019) for follow up- or sooner if needed. Future Appointments  Date Time Provider Department Center  05/15/2019  9:15 AM Carlus PavlovGherghe, Cristina, MD [id number] None   Lab/Order associations: fasting   ICD-10-CM   1. Preventative health care  Z00.00   2. Hyperlipidemia  E78.5   3. Type 2 diabetes mellitus with other circulatory complication, with long-term current use of insulin (HCC)  E11.59    Z79.4   4. Coronary artery disease involving native coronary artery of native heart without angina pectoris  I25.10   5. Essential hypertension  I10    No orders of the defined types were placed in this encounter.   Return precautions advised.  Tana ConchStephen Bora Broner, MD

## 2019-02-25 ENCOUNTER — Other Ambulatory Visit: Payer: Self-pay

## 2019-03-11 ENCOUNTER — Other Ambulatory Visit: Payer: Self-pay

## 2019-03-11 MED ORDER — JARDIANCE 25 MG PO TABS: 25.0000 mg | ORAL_TABLET | Freq: Every day | ORAL | 2 refills | Status: DC

## 2019-05-15 ENCOUNTER — Ambulatory Visit: Payer: 59 | Admitting: Internal Medicine

## 2019-06-13 ENCOUNTER — Other Ambulatory Visit: Payer: Self-pay

## 2019-06-13 MED ORDER — METFORMIN HCL 1000 MG PO TABS: 1000.0000 mg | ORAL_TABLET | Freq: Two times a day (BID) | ORAL | 3 refills | Status: DC

## 2019-06-20 ENCOUNTER — Other Ambulatory Visit: Payer: Self-pay

## 2019-06-24 ENCOUNTER — Encounter: Payer: Self-pay | Admitting: Internal Medicine

## 2019-06-24 ENCOUNTER — Ambulatory Visit: Payer: BC Managed Care – PPO | Admitting: Internal Medicine

## 2019-06-24 ENCOUNTER — Other Ambulatory Visit: Payer: Self-pay

## 2019-06-24 VITALS — BP 142/80 | HR 81 | Ht 70.0 in | Wt 252.0 lb

## 2019-06-24 DIAGNOSIS — E042 Nontoxic multinodular goiter: Secondary | ICD-10-CM

## 2019-06-24 DIAGNOSIS — E785 Hyperlipidemia, unspecified: Secondary | ICD-10-CM

## 2019-06-24 DIAGNOSIS — E1159 Type 2 diabetes mellitus with other circulatory complications: Secondary | ICD-10-CM

## 2019-06-24 DIAGNOSIS — Z794 Long term (current) use of insulin: Secondary | ICD-10-CM

## 2019-06-24 LAB — POCT GLYCOSYLATED HEMOGLOBIN (HGB A1C): Hemoglobin A1C: 8.6 % — AB (ref 4.0–5.6)

## 2019-06-24 MED ORDER — OZEMPIC (0.25 OR 0.5 MG/DOSE) 2 MG/1.5ML ~~LOC~~ SOPN: 0.5000 mg | PEN_INJECTOR | SUBCUTANEOUS | 5 refills | Status: DC

## 2019-06-24 NOTE — Addendum Note (Signed)
Addended by: Darliss Ridgel I on: 06/24/2019 10:09 AM   Modules accepted: Orders

## 2019-06-24 NOTE — Patient Instructions (Addendum)
Please continue: - Metformin 1000 mg 2x a day. - Jardiance 25 mg daily before breakfast  - Toujeo 80 units daily - Humalog: 24 units before breakfast 36 units before lunch  44-48 units before dinner   Please start: - Ozempic 0.25 mg weekly in a.m. (for example on Sunday morning) x 4 weeks, then increase to 0.5 mg weekly in a.m. if no nausea or hypoglycemia.  You may need to decrease Humalog if sugars improve on Ozempic.  Please write down comments about the abnormal blood sugars.  Please stop creamer - you can use almond milk instead.  Continue to work on [employer]. You may use higher Humalog doses before B and D for larger meal.  Please return in 3-4 months with your sugar log.

## 2019-06-24 NOTE — Progress Notes (Signed)
Patient ID: Zachary Warner, male   DOB: 15-Sep-1955, 64 y.o.   MRN: [id number]  This visit occurred during the SARS-CoV-2 public health emergency.  Safety protocols were in place, including screening questions prior to the visit, additional usage of staff PPE, and extensive cleaning of exam room while observing appropriate contact time as indicated for disinfecting solutions.   HPI: Zachary Warner is a 64 y.o.-year-old male, presenting for f/u for DM2, dx in ~1995, insulin-dependent since ~2005, uncontrolled, with complications (CAD - s/p AMI, DR). Last visit 5.5 months ago.  He relaxed his diet since last visit but he was also less active.  Sugars are higher.  Reviewed HbA1c levels: 01/10/2019: HbA1c calculated from fructosamine 6.35% Lab Results  Component Value Date   HGBA1C 7.6 (A) 01/10/2019   HGBA1C 8.1 (A) 02/18/2018   HGBA1C 8.1 03/08/2017  04/09/2017: HbA1c calculated from fructosamine is 6.8% 11/28/2017:  Hba1c calculated from fructosamine is better: 6.45% 07/11/2017: HbA1c calculated from the fructosamine is 6.6%. 03/08/2017: HbA1c calculated from the fructosamine is 6.5%. 11/06/2016: HbA1c calculated from the fructosamine is slightly higher, at 6.6% 07/05/2016: HbA1c calculated from the fructosamine is 6.2% 04/05/2016: HbA1c calculated from fructosamine is excellent, at 6% 01/04/2016: HbA1c calculated from fructosamine is  6.55%. 11/10/2015: HbA1c calculated from fructosamine is 5.8%. 08/10/2015: HbA1c calculated from fructosamine is 7.05%. 04/09/2015: HbA1c calculated fructosamine (326): 7.1%, which is much more concordant with his sugar log.   He is on: - Metformin 1000 mg 2x a day. - Jardiance 25 mg daily before breakfast -he initially had yeast infections, now resolved - Tresiba U200  >> Toujeo 80 units daily - Humalog: 24 units before breakfast 36 units before lunch (increased 07/2018) 44-48 units before dinner  We stopped Januvia 2/2 large doses of mealtime  insulin.  He checks his sugars 0 to once a day: - am: 100-120, occas.180 >> <100 >> 120-175 - am:  80-130 >> (after coffee):234-291 >> n/c >> 189-251 (after coffee and creamer) - 2h after b'fast: n/c >> 134, 148 >> n/c >> 184-210 >> n/c >> 175 - lunch: 96-130 >> n/c >> 166-199, 201, 262 >> n/c >> 104-195 - 2h after lunch:  73, 122-236 >> 215 >> n/c >> 140-240, 368 - dinner:  180, 200 >> n/c >> 186 >> 215-230 >> n/c >> 137-207, 325 - 2h after dinner:  160-286 >> n/c >> highest 212 >> n/c >> 150, 189 - bedtime:  185, 190 >> 129, 147-187, 241 >> n/c >> 194-291 - nighttime: 64 >> 60 x1 >> 45 (see below) >> n/c Low sugar: 50 - 4 mo ago, at night - did not eat much then >> 104; has hypoglycemia awareness in the 60s. Highest sugar was 291 >> 215 >> 260 (kidney stone) >> 368.  He has a ReliOn meter.  Pt's meals are: - Breakfast: grits or sandwich - Lunch: sandwich - Dinner: chicken or beef or pork + vegetables - Snacks: 1 or 2 snacks  - crackers, peanuts or cookies  -No CKD, last BUN/creatinine:  Lab Results  Component Value Date   BUN 21 02/24/2019   CREATININE 0.96 02/24/2019  On lisinopril.  -+ HL; last set of lipids: Lab Results  Component Value Date   CHOL 179 02/24/2019   HDL 34.70 (L) 02/24/2019   LDLCALC 75 02/12/2017   LDLDIRECT 100.0 02/24/2019   TRIG 245.0 (H) 02/24/2019   CHOLHDL 5 02/24/2019  On Lipitor 40 every other day and fish oil 2 capsules twice a day  -  Reviewed latest eye exam report: 11/2017: + DR. He was seeing Dr Ambrose Mantle [facility] - retired.  He will see Dr. Drucilla Schmidt -He denies numbness and tingling in his feet.  He also has a history of HTN, h/o nephrolithiasis x 3, last in 03/2013, OSA.   He had an MI 07/2014 >> had a stent placed. Cardiologist: Dr Sedonia Small.  He is on supplementation with vitamin B12.  At last visit, I felt a left thyroid nodule on palpation.  Thyroid U/S (01/22/2019): Several nodules, with 2 dominant ones:  Narrative &  Impression    Parenchymal Echotexture: Moderately heterogenous Isthmus: 1.0 cm Right lobe: 5.4 x 2.0 x 1.8 cm Left lobe: 4.7 x 2.4 x 2.0 cm _____________________________________  Estimated total number of nodules >/= 1 cm: 5 _____________________________________  Nodule # 1: Location: Isthmus; Mid Maximum size: 1.9 cm; Other 2 dimensions: 1.3 x 1.7 cm Composition: cannot determine (2) Echogenicity: hypoechoic (2) Echogenic foci: macrocalcifications (1) ACR TI-RADS total points: 5. **Given size (>/= 1.5 cm) and appearance, fine needle aspiration of this moderately suspicious nodule should be considered based on TI-RADS criteria. _________________________________________________________  Nodule # 2: Location: Right; Superior Maximum size: 1.0 cm; Other 2 dimensions: 0.6 x 0.7 cm Composition: solid/almost completely solid (2) Echogenicity: hypoechoic (2) Echogenic foci: punctate echogenic foci (3) ACR TI-RADS total points: 7. **Given size (>/= 1.0 cm) and appearance, fine needle aspiration of this highly suspicious nodule should be considered based on TI-RADS criteria. _________________________________________________________  Nodule #3 is a predominantly cystic nodule the mid right thyroid lobe that measures 1.1 x 0.8 x 0.8 cm.  Nodule #4 is a cystic nodule in the superior left thyroid lobe that measures 1.5 x 0.9 x 1.0 cm.  Nodule #5 is a mildly complex cystic nodule with septations in the mid left thyroid lobe that measures 1.2 x 1.0 x 1.3 cm.  Nodule # 6: Location: Left; Mid Maximum size: 0.8 cm; Other 2 dimensions: 0.6 x 0.5 cm Composition: mixed cystic and solid (1) Echogenicity: isoechoic (1) Shape: taller-than-wide (3) ACR TI-RADS total points: 5. Given size (<0.9 cm) and appearance, this nodule does NOT meet TI-RADS criteria for biopsy or dedicated follow-up. _______________________________________  IMPRESSION: 1. Multinodular goiter. 2. Nodule  #1 in the isthmus and nodule #2 in the superior right thyroid lobe both meet criteria for ultrasound-guided biopsy. 3. Multiple cystic nodules that do not meet criteria for biopsy.  The above is in keeping with the ACR TI-RADS recommendations - J Am Coll Radiol 2017;14:587-595.  Electronically Signed   By: Richarda Overlie M.D.   On: 01/22/2019 17:11    Biopsies of the dominant nodules (02/13/2019): Benign:  CYTOLOGY - NON PAP  CASE: [id number]  PATIENT: Jermel Shipman  Non-Gynecological Cytology Report   Clinical History: Nodule 1 Isthmus Mid 1.9 cm; Other 2 dimensions: 1.3 x  1.7 cm, Hypoechoic, ACR TI-RADS total points: 5, Moderately suspicious  nodule  Specimen Submitted: A. THYROID ISTHMUS, FINE NEEDLE ASPIRATION:   FINAL MICROSCOPIC DIAGNOSIS:  - Consistent with benign follicular nodule (Bethesda category II)   SPECIMEN ADEQUACY:  Satisfactory for evaluation   CYTOLOGY - NON PAP  CASE: [id number]  PATIENT: Garey Lakatos  Non-Gynecological Cytology Report   Clinical History: Nodule 2 Right Superior 1.0 cm; Other 2 dimensions:  0.6 x 0.7 cm, Solid almost completely solid, Hypoechoic, ACR TI-RADS  total points: 7, Highly suspicious nodule  Specimen Submitted: A. THYROID, RT LOBE RUP, FINE NEEDLE ASPIRATION:   FINAL MICROSCOPIC DIAGNOSIS:  - Consistent with benign  follicular nodule (Bethesda category II)   SPECIMEN ADEQUACY:  Satisfactory for evaluation  Pt denies: - feeling nodules in neck - hoarseness - dysphagia - choking - SOB with lying down  TSH was normal: Lab Results  Component Value Date   TSH 0.90 01/10/2019    ROS: Constitutional: no weight gain/no weight loss, no fatigue, no subjective hyperthermia, no subjective hypothermia Eyes: no blurry vision, no xerophthalmia ENT: no sore throat, + see HPI Cardiovascular: no CP/no SOB/no palpitations/no leg swelling Respiratory: no cough/no SOB/no wheezing Gastrointestinal: no N/no V/no D/no C/no  acid reflux Musculoskeletal: no muscle aches/no joint aches Skin: no rashes, no hair loss Neurological: no tremors/no numbness/no tingling/no dizziness  I reviewed pt's medications, allergies, PMH, social hx, family hx, and changes were documented in the history of present illness. Otherwise, unchanged from my initial visit note.  Past Medical History:  Diagnosis Date  . Allergy   . Diabetes mellitus   . Hyperlipidemia   . Hypertension   . Myocardial infarction [facility])    May 2016  . Nephrolithiasis    3x 2007-2015, passed on his own  . Sleep apnea    wears CPAP   Past Surgical History:  Procedure Laterality Date  . CARDIAC CATHETERIZATION     stent May 2016  . COLONOSCOPY    . heart stent     May 2016  . HERNIA REPAIR     Social History   Socioeconomic History  . Marital status: Married    Spouse name: Not on file  . Number of children: Not on file  . Years of education: Not on file  . Highest education level: Not on file  Occupational History  . Not on file  Tobacco Use  . Smoking status: Never Smoker  . Smokeless tobacco: Never Used  Substance and Sexual Activity  . Alcohol use: No  . Drug use: No  . Sexual activity: Never  Other Topics Concern  . Not on file  Social History Narrative   Married. 2 children son and daughter. Granddaughter 12/02/2014 born and grandson 7/17//2018.       Works Administrator, sports: golfing (low to mid 61s)   Social Determinants of Corporate investment banker Strain:   . Difficulty of Paying Living Expenses:   Food Insecurity:   . Worried About Programme researcher, broadcasting/film/video in the Last Year:   . Barista in the Last Year:   Transportation Needs:   . Freight forwarder (Medical):   Marland Kitchen Lack of Transportation (Non-Medical):   Physical Activity:   . Days of Exercise per Week:   . Minutes of Exercise per Session:   Stress:   . Feeling of Stress :   Social Connections:   . Frequency of  Communication with Friends and Family:   . Frequency of Social Gatherings with Friends and Family:   . Attends Religious Services:   . Active Member of Clubs or Organizations:   . Attends Banker Meetings:   Marland Kitchen Marital Status:   Intimate Partner Violence:   . Fear of Current or Ex-Partner:   . Emotionally Abused:   Marland Kitchen Physically Abused:   . Sexually Abused:    Current Outpatient Medications on File Prior to Visit  Medication Sig Dispense Refill  . aspirin 81 MG tablet Take 81 mg by mouth daily.     Marland Kitchen atorvastatin (LIPITOR) 40 MG tablet Take 1 tablet (40 mg total)  by mouth every other day. 46 tablet 3  . carvedilol (COREG) 3.125 MG tablet Take 1 tablet (3.125 mg total) by mouth 2 (two) times daily. 180 tablet 3  . empagliflozin (JARDIANCE) 25 MG TABS tablet Take 25 mg by mouth daily. 90 tablet 2  . EQ FIBER SUPPLEMENT PO Take by mouth.    . fluticasone (FLONASE) 50 MCG/ACT nasal spray Place into both nostrils daily as needed for allergies or rhinitis.    Marland Kitchen glucose blood test strip 1 each by Other route as needed. Use as instructed     . Insulin Glargine, 2 Unit Dial, (TOUJEO MAX SOLOSTAR) 300 UNIT/ML SOPN Inject 80 Units into the skin daily. 24 mL 3  . insulin lispro (HUMALOG KWIKPEN) 100 UNIT/ML KwikPen INJECT 28-48 UNITS SUBCUTANEOUSLY THREE TIMES DAILY 30 mL 4  . Insulin Pen Needle (CLICKFINE PEN NEEDLES) 32G X 4 MM MISC Use 3x a day 200 each 11  . lisinopril (ZESTRIL) 40 MG tablet Take 1 tablet (40 mg total) by mouth daily. 90 tablet 3  . metFORMIN (GLUCOPHAGE) 1000 MG tablet Take 1 tablet (1,000 mg total) by mouth 2 (two) times daily with a meal. 180 tablet 3  . Omega-3 Fatty Acids (FISH OIL) 1000 MG CAPS Take 4 capsules by mouth daily.     No current facility-administered medications on file prior to visit.   Allergies  Allergen Reactions  . Invokana [Canagliflozin] Other (See Comments)    Yeast infection   Family History  Problem Relation Age of Onset  . Coronary  artery disease Father   . Liver disease Father        from heart meds per pt  . Diabetes Sister        grandmother  . Kidney disease Sister        dialysis from dm  . Diabetes Sister   . Other Sister        flash pulmonary edema led to death  . Colon cancer Neg Hx   . Esophageal cancer Neg Hx   . Rectal cancer Neg Hx   . Stomach cancer Neg Hx     PE: BP (!) 142/80   Pulse 81   Ht 5\' 10"  (1.778 m)   Wt 252 lb (114.3 kg)   SpO2 96%   BMI 36.16 kg/m  Body mass index is 36.16 kg/m. Wt Readings from Last 3 Encounters:  06/24/19 252 lb (114.3 kg)  02/24/19 247 lb (112 kg)  01/10/19 250 lb (113.4 kg)   Constitutional: overweight, in NAD Eyes: PERRLA, EOMI, no exophthalmos ENT: moist mucous membranes, L thyroid nodule felt on palpation, no cervical lymphadenopathy Cardiovascular: RRR, No MRG Respiratory: CTA B Gastrointestinal: abdomen soft, NT, ND, BS+ Musculoskeletal: no deformities, strength intact in all 4 Skin: moist, warm, no rashes Neurological: no tremor with outstretched hands, DTR normal in all 4   ASSESSMENT: 1. DM2, insulin-dependent, controlled, with complications - CAD, s/p AMi - DR  2. HL  3.  Obesity  4.  Thyroid nodules  PLAN:  1. Patient with longstanding, fairly well-controlled diabetes, on a complex medication regimen including oral medications with Metformin and SGLT2 inhibitor and basal-bolus insulin regimen.  At last visit, he was not checking sugars consistently, only checking in the morning, we discussed that this is not enough and I strongly advised him to check sugars later in the day, also.  HbA1c was better, at 7.6% and fructosamine level was also better, at 6.35%.  He did not have significant lows.  We did not change his regimen at that time. -At this visit, sugars are higher after he relaxed his diet.  He is also drinking coffee with creamer and after this, his sugars increase significantly, to the 200s.  I strongly advised him to stop the  creamer and maybe use almond milk instead, if he cannot drink the coffee black. -We also discussed about adding a GLP-1 receptor agonist.  He does not have a history of pancreatitis or family history of medullary thyroid cancer.  Discussed about possible side effects.  We will start at a low dose and increase this as tolerated.  I advised him to decrease the dose of Humalog if his sugars start to improve after the starting Ozempic -He brings a blood sugar log however, he does not have any log regarding he is on usual blood sugars and I advised him to start writing them down. -I also encouraged him to continue his diet and reduce fatty foods and also sweets - I suggested to:  Patient Instructions  Please continue: - Metformin 1000 mg 2x a day. - Jardiance 25 mg daily before breakfast  - Toujeo 80 units daily - Humalog: 24 units before breakfast 36 units before lunch  44-48 units before dinner   Please start: - Ozempic 0.25 mg weekly in a.m. (for example on Sunday morning) x 4 weeks, then increase to 0.5 mg weekly in a.m. if no nausea or hypoglycemia.  You may need to decrease Humalog if sugars improve on Ozempic.  Please write down comments about the abnormal blood sugars.  Please stop creamer - you can use almond milk instead.  Continue to work on M.D.C. Holdingsyour diet. You may use higher Humalog doses before B and D for larger meal.  Please return in 3-4 months with your sugar log.   - we checked his HbA1c: 8.6% (higher) - advised to check sugars at different times of the day - 3x a day, rotating check times - advised for yearly eye exams >> he is UTD - return to clinic in 4 months   2. HL -Reviewed latest lipid panel from 01/2019: LDL higher, above goal of less than 70, triglycerides also high, HDL low Lab Results  Component Value Date   CHOL 179 02/24/2019   HDL 34.70 (L) 02/24/2019   LDLCALC 75 02/12/2017   LDLDIRECT 100.0 02/24/2019   TRIG 245.0 (H) 02/24/2019   CHOLHDL 5  02/24/2019  -Continues Lipitor every other day and fish oil twice a day without side effects  3.  Obesity -Continue Jardiance and will also add Ozempic which should also help with weight loss -He did well before the last 2 visits, losing 4 to 6 pounds before each -Gained 5 pounds since last  4.  Thyroid nodules -Felt on palpation -He denies neck compression symptoms -Latest TSH reviewed and this was normal Lab Results  Component Value Date   TSH 0.90 01/10/2019  -Reviewed the results of his thyroid ultrasound obtained after last visit: He has 2 dominant nodules, which were biopsied with benign results -We will continue to follow him clinically and repeat the ultrasound a year from now  Carlus Pavlovristina Gedalya Jim, MD PhD [facility]

## 2019-07-31 ENCOUNTER — Telehealth: Payer: Self-pay | Admitting: Internal Medicine

## 2019-07-31 MED ORDER — NOVOLOG FLEXPEN 100 UNIT/ML ~~LOC~~ SOPN: 28.0000 [IU] | PEN_INJECTOR | Freq: Three times a day (TID) | SUBCUTANEOUS | 11 refills | Status: DC

## 2019-07-31 NOTE — Telephone Encounter (Signed)
New RX for Novolog sent

## 2019-07-31 NOTE — Telephone Encounter (Signed)
Patient called stating his insurance no longer covers humalog and requests an RX for a 30day supply for novolog flex pen (tier 3?)  [facility] - [place], [place] - [address] Phone:  [phone]  Fax:  [phone]

## 2019-08-19 NOTE — Patient Instructions (Addendum)
Health Maintenance Due  Topic Date Due  . COVID-19 Vaccine (1) will call with dates  Never done  . OPHTHALMOLOGY EXAM will make app 08/15/2018   Please stop by lab before you go If you have mychart- we will send your results within 3 business days of [place] receiving them.  If you do not have mychart- we will call you about results within 5 business days of [place] receiving them.     Fatigue: you may want to try melatonin. Start with the 1-3 mg. If no improvement give our office a call. Continue to use your Cpap.   Diabetes  Make sure you make your appointment for eye exam.    Essential hypertension looks amazing today. Keep up the good work.   Cholesterol: we changed your medications at last visit. We are going to check your LDL direct today. We will call with results.    Congratulations on 4 lb weight loss. Continue increasing exercise and portion control.   Call for follow up with Dr. Molly Maduro.   We have made appointment for your next Physical in 6 months. If you have any problems give our office a call.

## 2019-08-19 NOTE — Progress Notes (Addendum)
Phone [phone] In person visit   Subjective:   Zachary Warner is a 64 y.o. year old very pleasant male patient who presents for/with See problem oriented charting Chief Complaint  Patient presents with  . Hyperlipidemia  . Diabetes  . Hypertension    This visit occurred during the SARS-CoV-2 public health emergency.  Safety protocols were in place, including screening questions prior to the visit, additional usage of staff PPE, and extensive cleaning of exam room while observing appropriate contact time as indicated for disinfecting solutions.   Past Medical History-  Patient Active Problem List   Diagnosis Date Noted  . Diabetic retinopathy (HCC) 02/12/2017    Priority: High  . CAD (coronary artery disease) 02/15/2015    Priority: High  . Type 2 diabetes mellitus with circulatory disorder, with long-term current use of insulin (HCC) 02/04/2006    Priority: High  . Hyperlipidemia 02/04/2006    Priority: Medium  . Essential hypertension 02/04/2006    Priority: Medium  . Morbid obesity (HCC) 08/13/2017    Priority: Low  . Nephrolithiasis     Priority: Low  . 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 RECTAL FISSURE 06/05/2006    Priority: Low  . OSA on CPAP 08/26/2019    Medications- reviewed and updated Current Outpatient Medications  Medication Sig Dispense Refill  . aspirin 81 MG tablet Take 81 mg by mouth daily.     Marland Kitchen atorvastatin (LIPITOR) 40 MG tablet Take 1 tablet (40 mg total) by mouth every other day. 46 tablet 3  . carvedilol (COREG) 3.125 MG tablet Take 1 tablet (3.125 mg total) by mouth 2 (two) times daily. 180 tablet 3  . empagliflozin (JARDIANCE) 25 MG TABS tablet Take 25 mg by mouth daily. 90 tablet 2  . EQ FIBER SUPPLEMENT PO Take by mouth.    Marland Kitchen glucose blood test strip 1 each by Other route as needed. Use as instructed     . insulin aspart (NOVOLOG FLEXPEN) 100 UNIT/ML FlexPen Inject 28-48 Units into the skin 3 (three) times daily with meals.  15 pen 11  . Insulin Glargine, 2 Unit Dial, (TOUJEO MAX SOLOSTAR) 300 UNIT/ML SOPN Inject 80 Units into the skin daily. 24 mL 3  . Insulin Pen Needle (CLICKFINE PEN NEEDLES) 32G X 4 MM MISC Use 3x a day 200 each 11  . lisinopril (ZESTRIL) 40 MG tablet Take 1 tablet (40 mg total) by mouth daily. 90 tablet 3  . metFORMIN (GLUCOPHAGE) 1000 MG tablet Take 1 tablet (1,000 mg total) by mouth 2 (two) times daily with a meal. 180 tablet 3  . Omega-3 Fatty Acids (FISH OIL) 1000 MG CAPS Take 4 capsules by mouth daily.    . Semaglutide,0.25 or 0.5MG /DOS, (OZEMPIC, 0.25 OR 0.5 MG/DOSE,) 2 MG/1.5ML SOPN Inject 0.5 mg into the skin once a week. 2 pen 5  . fluticasone (FLONASE) 50 MCG/ACT nasal spray Place into both nostrils daily as needed for allergies or rhinitis.     No current facility-administered medications for this visit.     Objective:  BP 122/78   Pulse 88   Temp 98.3 F (36.8 C) (Temporal)   Ht 5\' 10"  (1.778 m)   Wt 248 lb (112.5 kg)   SpO2 97%   BMI 35.58 kg/m  Gen: NAD, resting comfortably CV: RRR no murmurs rubs or gallops Lungs: CTAB no crackles, wheeze, rhonchi Abdomen: soft/nontender/nondistended/normal bowel sounds.  Ext: no edema Skin: warm, dry    Assessment and Plan   #  hypertension S: compliant with coreg 3.125mg  BID, lisinopril 40mg   BP Readings from Last 3 Encounters:  08/26/19 122/78  06/24/19 (!) 142/80  02/24/19 136/78  A/P: Stable. Continue current medications.   #hyperlipidemia S: previously mild poorly controlled on atorvastatin 40mg  every other day with LDL above 70.  On 02/24/2019 we increased dose to 40 mg daily  Lab Results  Component Value Date   CHOL 179 02/24/2019   HDL 34.70 (L) 02/24/2019   LDLCALC 75 02/12/2017   LDLDIRECT 100.0 02/24/2019   TRIG 245.0 (H) 02/24/2019   CHOLHDL 5 02/24/2019    A/P: Hopefully improved control with increase in atorvastatin to 40 mg daily from every other day-update direct LDL today   # morbid obesity with BMI  over 35 with htn, hld, dm  S: Goal by follow up late 2020 was weight at 240 or below. Has cut down on portion size and getting to be a little more active- wants to increase further Wt Readings from Last 3 Encounters:  08/26/19 248 lb (112.5 kg)  06/24/19 252 lb (114.3 kg)  02/24/19 247 lb (112 kg)   A/P: Congratulated on progress-encouraged him to continue efforts for lower portion size, regular exercise.   %# Diabetes with history of diabetic retinopathy S: followed closely by Dr. Cruzita Lederer. Fructosamine more relevant for him-  Patient compliant with basal insulin  for basal with 80  unitsper day, lispro BID at least 28 units.  He is also now on Ozempic orally on jardiance 25mg , metformin 1000mg  BID.   Has history retinopathy- our last diabetic eye exam on file is over a year ago-patient reports he needs to call to get back in  A/P: Diabetes-mild poor control at March visit-hopefully improving with recent adjustments- sees Dr. Cruzita Lederer in august  For diabetic retinopathy-need to continue to work on diabetes control and he agrees to call to schedule follow-up eye exam   %# CAD- follows with Dr. Warden Fillers cardiology- has been over a year S:compliant with aspirin and statin. Asymptomatic with no chest pain or shortness of breath . Saw cardiology last over a year ago A/P: Coronary artery disease remains asymptomatic.  He is compliant with aspirin and statin.  It has been over a year since last cardiology visit due to pandemic-he is going to call and schedule follow-up  #fatigue/poor sleep-patient reports some fatigue recently-not getting a great night's rest even with his CPAP machine. He has tried zquill but doesn't feel well the next day.  -we will trial low dose melatonin 1-3 mg-he has tried melatonin in the past and it actually made him more alert-he will stop if this is not effective-I wonder if he previously tried a higher dose -declines more extensive lab testing  # final shingrix  today Immunization History  Administered Date(s) Administered  . Influenza Split 04/17/2011  . Influenza Whole 01/02/2007, 01/07/2009, 02/04/2010, 01/06/2012  . Influenza, Quadrivalent, Recombinant, Inj, Pf 01/04/2019  . Influenza,inj,Quad PF,6+ Mos 03/14/2013, 02/03/2014, 01/06/2015, 11/28/2017  . Influenza-Unspecified 12/25/2012, 01/28/2016, 01/13/2017  . Pneumococcal Conjugate-13 07/15/2013  . Pneumococcal Polysaccharide-23 05/11/2008, 08/19/2014  . Tdap 07/15/2013  . Zoster Recombinat (Shingrix) 02/24/2019   Recommended follow up: Return in about 6 months (around 02/25/2020) for physical or sooner if needed. Future Appointments  Date Time Provider [place]  11/10/2019  8:00 AM Philemon Kingdom, MD [id number] None    Lab/Order associations:   ICD-10-CM   1. Type 2 diabetes mellitus with other circulatory complication, with long-term current use of insulin (HCC)  E11.59  Direct LDL   Z79.4 Comprehensive metabolic panel  2. Essential hypertension  I10   3. Hyperlipidemia, unspecified hyperlipidemia type  E78.5 Direct LDL    Comprehensive metabolic panel  4. Morbid obesity (HCC)  E66.01   5. Coronary artery disease involving native coronary artery of native heart without angina pectoris  I25.10   6. Mild nonproliferative diabetic retinopathy without macular edema associated with type 2 diabetes mellitus, unspecified laterality (HCC)  E11.3299   7. OSA on CPAP  G47.33    Z99.89     No orders of the defined types were placed in this encounter.   Return precautions advised.  [name], MD

## 2019-08-26 ENCOUNTER — Encounter: Payer: Self-pay | Admitting: Family Medicine

## 2019-08-26 ENCOUNTER — Ambulatory Visit (INDEPENDENT_AMBULATORY_CARE_PROVIDER_SITE_OTHER): Payer: BC Managed Care – PPO | Admitting: Family Medicine

## 2019-08-26 ENCOUNTER — Other Ambulatory Visit: Payer: Self-pay

## 2019-08-26 VITALS — BP 122/78 | HR 88 | Temp 98.3°F | Ht 70.0 in | Wt 248.0 lb

## 2019-08-26 DIAGNOSIS — Z9989 Dependence on other enabling machines and devices: Secondary | ICD-10-CM

## 2019-08-26 DIAGNOSIS — Z23 Encounter for immunization: Secondary | ICD-10-CM

## 2019-08-26 DIAGNOSIS — E1159 Type 2 diabetes mellitus with other circulatory complications: Secondary | ICD-10-CM

## 2019-08-26 DIAGNOSIS — G4730 Sleep apnea, unspecified: Secondary | ICD-10-CM | POA: Insufficient documentation

## 2019-08-26 DIAGNOSIS — Z794 Long term (current) use of insulin: Secondary | ICD-10-CM | POA: Diagnosis not present

## 2019-08-26 DIAGNOSIS — G4733 Obstructive sleep apnea (adult) (pediatric): Secondary | ICD-10-CM

## 2019-08-26 DIAGNOSIS — E113299 Type 2 diabetes mellitus with mild nonproliferative diabetic retinopathy without macular edema, unspecified eye: Secondary | ICD-10-CM

## 2019-08-26 DIAGNOSIS — E785 Hyperlipidemia, unspecified: Secondary | ICD-10-CM

## 2019-08-26 DIAGNOSIS — I2510 Atherosclerotic heart disease of native coronary artery without angina pectoris: Secondary | ICD-10-CM

## 2019-08-26 DIAGNOSIS — I10 Essential (primary) hypertension: Secondary | ICD-10-CM

## 2019-08-26 LAB — COMPREHENSIVE METABOLIC PANEL
ALT: 20 U/L (ref 0–53)
AST: 14 U/L (ref 0–37)
Albumin: 4.6 g/dL (ref 3.5–5.2)
Alkaline Phosphatase: 54 U/L (ref 39–117)
BUN: 22 mg/dL (ref 6–23)
CO2: 24 mEq/L (ref 19–32)
Calcium: 9.9 mg/dL (ref 8.4–10.5)
Chloride: 102 mEq/L (ref 96–112)
Creatinine, Ser: 0.92 mg/dL (ref 0.40–1.50)
GFR: 82.81 mL/min (ref >60.00)
Glucose, Bld: 130 mg/dL — ABNORMAL HIGH (ref 70–99)
Potassium: 4.7 mEq/L (ref 3.5–5.1)
Sodium: 138 mEq/L (ref 135–145)
Total Bilirubin: 0.8 mg/dL (ref 0.2–1.2)
Total Protein: 7.2 g/dL (ref 6.0–8.3)

## 2019-08-26 LAB — LDL CHOLESTEROL, DIRECT: Direct LDL: 59 mg/dL

## 2019-08-26 NOTE — Addendum Note (Signed)
Addended by: Donnamarie Poag on: 08/26/2019 08:42 AM   Modules accepted: Orders

## 2019-11-10 ENCOUNTER — Ambulatory Visit: Payer: BC Managed Care – PPO | Admitting: Internal Medicine

## 2019-11-10 ENCOUNTER — Other Ambulatory Visit: Payer: Self-pay

## 2019-11-10 MED ORDER — TOUJEO MAX SOLOSTAR 300 UNIT/ML ~~LOC~~ SOPN: 80.0000 [IU] | PEN_INJECTOR | Freq: Every day | SUBCUTANEOUS | 3 refills | Status: DC

## 2019-11-25 ENCOUNTER — Other Ambulatory Visit: Payer: Self-pay

## 2019-11-25 ENCOUNTER — Encounter: Payer: Self-pay | Admitting: Internal Medicine

## 2019-11-25 ENCOUNTER — Ambulatory Visit: Payer: BC Managed Care – PPO | Admitting: Internal Medicine

## 2019-11-25 VITALS — BP 120/70 | HR 86 | Ht 70.0 in | Wt 247.0 lb

## 2019-11-25 DIAGNOSIS — E1159 Type 2 diabetes mellitus with other circulatory complications: Secondary | ICD-10-CM | POA: Diagnosis not present

## 2019-11-25 DIAGNOSIS — Z794 Long term (current) use of insulin: Secondary | ICD-10-CM

## 2019-11-25 DIAGNOSIS — E042 Nontoxic multinodular goiter: Secondary | ICD-10-CM | POA: Diagnosis not present

## 2019-11-25 DIAGNOSIS — E785 Hyperlipidemia, unspecified: Secondary | ICD-10-CM

## 2019-11-25 MED ORDER — OZEMPIC (1 MG/DOSE) 4 MG/3ML ~~LOC~~ SOPN
1.0000 mg | PEN_INJECTOR | SUBCUTANEOUS | 3 refills | Status: DC
Start: 2019-11-25 — End: 2020-03-31

## 2019-11-25 NOTE — Progress Notes (Addendum)
Patient ID: Zachary Warner, male   DOB: [birth year], 64 y.o.   MRN: [id number]  This visit occurred during the SARS-CoV-2 public health emergency.  Safety protocols were in place, including screening questions prior to the visit, additional usage of staff PPE, and extensive cleaning of exam room while observing appropriate contact time as indicated for disinfecting solutions.   HPI: Zachary Warner is a 64 y.o.-year-old male, presenting for f/u for DM2, dx in ~1995, insulin-dependent since ~2005, uncontrolled, with complications (CAD - s/p AMI, DR). Last visit 5 months ago.  At last visit sugars were higher as he relaxed his diet and he was less active.  His mother fell and fractured neck and hip in 08/2019 >> he was very busy.  Reviewed HbA1c levels: Lab Results  Component Value Date   HGBA1C 8.6 (A) 06/24/2019   HGBA1C 7.6 (A) 01/10/2019   HGBA1C 8.1 (A) 02/18/2018  01/10/2019: HbA1c calculated from fructosamine 6.35% 04/09/2017: HbA1c calculated from fructosamine is 6.8% 11/28/2017:  Hba1c calculated from fructosamine is better: 6.45% 07/11/2017: HbA1c calculated from the fructosamine is 6.6%. 03/08/2017: HbA1c calculated from the fructosamine is 6.5%. 11/06/2016: HbA1c calculated from the fructosamine is slightly higher, at 6.6% 07/05/2016: HbA1c calculated from the fructosamine is 6.2% 04/05/2016: HbA1c calculated from fructosamine is excellent, at 6% 01/04/2016: HbA1c calculated from fructosamine is  6.55%. 11/10/2015: HbA1c calculated from fructosamine is 5.8%. 08/10/2015: HbA1c calculated from fructosamine is 7.05%. 04/09/2015: HbA1c calculated fructosamine (326): 7.1%, which is much more concordant with his sugar log.   He is on: - Metformin 1000 mg 2x a day. - Jardiance 25 mg daily before breakfast -he initially had yeast infections, now resolved - Ozempic 0.5 mg weekly-added 07/2019 - Evaristo Bury U200  >> Toujeo 80 units daily - Humalog: 24 units before breakfast 36 units before  lunch (increased 07/2018) 44-48 units before dinner  We stopped Januvia 2/2 large doses of mealtime insulin.  He checks his sugars 0 to once a day: - am: 100-120, occas.180 >> <100 >> 120-175 >> 98-154, 163 - 2h after b'fast: 189-251 (coffee and creamer) >> 244 x1 - lunch:  166-199, 201, 262 >> n/c >> 104-195 >> 126-160 - 2h after lunch:   215 >> n/c >> 140-240, 368 >> 130-189, 240 - dinner:  215-230 >> n/c >> 137-207, 325 >> 120-198 - 2h after dinner:   212 >> n/c >> 150, 189 >> 148-189, 300 - bedtime: 129, 147-187, 241 >> n/c >> 194-291 >> 133-190 - nighttime: 64 >> 60 x1 >> 45 (see below) >> n/c >> 200 Low sugar: 50 - 4 mo ago, at night - did not eat much then >> 104 >> 98; has hypoglycemia awareness in the 60s. Highest sugar was 291 >> 215 >> 260 (kidney stone) >> 368 >> 300.  He has a ReliOn meter.  Pt's meals are: - Breakfast: grits or sandwich - Lunch: sandwich - Dinner: chicken or beef or pork + vegetables - Snacks: 1 or 2 snacks  - crackers, peanuts or cookies  -No CKD, last BUN/creatinine:  Lab Results  Component Value Date   BUN 22 08/26/2019   CREATININE 0.92 08/26/2019  On lisinopril.  -+ HL; last set of lipids: Lab Results  Component Value Date   CHOL 179 02/24/2019   HDL 34.70 (L) 02/24/2019   LDLCALC 75 02/12/2017   LDLDIRECT 59.0 08/26/2019   TRIG 245.0 (H) 02/24/2019   CHOLHDL 5 02/24/2019  On Lipitor 40 every day and fish oil 2 capsules twice a day  -  Reviewed latest eye exam report: 11/2017: + DR. [facility] - [facility].  He will see Dr. Drucilla SchmidtHeadley - No numbness and tingling in his feet.  He also has a history of HTN, h/o nephrolithiasis x 3, last in 03/2013, OSA.   He had an MI 07/2014 >> had a stent placed. Cardiologist: Dr Sedonia SmallKrosowski. He is on supplementation with B12.  She also has a history of thyroid nodules:  Thyroid U/S (01/22/2019): Several nodules with 2 dominant ones:  Narrative & Impression     Parenchymal Echotexture: Moderately heterogenous Isthmus: 1.0 cm Right lobe: 5.4 x 2.0 x 1.8 cm Left lobe: 4.7 x 2.4 x 2.0 cm _____________________________________  Estimated total number of nodules >/= 1 cm: 5 _____________________________________  Nodule # 1: Location: Isthmus; Mid Maximum size: 1.9 cm; Other 2 dimensions: 1.3 x 1.7 cm Composition: cannot determine (2) Echogenicity: hypoechoic (2) Echogenic foci: macrocalcifications (1) ACR TI-RADS total points: 5. **Given size (>/= 1.5 cm) and appearance, fine needle aspiration of this moderately suspicious nodule should be considered based on TI-RADS criteria. _________________________________________________________  Nodule # 2: Location: Right; Superior Maximum size: 1.0 cm; Other 2 dimensions: 0.6 x 0.7 cm Composition: solid/almost completely solid (2) Echogenicity: hypoechoic (2) Echogenic foci: punctate echogenic foci (3) ACR TI-RADS total points: 7. **Given size (>/= 1.0 cm) and appearance, fine needle aspiration of this highly suspicious nodule should be considered based on TI-RADS criteria. _________________________________________________________  Nodule #3 is a predominantly cystic nodule the mid right thyroid lobe that measures 1.1 x 0.8 x 0.8 cm.  Nodule #4 is a cystic nodule in the superior left thyroid lobe that measures 1.5 x 0.9 x 1.0 cm.  Nodule #5 is a mildly complex cystic nodule with septations in the mid left thyroid lobe that measures 1.2 x 1.0 x 1.3 cm.  Nodule # 6: Location: Left; Mid Maximum size: 0.8 cm; Other 2 dimensions: 0.6 x 0.5 cm Composition: mixed cystic and solid (1) Echogenicity: isoechoic (1) Shape: taller-than-wide (3) ACR TI-RADS total points: 5. Given size (<0.9 cm) and appearance, this nodule does NOT meet TI-RADS criteria for biopsy or dedicated follow-up. _______________________________________  IMPRESSION: 1. Multinodular goiter. 2. Nodule #1 in the  isthmus and nodule #2 in the superior right thyroid lobe both meet criteria for ultrasound-guided biopsy. 3. Multiple cystic nodules that do not meet criteria for biopsy.  The above is in keeping with the ACR TI-RADS recommendations - J Am Coll Radiol 2017;14:587-595.  Electronically Signed   By: Richarda OverlieAdam  Henn M.D.   On: 01/22/2019 17:11    Biopsies of the dominant nodules (02/13/2019): Benign:  Clinical History: Nodule 1 Isthmus Mid 1.9 cm; Other 2 dimensions: 1.3 x  1.7 cm, Hypoechoic, ACR TI-RADS total points: 5, Moderately suspicious  nodule  Specimen Submitted: A. THYROID ISTHMUS, FINE NEEDLE ASPIRATION:   FINAL MICROSCOPIC DIAGNOSIS:  - Consistent with benign follicular nodule (Bethesda category II)   SPECIMEN ADEQUACY:  Satisfactory for evaluation   CYTOLOGY - NON PAP  CASE: [id number]  PATIENT: Donnavin Christopher  Non-Gynecological Cytology Report   Clinical History: Nodule 2 Right Superior 1.0 cm; Other 2 dimensions:  0.6 x 0.7 cm, Solid almost completely solid, Hypoechoic, ACR TI-RADS  total points: 7, Highly suspicious nodule  Specimen Submitted: A. THYROID, RT LOBE RUP, FINE NEEDLE ASPIRATION:   FINAL MICROSCOPIC DIAGNOSIS:  - Consistent with benign follicular nodule (Bethesda category II)   SPECIMEN ADEQUACY:  Satisfactory for evaluation  Pt denies: - feeling nodules in neck -  hoarseness - dysphagia - choking - SOB with lying down  Latest TSH was normal: Lab Results  Component Value Date   TSH 0.90 01/10/2019   ROS: Constitutional: no weight gain/no weight loss, no fatigue, no subjective hyperthermia, no subjective hypothermia Eyes: no blurry vision, no xerophthalmia ENT: no sore throat, + see HPI Cardiovascular: no CP/no SOB/no palpitations/no leg swelling Respiratory: no cough/no SOB/no wheezing Gastrointestinal: no N/no V/no D/no C/no acid reflux Musculoskeletal: no muscle aches/no joint aches Skin: no rashes, no hair loss Neurological: no  tremors/no numbness/no tingling/no dizziness  I reviewed pt's medications, allergies, PMH, social hx, family hx, and changes were documented in the history of present illness. Otherwise, unchanged from my initial visit note.  Past Medical History:  Diagnosis Date  . Allergy   . Diabetes mellitus   . Hyperlipidemia   . Hypertension   . Myocardial infarction [facility])    May 2016  . Nephrolithiasis    3x 2007-2015, passed on his own  . Sleep apnea    wears CPAP   Past Surgical History:  Procedure Laterality Date  . CARDIAC CATHETERIZATION     stent May 2016  . COLONOSCOPY    . heart stent     May 2016  . HERNIA REPAIR     Social History   Socioeconomic History  . Marital status: Married    Spouse name: Not on file  . Number of children: Not on file  . Years of education: Not on file  . Highest education level: Not on file  Occupational History  . Not on file  Tobacco Use  . Smoking status: Never Smoker  . Smokeless tobacco: Never Used  Substance and Sexual Activity  . Alcohol use: No  . Drug use: No  . Sexual activity: Never  Other Topics Concern  . Not on file  Social History Narrative   Married. 2 children son and daughter. Granddaughter 12/02/2014 born and grandson 7/17//2018.       Works Administrator, sports: golfing (low to mid 39s)   Social Determinants of Corporate investment banker Strain:   . Difficulty of Paying Living Expenses: Not on file  Food Insecurity:   . Worried About Programme researcher, broadcasting/film/video in the Last Year: Not on file  . Ran Out of Food in the Last Year: Not on file  Transportation Needs:   . Lack of Transportation (Medical): Not on file  . Lack of Transportation (Non-Medical): Not on file  Physical Activity:   . Days of Exercise per Week: Not on file  . Minutes of Exercise per Session: Not on file  Stress:   . Feeling of Stress : Not on file  Social Connections:   . Frequency of Communication with Friends and  Family: Not on file  . Frequency of Social Gatherings with Friends and Family: Not on file  . Attends Religious Services: Not on file  . Active Member of Clubs or Organizations: Not on file  . Attends Banker Meetings: Not on file  . Marital Status: Not on file  Intimate Partner Violence:   . Fear of Current or Ex-Partner: Not on file  . Emotionally Abused: Not on file  . Physically Abused: Not on file  . Sexually Abused: Not on file   Current Outpatient Medications on File Prior to Visit  Medication Sig Dispense Refill  . aspirin 81 MG tablet Take 81 mg by mouth daily.     Marland Kitchen  atorvastatin (LIPITOR) 40 MG tablet Take 1 tablet (40 mg total) by mouth every other day. 46 tablet 3  . carvedilol (COREG) 3.125 MG tablet Take 1 tablet (3.125 mg total) by mouth 2 (two) times daily. 180 tablet 3  . empagliflozin (JARDIANCE) 25 MG TABS tablet Take 25 mg by mouth daily. 90 tablet 2  . EQ FIBER SUPPLEMENT PO Take by mouth.    . fluticasone (FLONASE) 50 MCG/ACT nasal spray Place into both nostrils daily as needed for allergies or rhinitis.    Marland Kitchen glucose blood test strip 1 each by Other route as needed. Use as instructed     . insulin aspart (NOVOLOG FLEXPEN) 100 UNIT/ML FlexPen Inject 28-48 Units into the skin 3 (three) times daily with meals. 15 pen 11  . insulin glargine, 2 Unit Dial, (TOUJEO MAX SOLOSTAR) 300 UNIT/ML Solostar Pen Inject 80 Units into the skin daily. 24 mL 3  . Insulin Pen Needle (CLICKFINE PEN NEEDLES) 32G X 4 MM MISC Use 3x a day 200 each 11  . lisinopril (ZESTRIL) 40 MG tablet Take 1 tablet (40 mg total) by mouth daily. 90 tablet 3  . metFORMIN (GLUCOPHAGE) 1000 MG tablet Take 1 tablet (1,000 mg total) by mouth 2 (two) times daily with a meal. 180 tablet 3  . Omega-3 Fatty Acids (FISH OIL) 1000 MG CAPS Take 4 capsules by mouth daily.    . Semaglutide,0.25 or 0.5MG /DOS, (OZEMPIC, 0.25 OR 0.5 MG/DOSE,) 2 MG/1.5ML SOPN Inject 0.5 mg into the skin once a week. 2 pen 5    No current facility-administered medications on file prior to visit.   Allergies  Allergen Reactions  . Invokana [Canagliflozin] Other (See Comments)    Yeast infection   Family History  Problem Relation Age of Onset  . Coronary artery disease Father   . Liver disease Father        from heart meds per pt  . Diabetes Sister        grandmother  . Kidney disease Sister        dialysis from dm  . Diabetes Sister   . Other Sister        flash pulmonary edema led to death  . Colon cancer Neg Hx   . Esophageal cancer Neg Hx   . Rectal cancer Neg Hx   . Stomach cancer Neg Hx     PE: BP 120/70   Pulse 86   Ht 5\' 10"  (1.778 m)   Wt 247 lb (112 kg)   SpO2 95%   BMI 35.44 kg/m  Body mass index is 35.44 kg/m. Wt Readings from Last 3 Encounters:  11/25/19 247 lb (112 kg)  08/26/19 248 lb (112.5 kg)  06/24/19 252 lb (114.3 kg)   Constitutional: overweight, in NAD Eyes: PERRLA, EOMI, no exophthalmos ENT: moist mucous membranes, no thyromegaly, + L thyroid nodule felt on palpation, no cervical lymphadenopathy Cardiovascular: RRR, No MRG Respiratory: CTA B Gastrointestinal: abdomen soft, NT, ND, BS+ Musculoskeletal: no deformities, strength intact in all 4 Skin: moist, warm, no rashes Neurological: no tremor with outstretched hands, DTR normal in all 4   ASSESSMENT: 1. DM2, insulin-dependent, controlled, with complications - CAD, s/p AMi - DR  He does not have a history of pancreatitis or family history of medullary thyroid cancer.    2. HL  3.  Obesity  4.  Thyroid nodules  PLAN:  1. Patient with longstanding, fairly well-controlled type 2 diabetes on a complex medication regimen with Metformin, SGLT2 inhibitor, basal-bolus  insulin regimen and also weekly GLP-1 receptor agonist added at last visit.  At that time sugars were higher after he relaxed his diet and was less active.  We discussed about the need to improve his diet and stay active but I also advised him to  stop creamer in his coffee and switch to almond milk.  Also, to write down comments about the abnormal blood sugars in his log.  At that time HbA1c was higher, at 8.6%, however HbA1c levels calculated from fructosamine are more accurate for him and we did not check one at last visit. -At this visit, sugars appear definitely improved compared to last visit, without lows, but he has hyperglycemic spikes most likely due to dietary indiscretions.  He had 1 blood sugar at 300 (he did not write comments about why this could have happened).  I again advised him to try writing down possible reasons for his elevated blood sugars in his log.  Overall, since sugars are still above target, I suggested to increase the dose of Ozempic, says he tolerates this well.  We will keep the rest of his regimen the same, but at next visit, I would like to start decreasing.  Insulin doses, if possible. - I suggested to:  Patient Instructions  Please continue: - Metformin 1000 mg 2x a day. - Jardiance 25 mg daily before breakfast  - Toujeo 80 units daily - Humalog: 24 units before breakfast 36 units before lunch  44-48 units before dinner   Please increase: - Ozempic to 1 mg weekly  Please write down comments about the abnormal blood sugars.  Please return in 3-4 months with your sugar log.   - we will check a fructosamine today - advised to check sugars at different times of the day - 3x a day, rotating check times - advised for yearly eye exams >> he is not UTD - return to clinic in 3-4 months  2. HL -Reviewed latest lipid panel from 01/2019: LDL at goal, triglycerides high, HDL low: Lab Results  Component Value Date   CHOL 179 02/24/2019   HDL 34.70 (L) 02/24/2019   LDLCALC 75 02/12/2017   LDLDIRECT 59.0 08/26/2019   TRIG 245.0 (H) 02/24/2019   CHOLHDL 5 02/24/2019  -Continues Lipitor every day now (previously every other day) and fish oil twice a day without side effects  3.  Obesity -continue SGLT 2  inhibitor and GLP-1 receptor agonist which should also help with weight loss -He gained 5 pounds before last visit and 5 more since then.  4.  Thyroid nodules -Felt on palpation -no neck compression symptoms -Latest TSH was normal Lab Results  Component Value Date   TSH 0.90 01/10/2019  -Reviewing the results of his latest ultrasound, he has 2 dominant nodules, which were biopsied with benign results -We will continue to follow him clinically for now and repeat the ultrasound at next visit  Component     Latest Ref Rng & Units 11/25/2019  Fructosamine     205 - 285 umol/L 270   HbA1c calculated from fructosamine is better than before at 6.2%.  However, this is not completely correlating with his sugars at home.  We will go ahead and increase the Ozempic dose, as discussed.  Carlus Pavlov, MD PhD [facility] Endocrinology

## 2019-11-25 NOTE — Patient Instructions (Addendum)
Please continue: - Metformin 1000 mg 2x a day. - Jardiance 25 mg daily before breakfast  - Toujeo 80 units daily - Humalog: 24 units before breakfast 36 units before lunch  44-48 units before dinner   Please increase: - Ozempic to 1 mg weekly  Please write down comments about the abnormal blood sugars.  Please return in 3-4 months with your sugar log.

## 2019-11-28 LAB — FRUCTOSAMINE: Fructosamine: 270 umol/L (ref 205–285)

## 2019-12-05 ENCOUNTER — Other Ambulatory Visit: Payer: Self-pay

## 2019-12-05 MED ORDER — EMPAGLIFLOZIN 25 MG PO TABS: 25.0000 mg | ORAL_TABLET | Freq: Every day | ORAL | 2 refills | Status: DC

## 2020-03-03 ENCOUNTER — Telehealth: Payer: Self-pay | Admitting: Internal Medicine

## 2020-03-03 ENCOUNTER — Other Ambulatory Visit: Payer: Self-pay

## 2020-03-03 ENCOUNTER — Encounter: Payer: Self-pay | Admitting: Family Medicine

## 2020-03-03 ENCOUNTER — Ambulatory Visit (INDEPENDENT_AMBULATORY_CARE_PROVIDER_SITE_OTHER): Payer: BC Managed Care – PPO | Admitting: Family Medicine

## 2020-03-03 VITALS — BP 130/76 | HR 92 | Temp 98.3°F | Ht 70.0 in | Wt 245.8 lb

## 2020-03-03 DIAGNOSIS — E1159 Type 2 diabetes mellitus with other circulatory complications: Secondary | ICD-10-CM

## 2020-03-03 DIAGNOSIS — I10 Essential (primary) hypertension: Secondary | ICD-10-CM

## 2020-03-03 DIAGNOSIS — Z0000 Encounter for general adult medical examination without abnormal findings: Secondary | ICD-10-CM | POA: Diagnosis not present

## 2020-03-03 DIAGNOSIS — Z23 Encounter for immunization: Secondary | ICD-10-CM

## 2020-03-03 DIAGNOSIS — Z125 Encounter for screening for malignant neoplasm of prostate: Secondary | ICD-10-CM

## 2020-03-03 DIAGNOSIS — Z794 Long term (current) use of insulin: Secondary | ICD-10-CM

## 2020-03-03 DIAGNOSIS — E785 Hyperlipidemia, unspecified: Secondary | ICD-10-CM

## 2020-03-03 NOTE — Patient Instructions (Addendum)
Health Maintenance Due  Topic Date Due  . OPHTHALMOLOGY EXAM -needs to schedule with new eye doctor in [place] 08/15/2018   Thanks for doing flu shot  Please call us with date after you received boosted.   Please stop by lab before you go If you have mychart- we will send your results within 3 business days of [place] receiving them.  If you do not have mychart- we will call you about results within 5 business days of [place] receiving them.  *please note we are currently using [facility] which has a longer processing time than [facility] typically so labs may not come back as quickly as in the past *please also note that you will see labs on mychart as soon as they post. I will later go in and write notes on them- will say "notes from Dr. Durene Cal"

## 2020-03-03 NOTE — Progress Notes (Signed)
Phone: [phone]   Subjective:  Patient presents today for their annual physical. Chief complaint-noted.   See problem oriented charting- Review of Systems  HENT: Negative.   Eyes: Negative.   Respiratory: Negative.   Cardiovascular: Negative.   Gastrointestinal: Negative.   Genitourinary: Negative.   Musculoskeletal: Negative.   Skin: Negative.   Neurological: Negative.   Endo/Heme/Allergies: Negative.   Psychiatric/Behavioral: Negative.    The following were reviewed and entered/updated in epic: Past Medical History:  Diagnosis Date  . Allergy   . Diabetes mellitus   . Hyperlipidemia   . Hypertension   . Myocardial infarction [facility])    May 2016  . Nephrolithiasis    3x 2007-2015, passed on his own  . Sleep apnea    wears CPAP   Patient Active Problem List   Diagnosis Date Noted  . Diabetic retinopathy (HCC) 02/12/2017    Priority: High  . CAD (coronary artery disease) 02/15/2015    Priority: High  . Type 2 diabetes mellitus with circulatory disorder, with long-term current use of insulin (HCC) 02/04/2006    Priority: High  . Hyperlipidemia 02/04/2006    Priority: Medium  . Essential hypertension 02/04/2006    Priority: Medium  . Morbid obesity (HCC) 08/13/2017    Priority: Low  . Nephrolithiasis     Priority: Low  . 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 RECTAL FISSURE 06/05/2006    Priority: Low  . OSA on CPAP 08/26/2019   Past Surgical History:  Procedure Laterality Date  . CARDIAC CATHETERIZATION     stent May 2016  . COLONOSCOPY    . heart stent     May 2016  . HERNIA REPAIR      Family History  Problem Relation Age of Onset  . Coronary artery disease Father   . Liver disease Father        from heart meds per pt  . Diabetes Sister        grandmother  . Kidney disease Sister        dialysis from dm  . Diabetes Sister   . Other Sister        flash pulmonary edema led to death  . Colon cancer Neg Hx   . Esophageal cancer Neg  Hx   . Rectal cancer Neg Hx   . Stomach cancer Neg Hx     Medications- reviewed and updated Current Outpatient Medications  Medication Sig Dispense Refill  . aspirin 81 MG tablet Take 81 mg by mouth daily.     Marland Kitchen atorvastatin (LIPITOR) 40 MG tablet Take 1 tablet (40 mg total) by mouth every other day. 46 tablet 3  . carvedilol (COREG) 3.125 MG tablet Take 1 tablet (3.125 mg total) by mouth 2 (two) times daily. 180 tablet 3  . empagliflozin (JARDIANCE) 25 MG TABS tablet Take 1 tablet (25 mg total) by mouth daily. 90 tablet 2  . EQ FIBER SUPPLEMENT PO Take by mouth.    . fluticasone (FLONASE) 50 MCG/ACT nasal spray Place into both nostrils daily as needed for allergies or rhinitis.    Marland Kitchen glucose blood test strip 1 each by Other route as needed. Use as instructed     . insulin aspart (NOVOLOG FLEXPEN) 100 UNIT/ML FlexPen Inject 28-48 Units into the skin 3 (three) times daily with meals. 15 pen 11  . insulin glargine, 2 Unit Dial, (TOUJEO MAX SOLOSTAR) 300 UNIT/ML Solostar Pen Inject 80 Units into the skin daily. 24 mL 3  .  Insulin Pen Needle (CLICKFINE PEN NEEDLES) 32G X 4 MM MISC Use 3x a day 200 each 11  . lisinopril (ZESTRIL) 40 MG tablet Take 1 tablet (40 mg total) by mouth daily. 90 tablet 3  . metFORMIN (GLUCOPHAGE) 1000 MG tablet Take 1 tablet (1,000 mg total) by mouth 2 (two) times daily with a meal. 180 tablet 3  . Omega-3 Fatty Acids (FISH OIL) 1000 MG CAPS Take 4 capsules by mouth daily.    . Semaglutide, 1 MG/DOSE, (OZEMPIC, 1 MG/DOSE,) 4 MG/3ML SOPN Inject 0.75 mLs (1 mg total) into the skin once a week. 9 mL 3   No current facility-administered medications for this visit.    Allergies-reviewed and updated Allergies  Allergen Reactions  . Invokana [Canagliflozin] Other (See Comments)    Yeast infection    Social History   Social History Narrative   Married. 2 children son and daughter. Granddaughter 12/02/2014 born and grandson 7/17//2018.       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 Oropharynx normal Neck: stable thyromegaly CV: RRR no murmurs rubs or gallops Lungs: CTAB no crackles, wheeze, rhonchi Abdomen: soft/nontender/nondistended/normal bowel sounds. No rebound or guarding. Umbilical hernia Ext: trace edema Skin: warm, dry Neuro: grossly normal, moves all extremities, PERRLA  Diabetic Foot Exam - Simple   Simple Foot Form Diabetic Foot exam was performed with the following findings: Yes 03/03/2020  9:13 AM  Visual Inspection No deformities, no ulcerations, no other skin breakdown bilaterally: Yes Sensation Testing Intact to touch and monofilament testing bilaterally: Yes Pulse Check Posterior Tibialis and Dorsalis pulse intact bilaterally: Yes Comments      Assessment and Plan  64 y.o. male presenting for annual physical.  Health Maintenance counseling: 1. Anticipatory guidance: Patient counseled regarding regular dental exams yes q6 months- needs to update as over a year, eye exams - has to get this updated,  avoiding smoking and second hand smoke , limiting alcohol to 0 beverages per day.   2. Risk factor reduction:  Advised patient of need for regular exercise and diet rich and fruits and vegetables to reduce risk of heart attack and stroke. Exercise- 2 days a week golfing (rides but tries to walk out from cart) and some walking.  Goal 150 mins of exercise.  Diet-cooks at home, reasonably healthy diet- has cut back on bread and that has helped him- down 7 lbs from march Wt Readings from Last 3 Encounters:  03/03/20 245 lb 12.8 oz (111.5 kg)  11/25/19 247 lb (112 kg)  08/26/19 248 lb (112.5 kg)  3. Immunizations/screenings/ancillary studies- already had flu shot today.  Discussed covid 19 booster- planned tomorrow. Needs diabetic eye exam  Immunization History  Administered Date(s) Administered  . Influenza Split 04/17/2011  . Influenza Whole 01/02/2007, 01/07/2009, 02/04/2010, 01/06/2012  . Influenza, Quadrivalent, Recombinant, Inj, Pf 01/04/2019  . Influenza,inj,Quad PF,6+ Mos 03/14/2013, 02/03/2014, 01/06/2015, 11/28/2017  . Influenza-Unspecified 12/25/2012, 01/28/2016, 01/13/2017  . PFIZER SARS-COV-2 Vaccination 05/30/2019, 06/30/2019  . Pneumococcal Conjugate-13 07/15/2013  . Pneumococcal Polysaccharide-23 05/11/2008, 08/19/2014  . Tdap 07/15/2013  . Zoster Recombinat (Shingrix) 02/24/2019, 08/26/2019  4. Prostate cancer screening-  will  trend psa with labs- prior low risk trend  Lab Results  Component Value Date   PSA 0.59 02/24/2019   PSA 0.62 02/18/2018   PSA 0.70 02/12/2017   5. Colon cancer screening - 04/28/16 with 10 year follow up planned 6. Skin cancer screening- no recent checks. advised regular sunscreen use. Denies worrisome, changing, or new skin lesions.  7. Never smoker 8. STD screening - declines as only active with wife  Status of chronic or acute concerns   #hypertension S: compliant with coreg 3.125mg  BID, lisinopril 40mg   A/P: Review control of continue current medication  #hyperlipidemia S: previously mild poorly controlled on atorvastatin 40mg  every other day with LDL above 70.  On 02/24/2019 we increased dose to 40 mg daily  Lab Results  Component Value Date   CHOL 179 02/24/2019   HDL 34.70 (L) 02/24/2019   LDLCALC 75 02/12/2017   LDLDIRECT 59.0 08/26/2019   TRIG 245.0 (H) 02/24/2019   CHOLHDL 5 02/24/2019   A/P: last LDL looked great. Hopefully improved control on full panelwith LDL goal under 70 with CAD history  #Obesity- last yearpatient no longer in morbid obesity category with BMI under 35! Weight has crept up now and -->   # morbid obesity with BMI over 35 with htn, hld, dm S: Goal by follow up late 2020  was weight at 240 or below. Has been stable in 240s.  Wt Readings from Last 3 Encounters:  03/03/20 245 lb 12.8 oz (111.5 kg)  11/25/19 247 lb (112 kg)  08/26/19 248 lb (112.5 kg)  A/P: would like to have improved control- . Encouraged need for healthy eating, regular exercise, weight loss. Goal 5 lbs off by follow up  %# Diabetes with history of diabetic retinopathy S: followed closely by Dr. 11/27/19. Fructosamine more relevant for him- we do a1c when we see him primarily for metrics.   Patient compliant with basal insulin  for basal with 80  unitsper day, lispro BID at least 28 units. now on ozempic as well. Orally on jardiance 25mg , metformin 1000mg  BID. Reports under 7 when using fructosamine  Has history retinopathy- reports needs to schedule with new eye doctor- last one retired  A/P: diabetes has been controlled based off fructosamine. We will get a1c for metrics.   Encouraged him to schedule with new eye doctor with retinopathy history- glad fructosamine has been controlled  %# CAD- follows with Dr. 10/26/19 cardiology S:compliant with aspirin and statin. Asymptomatic with no chest pain or shortness of breath . Saw cardiology last - probably a few years ago.   1 stent in place A/P: reasonable control- continue current meds  # Elvera Lennox FN 02/12/2019 benign with Dr. .   Recommended follow up: Return in about 6 months (around 09/01/2020) for follow up- or sooner if needed. Future Appointments  Date Time Provider Department Center  03/31/2020  9:00 AM 02/14/2019, MD [id number] None   Lab/Order associations: fasting   ICD-10-CM   1. Preventative health care  Z00.00 Hemoglobin A1c  2. Essential hypertension  I10   3. Type 2 diabetes mellitus with other circulatory complication, with long-term current use of insulin (HCC)  E11.59 Hemoglobin A1c   Z79.4   4. Hyperlipidemia, unspecified hyperlipidemia type  E78.5     No orders of the defined types were placed in  this encounter.   Return precautions advised.  Elvera Lennox, MD

## 2020-03-03 NOTE — Telephone Encounter (Signed)
Called and confirmed patient should be prescribed NovoLog and Tuejeo. Pharmacy confirmed prescription.

## 2020-03-03 NOTE — Telephone Encounter (Signed)
Zachary Warner with [facility] requests to be called at ph# [phone] for clarification on a RX for patient's insulin (Patient is not sure what insulin he is taking).

## 2020-03-03 NOTE — Telephone Encounter (Signed)
He can get whichever is better covered by his insurance.  If I were to state a preference, I would like him to be on Humalog U200.  However, Let's go with his only if cheaper than the NovoLog or the U100 Humalog.

## 2020-03-03 NOTE — Telephone Encounter (Signed)
Patients med list has him on Novolog and Toujeo insulin but his last OV notes has Humalog and Toujeo listed as his insulin with instructions. The pharmacy wants clarification on which insulin he should be getting. Please advise.

## 2020-03-04 LAB — CBC WITH DIFFERENTIAL/PLATELET
Absolute Monocytes: 489 cells/uL (ref 200–950)
Basophils Absolute: 40 cells/uL (ref 0–200)
Basophils Relative: 0.6 %
Eosinophils Absolute: 261 cells/uL (ref 15–500)
Eosinophils Relative: 3.9 %
HCT: 43.3 % (ref 38.5–50.0)
Hemoglobin: 14.6 g/dL (ref 13.2–17.1)
Lymphs Abs: 2586 cells/uL (ref 850–3900)
MCH: 28.9 pg (ref 27.0–33.0)
MCHC: 33.7 g/dL (ref 32.0–36.0)
MCV: 85.7 fL (ref 80.0–100.0)
MPV: 10.4 fL (ref 7.5–12.5)
Monocytes Relative: 7.3 %
Neutro Abs: 3323 cells/uL (ref 1500–7800)
Neutrophils Relative %: 49.6 %
Platelets: 193 10*3/uL (ref 140–400)
RBC: 5.05 10*6/uL (ref 4.20–5.80)
RDW: 13.4 % (ref 11.0–15.0)
Total Lymphocyte: 38.6 %
WBC: 6.7 10*3/uL (ref 3.8–10.8)

## 2020-03-04 LAB — COMPLETE METABOLIC PANEL WITH GFR
AG Ratio: 1.6 (calc) (ref 1.0–2.5)
ALT: 23 U/L (ref 9–46)
AST: 17 U/L (ref 10–35)
Albumin: 4.2 g/dL (ref 3.6–5.1)
Alkaline phosphatase (APISO): 53 U/L (ref 35–144)
BUN: 19 mg/dL (ref 7–25)
CO2: 26 mmol/L (ref 20–32)
Calcium: 9.6 mg/dL (ref 8.6–10.3)
Chloride: 105 mmol/L (ref 98–110)
Creat: 0.9 mg/dL (ref 0.70–1.25)
GFR, Est African American: 104 mL/min/{1.73_m2} (ref >=60)
GFR, Est Non African American: 90 mL/min/{1.73_m2} (ref >=60)
Globulin: 2.7 g/dL (calc) (ref 1.9–3.7)
Glucose, Bld: 101 mg/dL — ABNORMAL HIGH (ref 65–99)
Potassium: 4.5 mmol/L (ref 3.5–5.3)
Sodium: 140 mmol/L (ref 135–146)
Total Bilirubin: 0.8 mg/dL (ref 0.2–1.2)
Total Protein: 6.9 g/dL (ref 6.1–8.1)

## 2020-03-04 LAB — LIPID PANEL W/REFLEX DIRECT LDL
Cholesterol: 111 mg/dL (ref <200)
HDL: 36 mg/dL — ABNORMAL LOW (ref >=40)
LDL Cholesterol (Calc): 51 mg/dL (calc)
Non-HDL Cholesterol (Calc): 75 mg/dL (calc) (ref <130)
Total CHOL/HDL Ratio: 3.1 (calc) (ref <5.0)
Triglycerides: 159 mg/dL — ABNORMAL HIGH (ref <150)

## 2020-03-04 LAB — HEMOGLOBIN A1C
Hgb A1c MFr Bld: 7.2 % of total Hgb — ABNORMAL HIGH (ref <5.7)
Mean Plasma Glucose: 160 mg/dL
eAG (mmol/L): 8.9 mmol/L

## 2020-03-04 LAB — PSA: PSA: 0.85 ng/mL (ref <=4.0)

## 2020-03-05 ENCOUNTER — Other Ambulatory Visit: Payer: Self-pay | Admitting: Family Medicine

## 2020-03-31 ENCOUNTER — Other Ambulatory Visit: Payer: Self-pay

## 2020-03-31 ENCOUNTER — Encounter: Payer: Self-pay | Admitting: Internal Medicine

## 2020-03-31 ENCOUNTER — Ambulatory Visit: Payer: BC Managed Care – PPO | Admitting: Internal Medicine

## 2020-03-31 VITALS — BP 110/72 | HR 94 | Ht 70.0 in | Wt 244.6 lb

## 2020-03-31 DIAGNOSIS — E042 Nontoxic multinodular goiter: Secondary | ICD-10-CM | POA: Diagnosis not present

## 2020-03-31 DIAGNOSIS — E669 Obesity, unspecified: Secondary | ICD-10-CM | POA: Diagnosis not present

## 2020-03-31 DIAGNOSIS — E785 Hyperlipidemia, unspecified: Secondary | ICD-10-CM

## 2020-03-31 DIAGNOSIS — Z794 Long term (current) use of insulin: Secondary | ICD-10-CM

## 2020-03-31 DIAGNOSIS — E1159 Type 2 diabetes mellitus with other circulatory complications: Secondary | ICD-10-CM | POA: Diagnosis not present

## 2020-03-31 HISTORY — DX: Nontoxic multinodular goiter: E04.2

## 2020-03-31 LAB — TSH: TSH: 1.14 u[IU]/mL (ref 0.35–4.50)

## 2020-03-31 MED ORDER — OZEMPIC (1 MG/DOSE) 4 MG/3ML ~~LOC~~ SOPN: 1.0000 mg | PEN_INJECTOR | SUBCUTANEOUS | 3 refills | Status: DC

## 2020-03-31 NOTE — Progress Notes (Addendum)
Patient ID: Zachary Warner, male   DOB: 1955/06/30, 65 y.o.   MRN: [id number]  This visit occurred during the SARS-CoV-2 public health emergency.  Safety protocols were in place, including screening questions prior to the visit, additional usage of staff PPE, and extensive cleaning of exam room while observing appropriate contact time as indicated for disinfecting solutions.   HPI: Zachary Warner is a 65 y.o.-year-old male, presenting for f/u for DM2, dx in ~1995, insulin-dependent since ~2005, uncontrolled, with complications (CAD - s/p AMI, DR). Last visit 4 months ago.  His mother is sick (fell and had fractures, has lung CA), had sx this am. He is still very busy with her.  Reviewed HbA1c levels: Lab Results  Component Value Date   HGBA1C 7.2 (H) 03/03/2020   HGBA1C 8.6 (A) 06/24/2019   HGBA1C 7.6 (A) 01/10/2019  11/25/2019: HbA1c calculated from fructosamine is 6.2% 01/10/2019: HbA1c calculated from fructosamine 6.35% 04/09/2017: HbA1c calculated from fructosamine is 6.8% 11/28/2017:  Hba1c calculated from fructosamine is better: 6.45% 07/11/2017: HbA1c calculated from the fructosamine is 6.6%. 03/08/2017: HbA1c calculated from the fructosamine is 6.5%. 11/06/2016: HbA1c calculated from the fructosamine is slightly higher, at 6.6% 07/05/2016: HbA1c calculated from the fructosamine is 6.2% 04/05/2016: HbA1c calculated from fructosamine is excellent, at 6% 01/04/2016: HbA1c calculated from fructosamine is  6.55%. 11/10/2015: HbA1c calculated from fructosamine is 5.8%. 08/10/2015: HbA1c calculated from fructosamine is 7.05%. 04/09/2015: HbA1c calculated fructosamine (326): 7.1%, which is much more concordant with his sugar log.   He is on: - Metformin 1000 mg 2x a day. - Jardiance 25 mg daily before breakfast -he initially had yeast infections, now resolved - Ozempic 0.5 mg weekly-added 07/2019 >> 1 mg weekly - Tresiba U200  >> Toujeo 80 units daily - Humalog: 24 units before  breakfast 36 units before lunch (increased 07/2018) 44-48 units before dinner  We stopped Januvia 2/2 large doses of mealtime insulin.  He checks his sugars 0 to once a day: - am: 120-175 >> 98-154, 163 >> 102-148, 158 - 2h after b'fast: 189-251 >> 244 x1 >> n/c - lunch:   n/c >> 104-195 >> 126-160 >> n/c - 2h after lunch: 140-240, 368 >> 130-189, 240 >> 89-142, 240 (Christmas) - dinner: 137-207, 325 >> 120-198 >> 160 - 2h after dinner:  150, 189 >> 148-189, 300 >> 200 - bedtime:  n/c >> 194-291 >> 133-190 >> n/c - nighttime:  45 (see below) >> n/c >> 200 >> n/c Low sugar: 50 - at night >> 104 >> 98 >> 89; has hypoglycemia awareness in the 60s. Highest sugar was 368 >> 300 >> 240.  He has a ReliOn meter.  Pt's meals are: - Breakfast: grits or sandwich - Lunch: sandwich - Dinner: chicken or beef or pork + vegetables - Snacks: 1 or 2 snacks  - crackers, peanuts or cookies  No CKD, last BUN/creatinine:  Lab Results  Component Value Date   BUN 19 03/03/2020   CREATININE 0.90 03/03/2020  On lisinopril.  + HL; last set of lipids: Lab Results  Component Value Date   CHOL 111 03/03/2020   HDL 36 (L) 03/03/2020   LDLCALC 51 03/03/2020   LDLDIRECT 59.0 08/26/2019   TRIG 159 (H) 03/03/2020   CHOLHDL 3.1 03/03/2020  On Lipitor 40 mg every day and fish oil 2 capsules twice a day  -Latest eye exam: 11/2017: + DR. He was seeing Dr Ambrose Mantle [facility] - retired.  He will see Dr. Drucilla Schmidt - no numbness  and tingling in his feet.  He also has a history of HTN, h/o nephrolithiasis x 3, last in 03/2013, OSA.   He had an MI 07/2014 >> had a stent placed. Cardiologist: Dr Sedonia SmallKrosowski. He is on supplementation with B12.  She also has a history of thyroid nodules:  Thyroid U/S (01/22/2019): Several nodules, of which 2 were dominant:  Narrative & Impression    Parenchymal Echotexture: Moderately heterogenous Isthmus: 1.0 cm Right lobe: 5.4 x 2.0 x 1.8 cm Left lobe: 4.7 x 2.4 x  2.0 cm _____________________________________  Estimated total number of nodules >/= 1 cm: 5 _____________________________________  Nodule # 1: Location: Isthmus; Mid Maximum size: 1.9 cm; Other 2 dimensions: 1.3 x 1.7 cm Composition: cannot determine (2) Echogenicity: hypoechoic (2) Echogenic foci: macrocalcifications (1) ACR TI-RADS total points: 5. **Given size (>/= 1.5 cm) and appearance, fine needle aspiration of this moderately suspicious nodule should be considered based on TI-RADS criteria. _________________________________________________________  Nodule # 2: Location: Right; Superior Maximum size: 1.0 cm; Other 2 dimensions: 0.6 x 0.7 cm Composition: solid/almost completely solid (2) Echogenicity: hypoechoic (2) Echogenic foci: punctate echogenic foci (3) ACR TI-RADS total points: 7. **Given size (>/= 1.0 cm) and appearance, fine needle aspiration of this highly suspicious nodule should be considered based on TI-RADS criteria. _________________________________________________________  Nodule #3 is a predominantly cystic nodule the mid right thyroid lobe that measures 1.1 x 0.8 x 0.8 cm.  Nodule #4 is a cystic nodule in the superior left thyroid lobe that measures 1.5 x 0.9 x 1.0 cm.  Nodule #5 is a mildly complex cystic nodule with septations in the mid left thyroid lobe that measures 1.2 x 1.0 x 1.3 cm.  Nodule # 6: Location: Left; Mid Maximum size: 0.8 cm; Other 2 dimensions: 0.6 x 0.5 cm Composition: mixed cystic and solid (1) Echogenicity: isoechoic (1) Shape: taller-than-wide (3) ACR TI-RADS total points: 5. Given size (<0.9 cm) and appearance, this nodule does NOT meet TI-RADS criteria for biopsy or dedicated follow-up. _______________________________________  IMPRESSION: 1. Multinodular goiter. 2. Nodule #1 in the isthmus and nodule #2 in the superior right thyroid lobe both meet criteria for ultrasound-guided biopsy. 3. Multiple cystic  nodules that do not meet criteria for biopsy.  The above is in keeping with the ACR TI-RADS recommendations - J Am Coll Radiol 2017;14:587-595.  Electronically Signed   By: Richarda OverlieAdam  Henn M.D.   On: 01/22/2019 17:11    Biopsies of the dominant nodules (02/13/2019): Benign:  Clinical History: Nodule 1 Isthmus Mid 1.9 cm; Other 2 dimensions: 1.3 x  1.7 cm, Hypoechoic, ACR TI-RADS total points: 5, Moderately suspicious  nodule  Specimen Submitted: A. THYROID ISTHMUS, FINE NEEDLE ASPIRATION:   FINAL MICROSCOPIC DIAGNOSIS:  - Consistent with benign follicular nodule (Bethesda category II)   SPECIMEN ADEQUACY:  Satisfactory for evaluation   CYTOLOGY - NON PAP  CASE: [id number]  PATIENT: Jasyn Benavides  Non-Gynecological Cytology Report   Clinical History: Nodule 2 Right Superior 1.0 cm; Other 2 dimensions:  0.6 x 0.7 cm, Solid almost completely solid, Hypoechoic, ACR TI-RADS  total points: 7, Highly suspicious nodule  Specimen Submitted: A. THYROID, RT LOBE RUP, FINE NEEDLE ASPIRATION:   FINAL MICROSCOPIC DIAGNOSIS:  - Consistent with benign follicular nodule (Bethesda category II)   SPECIMEN ADEQUACY:  Satisfactory for evaluation  Pt denies: - feeling nodules in neck - hoarseness - dysphagia - choking - SOB with lying down  Latest TSH was normal: Lab Results  Component Value Date   TSH 0.90 01/10/2019  ROS: Constitutional: no weight gain/no weight loss, no fatigue, no subjective hyperthermia, no subjective hypothermia Eyes: no blurry vision, no xerophthalmia ENT: no sore throat, + see HPI Cardiovascular: no CP/no SOB/no palpitations/no leg swelling Respiratory: no cough/no SOB/no wheezing Gastrointestinal: no N/no V/no D/no C/no acid reflux Musculoskeletal: no muscle aches/no joint aches Skin: no rashes, no hair loss Neurological: no tremors/no numbness/no tingling/no dizziness  I reviewed pt's medications, allergies, PMH, social hx, family hx, and changes  were documented in the history of present illness. Otherwise, unchanged from my initial visit note.  Past Medical History:  Diagnosis Date  . Allergy   . Diabetes mellitus   . Hyperlipidemia   . Hypertension   . Myocardial infarction [facility])    May 2016  . Nephrolithiasis    3x 2007-2015, passed on his own  . Sleep apnea    wears CPAP   Past Surgical History:  Procedure Laterality Date  . CARDIAC CATHETERIZATION     stent May 2016  . COLONOSCOPY    . heart stent     May 2016  . HERNIA REPAIR     Social History   Socioeconomic History  . Marital status: Married    Spouse name: Not on file  . Number of children: Not on file  . Years of education: Not on file  . Highest education level: Not on file  Occupational History  . Not on file  Tobacco Use  . Smoking status: Never Smoker  . Smokeless tobacco: Never Used  Substance and Sexual Activity  . Alcohol use: No  . Drug use: No  . Sexual activity: Never  Other Topics Concern  . Not on file  Social History Narrative   Married. 2 children son and daughter. Granddaughter 12/02/2014 born and grandson 7/17//2018.       Works Administrator, sports: golfing (low to mid 80s)   Social Determinants of Corporate investment banker Strain: Not on file  Food Insecurity: Not on file  Transportation Needs: Not on file  Physical Activity: Not on file  Stress: Not on file  Social Connections: Not on file  Intimate Partner Violence: Not on file   Current Outpatient Medications on File Prior to Visit  Medication Sig Dispense Refill  . aspirin 81 MG tablet Take 81 mg by mouth daily.     Marland Kitchen atorvastatin (LIPITOR) 40 MG tablet Take 1 tablet (40 mg total) by mouth every other day. 15 tablet 3  . carvedilol (COREG) 3.125 MG tablet Take 1 tablet (3.125 mg total) by mouth 2 (two) times daily. 60 tablet 3  . empagliflozin (JARDIANCE) 25 MG TABS tablet Take 1 tablet (25 mg total) by mouth daily. 90 tablet 2  . EQ  FIBER SUPPLEMENT PO Take by mouth.    . fluticasone (FLONASE) 50 MCG/ACT nasal spray Place into both nostrils daily as needed for allergies or rhinitis.    Marland Kitchen glucose blood test strip 1 each by Other route as needed. Use as instructed     . insulin aspart (NOVOLOG FLEXPEN) 100 UNIT/ML FlexPen Inject 28-48 Units into the skin 3 (three) times daily with meals. 15 pen 11  . insulin glargine, 2 Unit Dial, (TOUJEO MAX SOLOSTAR) 300 UNIT/ML Solostar Pen Inject 80 Units into the skin daily. 24 mL 3  . Insulin Pen Needle (CLICKFINE PEN NEEDLES) 32G X 4 MM MISC Use 3x a day 200 each 11  . lisinopril (ZESTRIL) 40 MG tablet Take  1 tablet (40 mg total) by mouth daily. 30 tablet 3  . metFORMIN (GLUCOPHAGE) 1000 MG tablet Take 1 tablet (1,000 mg total) by mouth 2 (two) times daily with a meal. 180 tablet 3  . Omega-3 Fatty Acids (FISH OIL) 1000 MG CAPS Take 4 capsules by mouth daily.    . Semaglutide, 1 MG/DOSE, (OZEMPIC, 1 MG/DOSE,) 4 MG/3ML SOPN Inject 0.75 mLs (1 mg total) into the skin once a week. 9 mL 3   No current facility-administered medications on file prior to visit.   Allergies  Allergen Reactions  . Invokana [Canagliflozin] Other (See Comments)    Yeast infection   Family History  Problem Relation Age of Onset  . Coronary artery disease Father   . Liver disease Father        from heart meds per pt  . Diabetes Sister        grandmother  . Kidney disease Sister        dialysis from dm  . Diabetes Sister   . Other Sister        flash pulmonary edema led to death  . Colon cancer Neg Hx   . Esophageal cancer Neg Hx   . Rectal cancer Neg Hx   . Stomach cancer Neg Hx     PE: BP 110/72   Pulse 94   Ht 5\' 10"  (1.778 m)   Wt 244 lb 9.6 oz (110.9 kg)   SpO2 96%   BMI 35.10 kg/m  Body mass index is 35.1 kg/m. Wt Readings from Last 3 Encounters:  03/31/20 244 lb 9.6 oz (110.9 kg)  03/03/20 245 lb 12.8 oz (111.5 kg)  11/25/19 247 lb (112 kg)   Constitutional: overweight, in  NAD Eyes: PERRLA, EOMI, no exophthalmos ENT: moist mucous membranes, no thyromegaly but left thyroid nodule felt on palpation, no cervical lymphadenopathy Cardiovascular: RRR, No MRG Respiratory: CTA B Gastrointestinal: abdomen soft, NT, ND, BS+ Musculoskeletal: no deformities, strength intact in all 4 Skin: moist, warm, no rashes Neurological: no tremor with outstretched hands, DTR normal in all 4    ASSESSMENT: 1. DM2, insulin-dependent, controlled, with complications - CAD, s/p AMi - DR  He does not have a history of pancreatitis or family history of medullary thyroid cancer.    2. HL  3.  Obesity  4.  Thyroid nodules  PLAN:  1. Patient with longstanding, fairly well-controlled type 2 diabetes, on a complex medication regimen with Metformin, SGLT2 inhibitor, weekly GLP-1 receptor agonist and basal-bolus insulin regimen.  At last visit, we increased the dose of insulin sent as sugars were slightly above target, with spikes in the hyperglycemic range due to dietary indiscretions, but definitely improved compared to the previous visit.  At that time, I advised him to write comments in his log regarding the abnormal blood sugars.  We kept the rest of his regimen the same HbA1c calculated from fructosamine was excellent, at 6.2%, but this was lower than expected from his log.  He had another HbA1c obtained by PCP last month and this was improved from before, at 7.2%. -At today's visit, he brings his blood sugars for the last 3 to 4 weeks and they appear to be mostly at goal in the morning, but fluctuating later in the day.  However, he mentions that he has been very stressed and busy with his mother being sick and also during the holidays.  For now, also in the light of the improved HbA1c from last month, we will continue the  current regimen but I again advised him to write down comments about the abnormal blood sugars and write down the date when he starts a new CBG log sheet so he does not  mix up the sheets. - I suggested to:  Patient Instructions  Please continue: - Metformin 1000 mg 2x a day. - Jardiance 25 mg daily before breakfast  - Toujeo 80 units daily - Humalog: 24 units before breakfast 36 units before lunch  44-48 units before dinner  - Ozempic 1 mg weekly  Please write down comments about the abnormal blood sugars.  Please return in 3-4 months with your sugar log.   - we will check a fructosamine level today - advised to check sugars at different times of the day - 3-4x a day, rotating check times - advised for yearly eye exams >> he is not UTD - return to clinic in 3-4 months  2. HL -Reviewed latest lipid panel from 02/2020: LDL at goal, triglycerides only slightly high, HDL slightly low: Lab Results  Component Value Date   CHOL 111 03/03/2020   HDL 36 (L) 03/03/2020   LDLCALC 51 03/03/2020   LDLDIRECT 59.0 08/26/2019   TRIG 159 (H) 03/03/2020   CHOLHDL 3.1 03/03/2020  -Continues Lipitor every day now, and fish oil twice a day without side effects  3.  Obesity -continue SGLT 2 inhibitor and GLP-1 receptor agonist which should also help with weight loss -He gained 5 pounds before last visit, but lost 3 pounds since last visit, despite the holidays.  4.  Thyroid nodules -Felt on palpation -He denies neck compression symptoms -latest TSH was normal: Lab Results  Component Value Date   TSH 0.90 01/10/2019  -I reviewed the results of his latest thyroid ultrasound: 2 dominant nodules, which were biopsied with benign results -We will continue to follow him clinically and will also check another ultrasound now -We will also recheck a TSH now  Orders Placed This Encounter  Procedures  . US THYROID  . Fructosamine  . TSH   Component     Latest Ref Rng & Units 03/31/2020  Fructosamine     205 - 285 umol/L 264  TSH     0.35 - 4.50 uIU/mL 1.14  HbA1c calculated from fructosamine is at goal, at 6.1%.  US THYROID CLINICAL DATA:   Goiter.  Follow-up nodules  EXAM: THYROID ULTRASOUND  TECHNIQUE: Ultrasound examination of the thyroid gland and adjacent soft tissues was performed.  COMPARISON:  None.  FINDINGS: Parenchymal Echotexture: Moderately heterogeneous  Isthmus: 0.8 cm  Right lobe: 4.4 x 2.0 x 1.6 cm  Left lobe: 4.8 x 2.0 x 1.8 cm  _________________________________________________________  Estimated total number of nodules >/= 1 cm: 4  Number of spongiform nodules >/=  2 cm not described below (TR1): 0  Number of mixed cystic and solid nodules >/= 1.5 cm not described below (TR2): 0  _________________________________________________________  Nodule 1: 1.1 x 1.1 x 0.7 cm nodule located in the isthmus has decreased in size since prior examination where it measured 1.9 x 1.7 x 1.3 cm. FNA of this nodule was performed on 02/12/2019. Please correlate with results.  Nodule 2: 1.0 x 0.9 x 0.6 cm nodule located in the superior right thyroid lobe is not significantly changed in size measuring 1.0 x 0.7 x 0.6 cm on the prior exam. FNA of this nodule was performed on 02/12/2019. Please correlate with results.  Nodule 3: 1.2 x 0.9 x 0.7 cm cystic nodule located in the  mid right thyroid lobe is not significantly changed in size since prior exam where it measured 1.1 x 0.8 x 0.8 cm. It does not meet criteria for FNA or imaging follow-up.  Nodule 4: 0.6 x 0.6 x 0.3 cm solid hypoechoic nodule in the inferior right thyroid lobe is new since the prior examination. It does not meet criteria for FNA or imaging follow-up.  Nodule 5: 1.6 x 0.9 x 0.8 cm cystic nodule in the superior left thyroid lobe is not significantly changed in size since prior examination when where measured 1.5 x 1.0 x 0.9 cm. This does not meet criteria for FNA or imaging follow-up.  Nodule 6: 0.8 x 0.7 x 0.6 cm solid isoechoic nodule in the mid left thyroid lobe previously measured 1.2 x 1.0 x 1.3 cm. Interval decrease in size  favors a benign etiology. This nodule does not meet criteria for imaging follow-up or FNA.  _________________________________________________________  IMPRESSION: Bilateral thyroid nodules.  Nodules 1 and 2 were previously biopsied. Please correlate with results.  Remaining nodules do not meet criteria for FNA or imaging follow-up.  The above is in keeping with the ACR TI-RADS recommendations - J Am Coll Radiol 2017;14:587-595.  Electronically Signed   By: Acquanetta Belling M.D.   On: 04/20/2020 07:53    Carlus Pavlov, MD PhD [facility]

## 2020-03-31 NOTE — Patient Instructions (Signed)
Please continue: - Metformin 1000 mg 2x a day. - Jardiance 25 mg daily before breakfast  - Toujeo 80 units daily - Humalog: 24 units before breakfast 36 units before lunch  44-48 units before dinner  - Ozempic 1 mg weekly  Please write down comments about the abnormal blood sugars.  Please return in 3-4 months with your sugar log.

## 2020-04-02 LAB — FRUCTOSAMINE: Fructosamine: 264 umol/L (ref 205–285)

## 2020-04-19 ENCOUNTER — Ambulatory Visit
Admission: RE | Admit: 2020-04-19 | Discharge: 2020-04-19 | Disposition: A | Discharge status: Home / Self Care | Payer: BC Managed Care – PPO | Source: Ambulatory Visit | Attending: Internal Medicine | Admitting: Internal Medicine

## 2020-04-19 DIAGNOSIS — E042 Nontoxic multinodular goiter: Secondary | ICD-10-CM

## 2020-05-13 ENCOUNTER — Telehealth: Payer: Self-pay | Admitting: Internal Medicine

## 2020-05-13 NOTE — Telephone Encounter (Signed)
MEDICATION: Novolog  PHARMACY:  [facility] in [place]  HAS THE PATIENT CONTACTED THEIR PHARMACY?  yes  IS THIS A 90 DAY SUPPLY : no  IS PATIENT OUT OF MEDICATION: yes  IF NOT; HOW MUCH IS LEFT:   LAST APPOINTMENT DATE: @1 /07/2020  NEXT APPOINTMENT DATE:@5 /07/2020  DO WE HAVE YOUR PERMISSION TO LEAVE A DETAILED MESSAGE?: yes  OTHER COMMENTS:    **Let patient know to contact pharmacy at the end of the day to make sure medication is ready. **  ** Please notify patient to allow 48-72 hours to process**  **Encourage patient to contact the pharmacy for refills or they can request refills through [facility]**

## 2020-05-14 ENCOUNTER — Other Ambulatory Visit: Payer: Self-pay | Admitting: *Deleted

## 2020-05-14 MED ORDER — NOVOLOG FLEXPEN 100 UNIT/ML ~~LOC~~ SOPN: 28.0000 [IU] | PEN_INJECTOR | Freq: Three times a day (TID) | SUBCUTANEOUS | 1 refills | Status: DC

## 2020-05-14 NOTE — Telephone Encounter (Signed)
Rx sent 

## 2020-06-04 ENCOUNTER — Telehealth: Payer: Self-pay | Admitting: Internal Medicine

## 2020-06-04 DIAGNOSIS — E1159 Type 2 diabetes mellitus with other circulatory complications: Secondary | ICD-10-CM

## 2020-06-04 DIAGNOSIS — Z794 Long term (current) use of insulin: Secondary | ICD-10-CM

## 2020-06-04 MED ORDER — FLUCONAZOLE 150 MG PO TABS: ORAL_TABLET | ORAL | 1 refills | Status: DC

## 2020-06-04 NOTE — Addendum Note (Signed)
Addended by: Kenyon Ana on: 06/04/2020 06:19 PM   Modules accepted: Orders

## 2020-06-04 NOTE — Telephone Encounter (Signed)
Rx sent to preferred pharmacy.

## 2020-06-04 NOTE — Telephone Encounter (Signed)
OK - 1 tablet with 1 refill.  This cannot come from the pharmacy as an automatic refill, patient needs to let us know when he needs it.

## 2020-06-04 NOTE — Telephone Encounter (Signed)
Patient called re: the following RX request. Patient states his PHARM sent a request for the following medication 1 week ago with no response. Patient states he also faxed a note to Dr. Elvera Lennox. Patient requests the following new RX:  MEDICATION: Fluconazole  PHARMACY:   [facility] - [place], [place] - [address] Phone:  [phone]  Fax:  [phone]       HAS THE PATIENT CONTACTED THEIR PHARMACY?  Yes  IS THIS A 90 DAY SUPPLY : No  IS PATIENT OUT OF MEDICATION: Yes  IF NOT; HOW MUCH IS LEFT: 0 LAST APPOINTMENT DATE: @2 /18/2022  NEXT APPOINTMENT DATE:@5 /07/2020  DO WE HAVE YOUR PERMISSION TO LEAVE A DETAILED MESSAGE?:Yes  OTHER COMMENTS:    **Let patient know to contact pharmacy at the end of the day to make sure medication is ready. **  ** Please notify patient to allow 48-72 hours to process**  **Encourage patient to contact the pharmacy for refills or they can request refills through [facility]**

## 2020-06-04 NOTE — Telephone Encounter (Signed)
Ok to send

## 2020-06-05 ENCOUNTER — Other Ambulatory Visit: Payer: Self-pay | Admitting: Family Medicine

## 2020-06-16 IMAGING — US US FNA BIOPSY THYROID 1ST LESION
1 series · 13 of 24 positions shown · non-contrast
Comparison: US Thyroid 01/22/19

MEDICATIONS:
10 cc 1% lidocaine

COMPLICATIONS:
None immediate.

INDICATION: Indeterminate thyroid nodule

Isthmus nodule: 1.9 cm
Right upper lobe thyroid nodule: 1.0 cm
EXAM:
ULTRASOUND GUIDED FINE NEEDLE ASPIRATION OF INDETERMINATE THYROID
NODULE
TECHNIQUE: Informed written consent was obtained from the patient after a
discussion of the risks, benefits and alternatives to treatment.
Questions regarding the procedure were encouraged and answered. A
timeout was performed prior to the initiation of the procedure.

[Series 1: us fna biopsy thyroid 1st lesion · 0.06mm/px · 24 acquisitions, 13 frames shown]
[im 1/24]
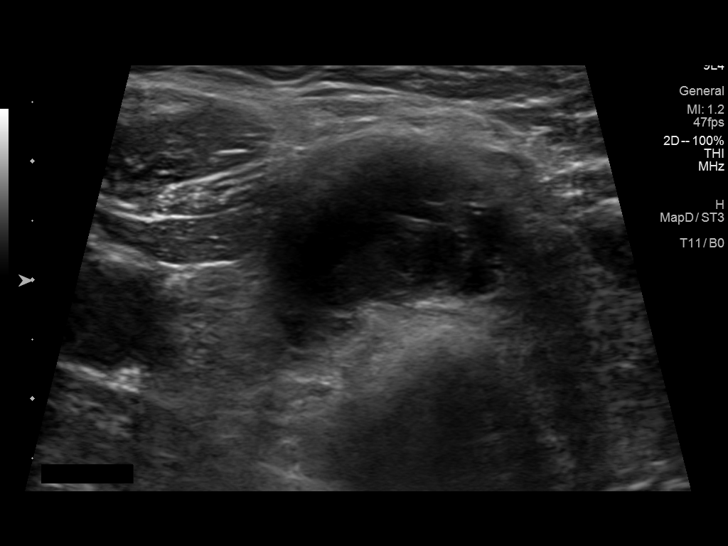
[im 3/24]
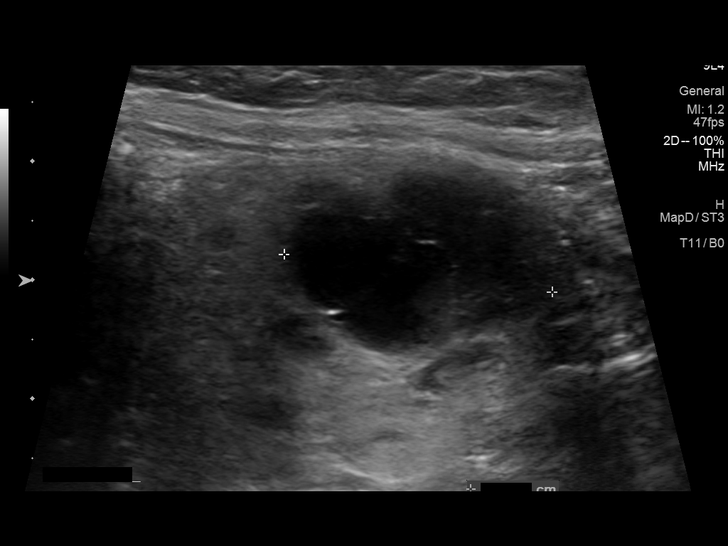
[im 5/24]
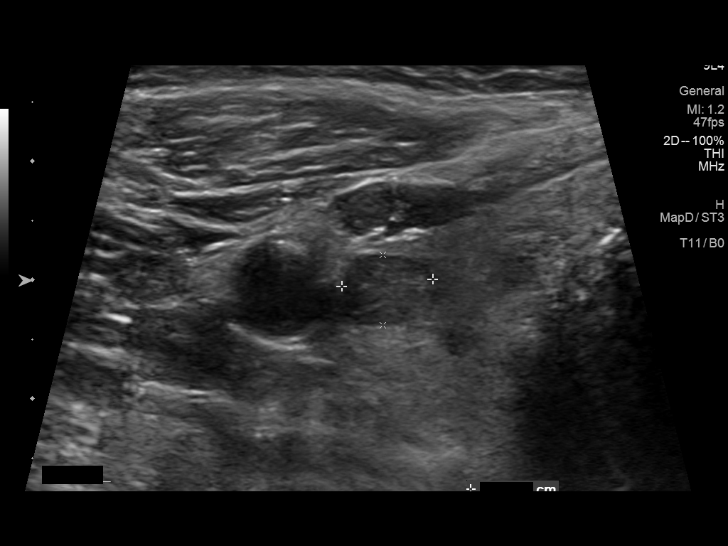
[im 7/24]
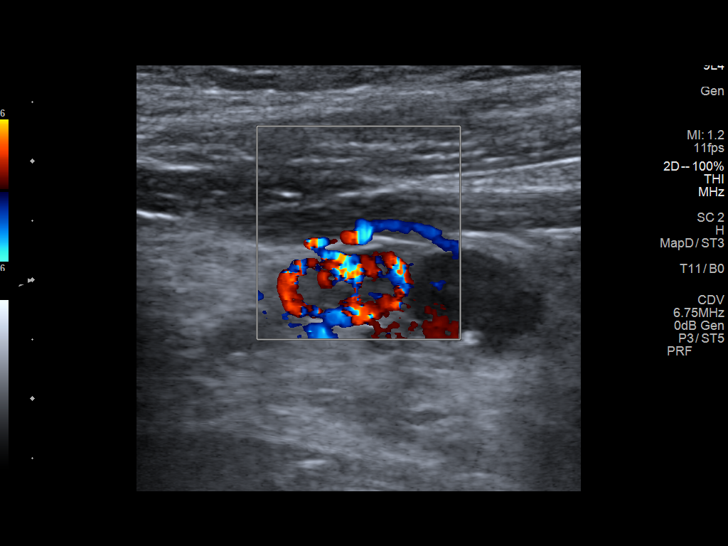
[im 9/24]
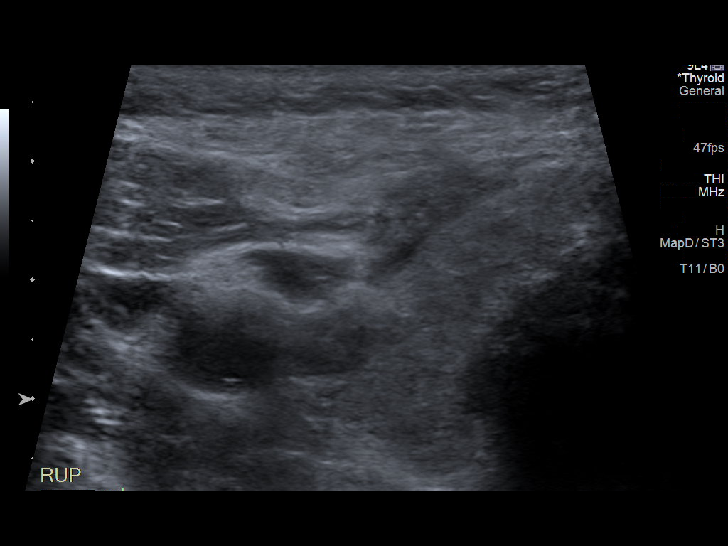
[im 11/24]
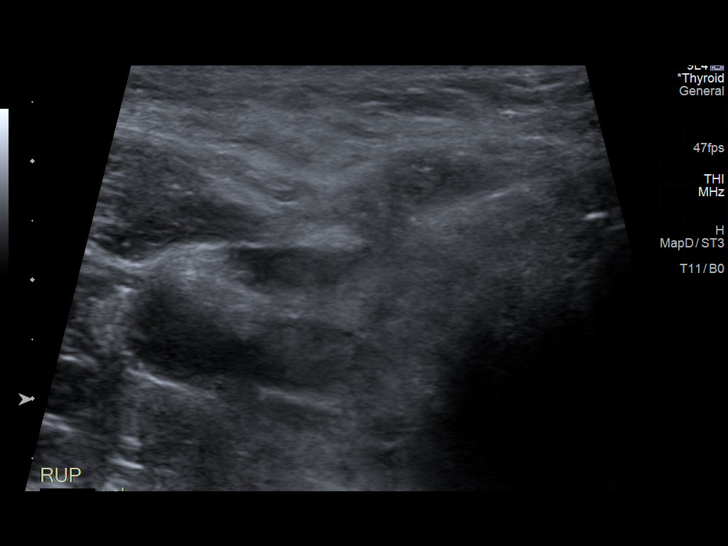
[im 13/24]
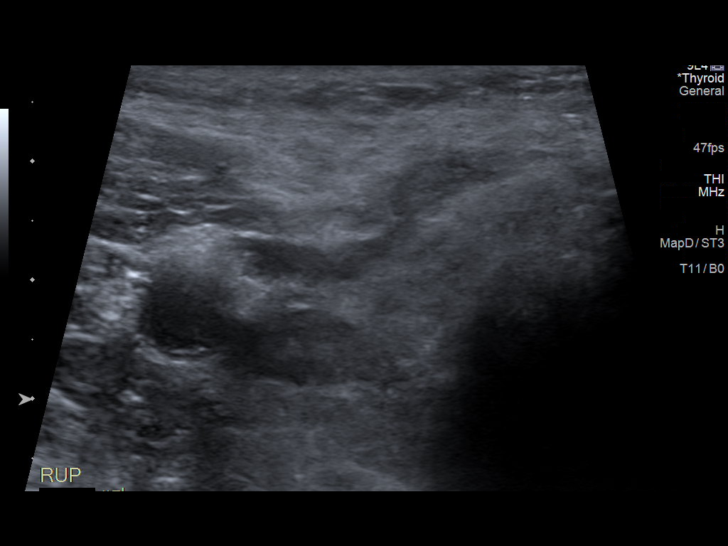
[im 14/24]
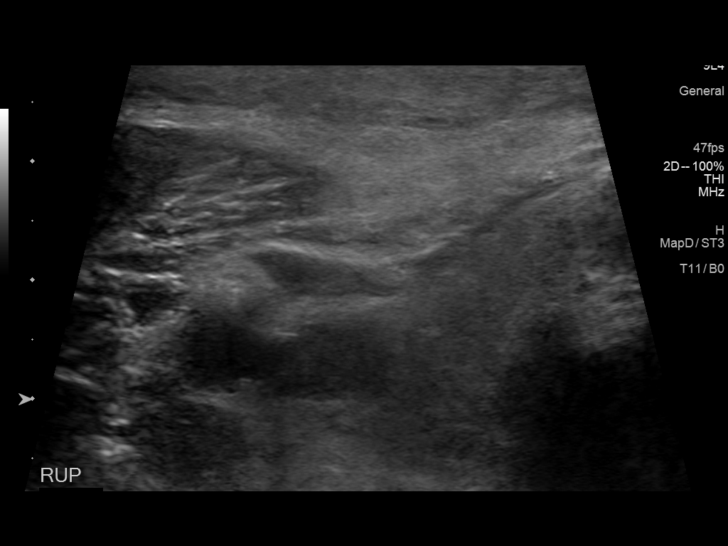
[im 16/24]
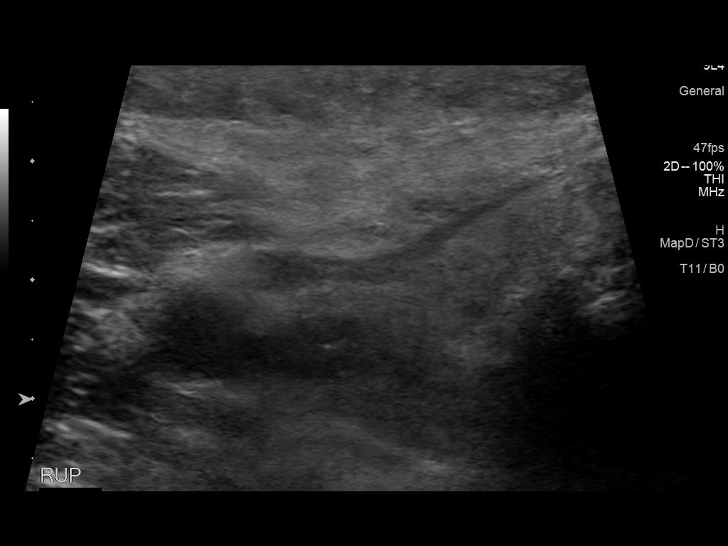
[im 18/24]
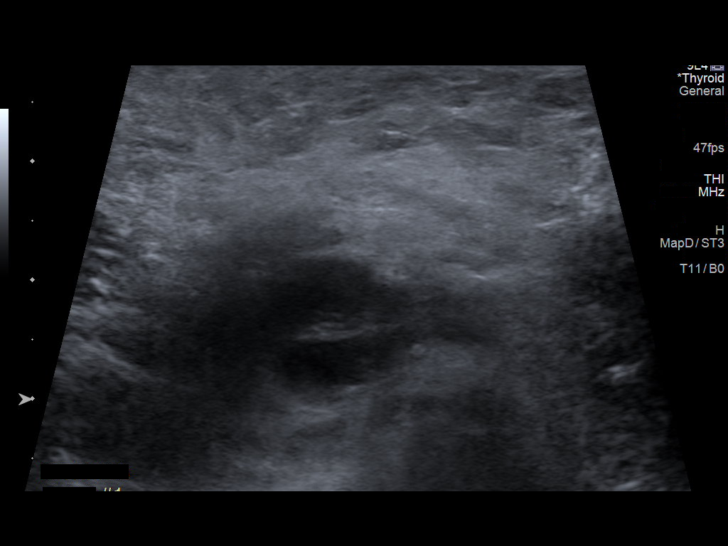
[im 20/24]
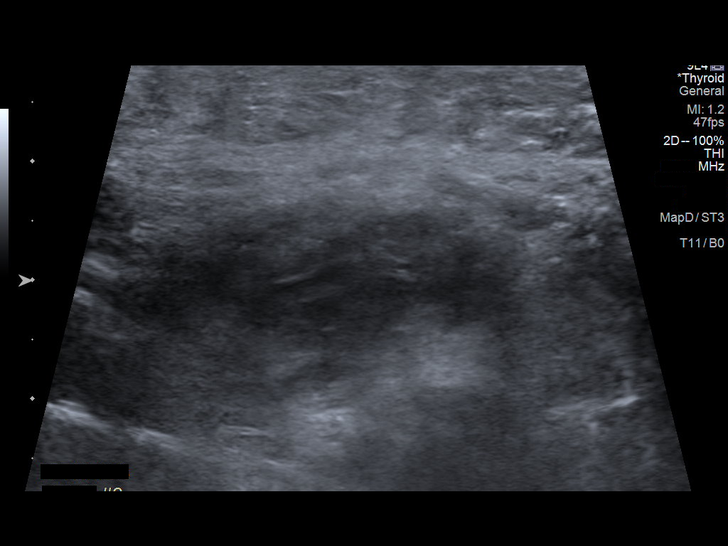
[im 22/24]
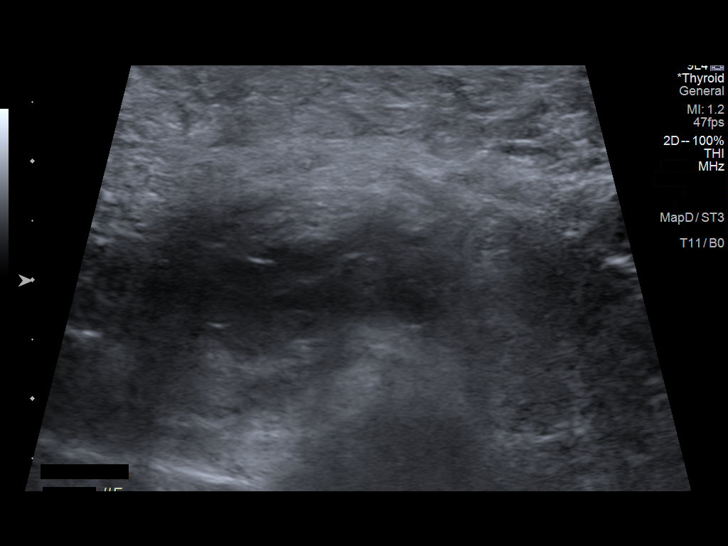
[im 24/24]
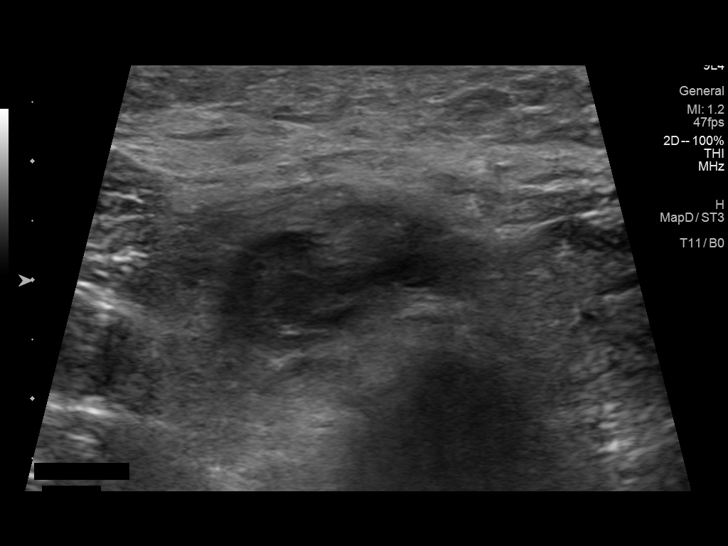

[13 of 24 positions shown; findings below may reference images not displayed]

Pre-procedural ultrasound scanning demonstrated unchanged size and
appearance of the indeterminate nodules within the Isthmus and Right
thyroid

The procedure was planned. The neck was prepped in the usual sterile
fashion, and a sterile drape was applied covering the operative
field. A timeout was performed prior to the initiation of the
procedure. Local anesthesia was provided with 1% lidocaine.

Under direct ultrasound guidance, 5 FNA biopsies were performed of
the isthmus nodule with a 25 gauge needle.

2 of these samples were obtained for AFIRMA per ordering VEA

Multiple ultrasound images were saved for procedural documentation
purposes. The samples were prepared and submitted to pathology.

Under direct ultrasound guidance, 5 FNA biopsies were performed of
the right superior thyroid nodule with a 25 gauge needle.

2 of these samples were obtained per ordering VEA

Multiple ultrasound images were saved for procedural documentation
purposes. The samples were prepared and submitted to pathology.

Limited post procedural scanning was negative for hematoma or
additional complication. Dressings were placed. The patient
tolerated the above procedures procedure well without immediate
postprocedural complication.
FINDINGS: Nodule reference number based on prior diagnostic ultrasound: 1

Maximum size: 1.9 cm

Location: Isthmus; Mid

ACR TI-RADS risk category: TR4 (4-6 points)

Reason for biopsy: meets ACR TI-RADS criteria

_________________________________________________________

Nodule reference number based on prior diagnostic ultrasound: 2

Maximum size: 1.0 cm

Location: Right; Superior

ACR TI-RADS risk category: TR5 (>/= 7 points)

Reason for biopsy: meets ACR TI-RADS criteria

Ultrasound imaging confirms appropriate placement of the needles
within the thyroid nodule.
IMPRESSION: 1. Technically successful ultrasound guided fine needle aspiration
of Isthmus nodule
2. Technically successful ultrasound guided fine needle aspiration
of right superior nodule

Read by

Nelson Lee Su

## 2020-06-23 ENCOUNTER — Telehealth: Payer: Self-pay | Admitting: Internal Medicine

## 2020-06-23 DIAGNOSIS — Z794 Long term (current) use of insulin: Secondary | ICD-10-CM

## 2020-06-23 NOTE — Telephone Encounter (Signed)
MEDICATION:  metFORMIN (GLUCOPHAGE) 1000 MG tablet  PHARMACY:   [facility] - [place], [place] - [address] Phone:  [phone]  Fax:  [phone]       HAS THE PATIENT CONTACTED THEIR PHARMACY?  Yes-PHARM stated to Patient they sent 3 RX requests on 06/18/20,06/19/20 and 06/22/20 with no response  IS THIS A 90 DAY SUPPLY : Yes  IS PATIENT OUT OF MEDICATION: Yes  IF NOT; HOW MUCH IS LEFT: 0  LAST APPOINTMENT DATE: @3 /01/2021  NEXT APPOINTMENT DATE:@5 /07/2020  DO WE HAVE YOUR PERMISSION TO LEAVE A DETAILED MESSAGE?: Yes  OTHER COMMENTS: Requests to have the above RX sent asap   **Let patient know to contact pharmacy at the end of the day to make sure medication is ready. **  ** Please notify patient to allow 48-72 hours to process**  **Encourage patient to contact the pharmacy for refills or they can request refills through [facility]**

## 2020-06-24 MED ORDER — METFORMIN HCL 1000 MG PO TABS: 1000.0000 mg | ORAL_TABLET | Freq: Two times a day (BID) | ORAL | 3 refills | Status: DC

## 2020-06-24 NOTE — Telephone Encounter (Signed)
Rx sent to preferred pharmacy.

## 2020-07-07 ENCOUNTER — Other Ambulatory Visit: Payer: Self-pay | Admitting: Family Medicine

## 2020-07-22 ENCOUNTER — Encounter: Payer: Self-pay | Admitting: Internal Medicine

## 2020-07-22 ENCOUNTER — Ambulatory Visit: Payer: BC Managed Care – PPO | Admitting: Internal Medicine

## 2020-07-22 ENCOUNTER — Other Ambulatory Visit: Payer: Self-pay

## 2020-07-22 VITALS — BP 128/82 | HR 91 | Ht 70.0 in | Wt 242.8 lb

## 2020-07-22 DIAGNOSIS — E1159 Type 2 diabetes mellitus with other circulatory complications: Secondary | ICD-10-CM | POA: Diagnosis not present

## 2020-07-22 DIAGNOSIS — Z794 Long term (current) use of insulin: Secondary | ICD-10-CM | POA: Diagnosis not present

## 2020-07-22 DIAGNOSIS — E669 Obesity, unspecified: Secondary | ICD-10-CM | POA: Diagnosis not present

## 2020-07-22 DIAGNOSIS — E042 Nontoxic multinodular goiter: Secondary | ICD-10-CM | POA: Diagnosis not present

## 2020-07-22 DIAGNOSIS — E66811 Obesity, class 1: Secondary | ICD-10-CM

## 2020-07-22 DIAGNOSIS — E785 Hyperlipidemia, unspecified: Secondary | ICD-10-CM

## 2020-07-22 NOTE — Progress Notes (Addendum)
Patient ID: Zachary Warner, male   DOB: [birth year], 65 y.o.   MRN: [id number]  This visit occurred during the SARS-CoV-2 public health emergency.  Safety protocols were in place, including screening questions prior to the visit, additional usage of staff PPE, and extensive cleaning of exam room while observing appropriate contact time as indicated for disinfecting solutions.   HPI: Zachary Warner is a 65 y.o.-year-old male, presenting for f/u for DM2, dx in ~1995, insulin-dependent since ~2005, uncontrolled, with complications (CAD - s/p AMI, DR). Last visit 4 months ago.  Interim history: At last OV, he was mother, who is sick (had fractures before last visit, she also has lung cancer).  Now his wife was dx'ed with lung and thyroid CA and he is taking care of her. Since last visit, he does not have new complaints: No increased urination, blurry vision, GI symptoms. He will soon switch to Medicare.  There are several changes that we have to make in his regimen after this.  DM2:  Reviewed HbA1c levels: 03/31/2020: HbA1c calculated from fructosamine is at goal, at 6.1%. Lab Results  Component Value Date   HGBA1C 7.2 (H) 03/03/2020   HGBA1C 8.6 (A) 06/24/2019   HGBA1C 7.6 (A) 01/10/2019  11/25/2019: HbA1c calculated from fructosamine is 6.2% 01/10/2019: HbA1c calculated from fructosamine 6.35% 04/09/2017: HbA1c calculated from fructosamine is 6.8% 11/28/2017:  Hba1c calculated from fructosamine is better: 6.45% 07/11/2017: HbA1c calculated from the fructosamine is 6.6%. 03/08/2017: HbA1c calculated from the fructosamine is 6.5%. 11/06/2016: HbA1c calculated from the fructosamine is slightly higher, at 6.6% 07/05/2016: HbA1c calculated from the fructosamine is 6.2% 04/05/2016: HbA1c calculated from fructosamine is excellent, at 6% 01/04/2016: HbA1c calculated from fructosamine is  6.55%. 11/10/2015: HbA1c calculated from fructosamine is 5.8%. 08/10/2015: HbA1c calculated from fructosamine is  7.05%. 04/09/2015: HbA1c calculated fructosamine (326): 7.1%, which is much more concordant with his sugar log.   He is on: - Metformin 1000 mg 2x a day. - Jardiance 25 mg daily before breakfast -he initially had yeast infections, now resolved - Ozempic 0.5 mg weekly-added 07/2019 >> 1 mg weekly - Tresiba U200  >> Toujeo 80 units daily - Humalog: 24 units before breakfast 36 units before lunch (increased 07/2018) 44-48 units before dinner  We stopped Januvia 2/2 large doses of mealtime insulin.  He checks his sugars 0 to once a day: - am: 120-175 >> 98-154, 163 >> 102-148, 158 >> 81-139 - 2h after b'fast: 189-251 >> 244 x1 >> n/c - lunch:   n/c >> 104-195 >> 126-160 >> n/c >> 130-177 - 2h after lunch: 130-189, 240 >> 89-142, 240 (Christmas) >> 137-190 - dinner: 137-207, 325 >> 120-198 >> 160 >> 80, 141-158 - 2h after dinner:  150, 189 >> 148-189, 300 >> 200 >> 160, 163 - bedtime:  n/c >> 194-291 >> 133-190 >> n/c >> 168-189 - nighttime:  45 (see below) >> n/c >> 200 >> n/c >> 81 Low sugar: 50 - at night ... >> 80 (golf) ; has hypoglycemia awareness in the 60s. Highest sugar was 368 ... >> 190.  He has a ReliOn meter.  Pt's meals are: - Breakfast: grits or sandwich - Lunch: sandwich - Dinner: chicken or beef or pork + vegetables - Snacks: 1 or 2 snacks  - crackers, peanuts or cookies  No CKD, last BUN/creatinine:  Lab Results  Component Value Date   BUN 19 03/03/2020   CREATININE 0.90 03/03/2020  On lisinopril.  + HL; last set of lipids: Lab Results  Component Value Date   CHOL 111 03/03/2020   HDL 36 (L) 03/03/2020   LDLCALC 51 03/03/2020   LDLDIRECT 59.0 08/26/2019   TRIG 159 (H) 03/03/2020   CHOLHDL 3.1 03/03/2020  On Lipitor 40 mg every day and fish oil 2 capsules twice a day  -Latest eye exam: 11/2017: + DR. [facility] - retired.  He will see Dr. Drucilla SchmidtHeadley. - no numbness and tingling in his feet.  He also has a history of  HTN, h/o nephrolithiasis x 3, last in 03/2013, OSA.   He had an MI 07/2014 >> had a stent placed. Cardiologist: Dr Sedonia SmallKrosowski. He is on supplementation with B12.  He also has a history of thyroid nodules:  Thyroid U/S (01/22/2019): Several nodules, of which 2 were dominant:  Narrative & Impression    Parenchymal Echotexture: Moderately heterogenous Isthmus: 1.0 cm Right lobe: 5.4 x 2.0 x 1.8 cm Left lobe: 4.7 x 2.4 x 2.0 cm _____________________________________  Estimated total number of nodules >/= 1 cm: 5 _____________________________________  Nodule # 1: Location: Isthmus; Mid Maximum size: 1.9 cm; Other 2 dimensions: 1.3 x 1.7 cm Composition: cannot determine (2) Echogenicity: hypoechoic (2) Echogenic foci: macrocalcifications (1) ACR TI-RADS total points: 5. **Given size (>/= 1.5 cm) and appearance, fine needle aspiration of this moderately suspicious nodule should be considered based on TI-RADS criteria. _________________________________________________________  Nodule # 2: Location: Right; Superior Maximum size: 1.0 cm; Other 2 dimensions: 0.6 x 0.7 cm Composition: solid/almost completely solid (2) Echogenicity: hypoechoic (2) Echogenic foci: punctate echogenic foci (3) ACR TI-RADS total points: 7. **Given size (>/= 1.0 cm) and appearance, fine needle aspiration of this highly suspicious nodule should be considered based on TI-RADS criteria. _________________________________________________________  Nodule #3 is a predominantly cystic nodule the mid right thyroid lobe that measures 1.1 x 0.8 x 0.8 cm.  Nodule #4 is a cystic nodule in the superior left thyroid lobe that measures 1.5 x 0.9 x 1.0 cm.  Nodule #5 is a mildly complex cystic nodule with septations in the mid left thyroid lobe that measures 1.2 x 1.0 x 1.3 cm.  Nodule # 6: Location: Left; Mid Maximum size: 0.8 cm; Other 2 dimensions: 0.6 x 0.5 cm Composition: mixed cystic and solid  (1) Echogenicity: isoechoic (1) Shape: taller-than-wide (3) ACR TI-RADS total points: 5. Given size (<0.9 cm) and appearance, this nodule does NOT meet TI-RADS criteria for biopsy or dedicated follow-up. _______________________________________  IMPRESSION: 1. Multinodular goiter. 2. Nodule #1 in the isthmus and nodule #2 in the superior right thyroid lobe both meet criteria for ultrasound-guided biopsy. 3. Multiple cystic nodules that do not meet criteria for biopsy.  The above is in keeping with the ACR TI-RADS recommendations - J Am Coll Radiol 2017;14:587-595.  Electronically Signed   By: Richarda OverlieAdam  Henn M.D.   On: 01/22/2019 17:11    Biopsies of the dominant nodules (02/13/2019): Benign:  Clinical History: Nodule 1 Isthmus Mid 1.9 cm; Other 2 dimensions: 1.3 x  1.7 cm, Hypoechoic, ACR TI-RADS total points: 5, Moderately suspicious  nodule  Specimen Submitted: A. THYROID ISTHMUS, FINE NEEDLE ASPIRATION:   FINAL MICROSCOPIC DIAGNOSIS:  - Consistent with benign follicular nodule (Bethesda category II)   SPECIMEN ADEQUACY:  Satisfactory for evaluation   CYTOLOGY - NON PAP  CASE: [id number]  PATIENT: Tramel Craine  Non-Gynecological Cytology Report   Clinical History: Nodule 2 Right Superior 1.0 cm; Other 2 dimensions:  0.6 x 0.7 cm, Solid almost completely solid, Hypoechoic, ACR TI-RADS  total points: 7, Highly suspicious nodule  Specimen Submitted: A. THYROID, RT LOBE RUP, FINE NEEDLE ASPIRATION:   FINAL MICROSCOPIC DIAGNOSIS:  - Consistent with benign follicular nodule (Bethesda category II)   SPECIMEN ADEQUACY:  Satisfactory for evaluation  Thyroid ultrasound (04/20/2020):  Parenchymal Echotexture: Moderately heterogeneous Isthmus: 0.8 cm Right lobe: 4.4 x 2.0 x 1.6 cm Left lobe: 4.8 x 2.0 x 1.8 cm _________________________________________________________  Nodule 1: 1.1 x 1.1 x 0.7 cm nodule located in the isthmus has decreased in size since prior  examination where it measured 1.9 x 1.7 x 1.3 cm. FNA of this nodule was performed on 02/12/2019. Please correlate with results.  Nodule 2: 1.0 x 0.9 x 0.6 cm nodule located in the superior right thyroid lobe is not significantly changed in size measuring 1.0 x 0.7 x 0.6 cm on the prior exam. FNA of this nodule was performed on 02/12/2019. Please correlate with results.  Nodule 3: 1.2 x 0.9 x 0.7 cm cystic nodule located in the mid right thyroid lobe is not significantly changed in size since prior exam where it measured 1.1 x 0.8 x 0.8 cm. It does not meet criteria for FNA or imaging follow-up.  Nodule 4: 0.6 x 0.6 x 0.3 cm solid hypoechoic nodule in the inferior right thyroid lobe is new since the prior examination. It does not meet criteria for FNA or imaging follow-up.  Nodule 5: 1.6 x 0.9 x 0.8 cm cystic nodule in the superior left thyroid lobe is not significantly changed in size since prior examination when where measured 1.5 x 1.0 x 0.9 cm. This does not meet criteria for FNA or imaging follow-up.  Nodule 6: 0.8 x 0.7 x 0.6 cm solid isoechoic nodule in the mid left thyroid lobe previously measured 1.2 x 1.0 x 1.3 cm. Interval decrease in size favors a benign etiology. This nodule does not meet criteria for imaging follow-up or FNA. _________________________________________________________  IMPRESSION: Bilateral thyroid nodules. Nodules 1 and 2 were previously biopsied. Please correlate with results. Remaining nodules do not meet criteria for FNA or imaging follow-up.  Pt denies: - feeling nodules in neck - hoarseness - dysphagia - choking - SOB with lying down  Latest TSH was normal: Lab Results  Component Value Date   TSH 1.14 03/31/2020   ROS: Constitutional: no weight gain/no weight loss, no fatigue, no subjective hyperthermia, no subjective hypothermia Eyes: no blurry vision, no xerophthalmia ENT: no sore throat, + see HPI Cardiovascular: no CP/no SOB/no  palpitations/no leg swelling Respiratory: no cough/no SOB/no wheezing Gastrointestinal: no N/no V/no D/no C/no acid reflux Musculoskeletal: no muscle aches/no joint aches Skin: no rashes, no hair loss Neurological: no tremors/no numbness/no tingling/no dizziness  I reviewed pt's medications, allergies, PMH, social hx, family hx, and changes were documented in the history of present illness. Otherwise, unchanged from my initial visit note.  Past Medical History:  Diagnosis Date  . Allergy   . Diabetes mellitus   . Hyperlipidemia   . Hypertension   . Myocardial infarction [facility])    May 2016  . Nephrolithiasis    3x 2007-2015, passed on his own  . Sleep apnea    wears CPAP   Past Surgical History:  Procedure Laterality Date  . CARDIAC CATHETERIZATION     stent May 2016  . COLONOSCOPY    . heart stent     May 2016  . HERNIA REPAIR     Social History   Socioeconomic History  . Marital status: Married  Spouse name: Not on file  . Number of children: Not on file  . Years of education: Not on file  . Highest education level: Not on file  Occupational History  . Not on file  Tobacco Use  . Smoking status: Never Smoker  . Smokeless tobacco: Never Used  Substance and Sexual Activity  . Alcohol use: No  . Drug use: No  . Sexual activity: Never  Other Topics Concern  . Not on file  Social History Narrative   Married. 2 children son and daughter. Granddaughter 12/02/2014 born and grandson 7/17//2018.       Works Administrator, sports: golfing (low to mid 75s)   Social Determinants of Corporate investment banker Strain: Not on file  Food Insecurity: Not on file  Transportation Needs: Not on file  Physical Activity: Not on file  Stress: Not on file  Social Connections: Not on file  Intimate Partner Violence: Not on file   Current Outpatient Medications on File Prior to Visit  Medication Sig Dispense Refill  . aspirin 81 MG tablet Take 81  mg by mouth daily.    Marland Kitchen atorvastatin (LIPITOR) 40 MG tablet Take 1 tablet (40 mg total) by mouth every other day. 15 tablet 3  . carvedilol (COREG) 3.125 MG tablet Take 1 tablet (3.125 mg total) by mouth 2 (two) times daily. 60 tablet 3  . empagliflozin (JARDIANCE) 25 MG TABS tablet Take 1 tablet (25 mg total) by mouth daily. 90 tablet 2  . EQ FIBER SUPPLEMENT PO Take by mouth.    . fluconazole (DIFLUCAN) 150 MG tablet Take 1 tablet (150 mg total) by mouth once. Refill in 3 days if not resolved. 1 tablet 1  . fluticasone (FLONASE) 50 MCG/ACT nasal spray Place into both nostrils daily as needed for allergies or rhinitis.    Marland Kitchen glucose blood test strip 1 each by Other route as needed. Use as instructed    . insulin aspart (NOVOLOG FLEXPEN) 100 UNIT/ML FlexPen Inject 28-48 Units into the skin 3 (three) times daily with meals. 45 mL 1  . insulin glargine, 2 Unit Dial, (TOUJEO MAX SOLOSTAR) 300 UNIT/ML Solostar Pen Inject 80 Units into the skin daily. 24 mL 3  . Insulin Pen Needle (CLICKFINE PEN NEEDLES) 32G X 4 MM MISC Use 3x a day 200 each 11  . lisinopril (ZESTRIL) 40 MG tablet Take 1 tablet (40 mg total) by mouth daily. 30 tablet 3  . metFORMIN (GLUCOPHAGE) 1000 MG tablet Take 1 tablet (1,000 mg total) by mouth 2 (two) times daily with a meal. 180 tablet 3  . nitroGLYCERIN (NITROSTAT) 0.4 MG SL tablet PLACE 1 TABLET UNDER TONGUE EVERY 5 MINUTES AS NEEDED FOR CHEST PAIN. CALL 911/DOCTOR IF YOU TAKE 2 TABLETS. max of 3 tablets/day. 25 tablet 3  . Omega-3 Fatty Acids (FISH OIL) 1000 MG CAPS Take 4 capsules by mouth daily.    . Semaglutide, 1 MG/DOSE, (OZEMPIC, 1 MG/DOSE,) 4 MG/3ML SOPN Inject 1 mg into the skin once a week. 9 mL 3  . vitamin B-12 (CYANOCOBALAMIN) 500 MCG tablet Take 500 mcg by mouth daily.     No current facility-administered medications on file prior to visit.   Allergies  Allergen Reactions  . Invokana [Canagliflozin] Other (See Comments)    Yeast infection   Family History   Problem Relation Age of Onset  . Coronary artery disease Father   . Liver disease Father  from heart meds per pt  . Diabetes Sister        grandmother  . Kidney disease Sister        dialysis from dm  . Diabetes Sister   . Other Sister        flash pulmonary edema led to death  . Colon cancer Neg Hx   . Esophageal cancer Neg Hx   . Rectal cancer Neg Hx   . Stomach cancer Neg Hx     PE: BP 128/82 (BP Location: Right Arm, Patient Position: Sitting, Cuff Size: Normal)   Pulse 91   Ht 5\' 10"  (1.778 m)   Wt 242 lb 12.8 oz (110.1 kg)   SpO2 96%   BMI 34.84 kg/m  Body mass index is 34.84 kg/m. Wt Readings from Last 3 Encounters:  07/22/20 242 lb 12.8 oz (110.1 kg)  03/31/20 244 lb 9.6 oz (110.9 kg)  03/03/20 245 lb 12.8 oz (111.5 kg)   Constitutional: overweight, in NAD Eyes: PERRLA, EOMI, no exophthalmos ENT: moist mucous membranes, no thyromegaly but left thyroid nodule felt on palpation, no cervical lymphadenopathy Cardiovascular: RRR, No MRG Respiratory: CTA B Gastrointestinal: abdomen soft, NT, ND, BS+ Musculoskeletal: no deformities, strength intact in all 4 Skin: moist, warm, no rashes Neurological: no tremor with outstretched hands, DTR normal in all 4    ASSESSMENT: 1. DM2, insulin-dependent, controlled, with complications - CAD, s/p AMi - DR  He does not have a history of pancreatitis or family history of medullary thyroid cancer.    2. HL  3.  Obesity  4.  Thyroid nodules  PLAN:  1. Patient with longstanding, fairly well-controlled type 2 diabetes, on a complex medication regimen with metformin, SGLT2 inhibitor, weekly GLP-1 receptor agonist and basal-bolus insulin regimen.  At last visit, HbA1c level was excellent (as calculated from fructosamine) at 6.1%.  Sugars were mostly at goal in the morning but fluctuating later in the day.  He was very busy with his mother who was sick and he also returned after the holidays.  We did not change his  regimen at that time but I did advise him to write down comments about the possible reasons for the abnormal blood sugars. -At today's visit, sugars appear to be slightly better than before, almost all at goal in the morning but slightly above target later in the day, especially before meals and after dinner.  It is unclear whether these checks are actually too close to a snack or to the previous meals so I again advised him to write down comments about the abnormal sugars in his log. -Otherwise, I do not feel that we have to change his regimen but we did discuss about changes that we will have to make after he switches to Medicare (see below, in bold) - I suggested to:  Patient Instructions  Please continue: - Metformin 1000 mg 2x a day. - Jardiance 25 mg daily before breakfast (Farxiga 10 mg)  - Ozempic 1 mg weekly (Trulicity 4.5 mg) - Toujeo 80 units daily - Humalog (NovoLog): 24 units before breakfast 36 units before lunch  44-48 units before dinner   Please write down comments about the abnormal blood sugars.  Please return in 4 months with your sugar log.  - we will check a fructosamine level today - advised to check sugars at different times of the day - 3-4x a day, rotating check times - advised for yearly eye exams >> he is not UTD - return to clinic in  4 months  2. HL -Reviewed latest lipid panel from 02/2020: LDL at goal, triglycerides only slightly high, HDL slightly low Lab Results  Component Value Date   CHOL 111 03/03/2020   HDL 36 (L) 03/03/2020   LDLCALC 51 03/03/2020   LDLDIRECT 59.0 08/26/2019   TRIG 159 (H) 03/03/2020   CHOLHDL 3.1 03/03/2020  -Continues Lipitor 40 mg daily and fish oil 2000 mg twice a day  3.  Obesity -continue SGLT 2 inhibitor and GLP-1 receptor agonist which should also help with weight loss -He lost 3 pounds before last visit, now lost 2-3 lbs since then  4.  Thyroid nodules -Felt on palpation -No neck compression symptoms -Latest  TSH was normal Lab Results  Component Value Date   TSH 1.14 03/31/2020  -I reviewed the results of his latest thyroid ultrasound: 2 dominant nodules, which were previously biopsied with benign results.  The rest of the nodules are small and not worrisome.  No follow-up is needed for these. -I plan to repeat another ultrasound in 2 to 3 years to follow up on the dominant nodules  Component     Latest Ref Rng & Units 07/22/2020  Fructosamine     205 - 285 umol/L 268  HbA1c calculated from fructosamine is 6.16%, excellent.  Carlus Pavlov, MD PhD [facility] Endocrinology

## 2020-07-22 NOTE — Patient Instructions (Signed)
Please continue: - Metformin 1000 mg 2x a day. - Jardiance 25 mg daily before breakfast (Farxiga 10 mg)  - Ozempic 1 mg weekly (Trulicity 4.5 mg) - Toujeo 80 units daily - Humalog (NovoLog): 24 units before breakfast 36 units before lunch  44-48 units before dinner   Please write down comments about the abnormal blood sugars.  Please return in 4 months with your sugar log.

## 2020-07-27 LAB — FRUCTOSAMINE: Fructosamine: 268 umol/L (ref 205–285)

## 2020-07-29 ENCOUNTER — Ambulatory Visit: Payer: BC Managed Care – PPO | Admitting: Internal Medicine

## 2020-08-30 LAB — HM DIABETES EYE EXAM

## 2020-09-02 ENCOUNTER — Encounter: Payer: Self-pay | Admitting: Family Medicine

## 2020-09-02 ENCOUNTER — Other Ambulatory Visit: Payer: Self-pay

## 2020-09-02 ENCOUNTER — Ambulatory Visit (INDEPENDENT_AMBULATORY_CARE_PROVIDER_SITE_OTHER): Payer: Medicare Other | Admitting: Family Medicine

## 2020-09-02 VITALS — BP 126/70 | HR 84 | Temp 98.0°F | Ht 70.0 in | Wt 242.4 lb

## 2020-09-02 DIAGNOSIS — E1159 Type 2 diabetes mellitus with other circulatory complications: Secondary | ICD-10-CM | POA: Diagnosis not present

## 2020-09-02 DIAGNOSIS — Z794 Long term (current) use of insulin: Secondary | ICD-10-CM | POA: Diagnosis not present

## 2020-09-02 DIAGNOSIS — I2510 Atherosclerotic heart disease of native coronary artery without angina pectoris: Secondary | ICD-10-CM

## 2020-09-02 DIAGNOSIS — E785 Hyperlipidemia, unspecified: Secondary | ICD-10-CM

## 2020-09-02 DIAGNOSIS — I10 Essential (primary) hypertension: Secondary | ICD-10-CM | POA: Diagnosis not present

## 2020-09-02 LAB — COMPREHENSIVE METABOLIC PANEL
ALT: 18 U/L (ref 0–53)
AST: 16 U/L (ref 0–37)
Albumin: 4.6 g/dL (ref 3.5–5.2)
Alkaline Phosphatase: 50 U/L (ref 39–117)
BUN: 20 mg/dL (ref 6–23)
CO2: 29 mEq/L (ref 19–32)
Calcium: 9.8 mg/dL (ref 8.4–10.5)
Chloride: 103 mEq/L (ref 96–112)
Creatinine, Ser: 1.01 mg/dL (ref 0.40–1.50)
GFR: 78.28 mL/min (ref >60.00)
Glucose, Bld: 97 mg/dL (ref 70–99)
Potassium: 4.7 mEq/L (ref 3.5–5.1)
Sodium: 139 mEq/L (ref 135–145)
Total Bilirubin: 0.7 mg/dL (ref 0.2–1.2)
Total Protein: 7.7 g/dL (ref 6.0–8.3)

## 2020-09-02 LAB — LDL CHOLESTEROL, DIRECT: Direct LDL: 72 mg/dL

## 2020-09-02 LAB — HEMOGLOBIN A1C: Hgb A1c MFr Bld: 7.1 % — ABNORMAL HIGH (ref 4.6–6.5)

## 2020-09-02 NOTE — Patient Instructions (Addendum)
Health Maintenance Due  Topic Date Due   Pneumococcal Vaccine/prevnar 20-  Eligible, Currently out in office. Never done   COVID-19 Vaccine (3 - Pfizer risk series) Unsure , He will check his covid card and let us know when he received 07/28/2019   We will call you within two weeks about your referral to Dr. Bing Matter (now with [facility]). If you do not hear within 2 weeks, give [place] a call.   Please stop by lab before you go If you have mychart- we will send your results within 3 business days of [place] receiving them.  If you do not have mychart- we will call you about results within 5 business days of [place] receiving them.  *please also note that you will see labs on mychart as soon as they post. I will later go in and write notes on them- will say "notes from Dr. Durene Cal"  Recommended follow up: Return in about 6 months (around 03/04/2021) for physical or sooner if needed.

## 2020-09-02 NOTE — Progress Notes (Signed)
Phone [phone] In person visit   Subjective:   Zachary Warner is a 65 y.o. year old very pleasant male patient who presents for/with See problem oriented charting Chief Complaint  Patient presents with   Hyperlipidemia   Hypertension   This visit occurred during the SARS-CoV-2 public health emergency.  Safety protocols were in place, including screening questions prior to the visit, additional usage of staff PPE, and extensive cleaning of exam room while observing appropriate contact time as indicated for disinfecting solutions.   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     atorvastatin (LIPITOR) 40 MG tablet Take 1 tablet (40 mg total) by mouth every other day. 15 tablet 3   carvedilol (COREG) 3.125 MG tablet Take 1 tablet (3.125 mg total) by mouth 2 (two) times daily. 60 tablet 3   empagliflozin (JARDIANCE) 25 MG TABS tablet Take 1 tablet (25 mg total) by mouth daily. 90 tablet 2   EQ FIBER SUPPLEMENT PO Take by mouth.     fluconazole (DIFLUCAN) 150 MG tablet Take 1 tablet (150 mg total) by mouth once. Refill in 3 days if not resolved. 1 tablet 1   fluticasone (FLONASE) 50 MCG/ACT nasal spray Place  into both nostrils daily as needed for allergies or rhinitis.     glucose blood test strip 1 each by Other route as needed. Use as instructed     insulin aspart (NOVOLOG FLEXPEN) 100 UNIT/ML FlexPen Inject 28-48 Units into the skin 3 (three) times daily with meals. 45 mL 1   insulin glargine, 2 Unit Dial, (TOUJEO MAX SOLOSTAR) 300 UNIT/ML Solostar Pen Inject 80 Units into the skin daily. 24 mL 3   Insulin Pen Needle (CLICKFINE PEN NEEDLES) 32G X 4 MM MISC Use 3x a day 200 each 11   lisinopril (ZESTRIL) 40 MG tablet Take 1 tablet (40 mg total) by mouth daily. 30 tablet 3   metFORMIN (GLUCOPHAGE) 1000 MG tablet Take 1 tablet (1,000 mg total) by mouth 2 (two) times daily with a meal. 180 tablet 3   nitroGLYCERIN (NITROSTAT) 0.4 MG SL tablet PLACE 1 TABLET UNDER TONGUE EVERY 5 MINUTES AS NEEDED FOR CHEST PAIN. CALL 911/DOCTOR IF YOU TAKE 2 TABLETS. max of 3 tablets/day. 25 tablet 3   Omega-3 Fatty Acids (FISH OIL) 1000 MG CAPS Take 4 capsules by mouth daily.     Semaglutide, 1 MG/DOSE, (OZEMPIC, 1 MG/DOSE,) 4 MG/3ML SOPN Inject 1 mg into the skin once a week. 9 mL 3   vitamin B-12 (CYANOCOBALAMIN) 500 MCG tablet Take 500 mcg by mouth daily.     No current facility-administered medications for this  visit.     Objective:  BP 126/70   Pulse 84   Temp 98 F (36.7 C) (Temporal)   Ht 5\' 10"  (1.778 m)   Wt 242 lb 6.4 oz (110 kg)   SpO2 97%   BMI 34.78 kg/m  Gen: NAD, resting comfortably CV: RRR no murmurs rubs or gallops Lungs: CTAB no crackles, wheeze, rhonchi Abdomen: soft/nontender/nondistended. No rebound or guarding.  Ext: minimal edema Skin: warm, dry     Assessment and Plan  #Social Updates- His wife, Zachary Warner, was diagnosed with lung cancer and thyroid cancer- lung cancer from CPAP machine- She had surgery on the 18th  and recovered well.. He semi-retired on Monday-3 days a week for 6.5 hours a day.  Thyroid cancer  #hypertension S: compliant with coreg 3.125mg  BID, lisinopril  40mg   Home readings: average 130 systolic BP Readings from Last 3 Encounters:  09/02/20 126/70  07/22/20 128/82  03/31/20 110/72  A/P:  Stable. Continue current medications.    #hyperlipidemia S: previously mild poorly controlled on atorvastatin 40mg  every other day with LDL above 70. On 02/24/2019 we increased dose to 40 mg daily -Tried to go to daily- his Rx got changed and he went out so he went back to every other day- about a couple of months ago Lab Results  Component Value Date   CHOL 111 03/03/2020   HDL 36 (L) 03/03/2020   LDLCALC 51 03/03/2020   LDLDIRECT 59.0 08/26/2019   TRIG 159 (H) 03/03/2020   CHOLHDL 3.1 03/03/2020   A/P: Unknown control now back down to atorvastatin every other day-CMP and Direct LDL today.  If LDL is above 70 with cardiac history would increase medication back to daily  # Diabetes with history of diabetic retinopathy ollowed closely by Dr. 10/26/2019. Fructosamine more relevant for him- we do a1c when we see him primarily for metrics.  This has been well managed by Dr. Gherghe/controlled-we will check A1c with labs today  # CAD- follows with Dr. 14/10/2019 cardiology most recently but prior cardiologist Dr. 14/10/2019 was a better fit for him S:compliant with aspirin and statin. Asymptomatic with no chest pain or shortness of breath . Saw cardiology last 1-2 years ago. #1 stent 2015 A/P: Patient remains asymptomatic-continue current medications.  Patient had a benefit with Dr. Garner Nash previously-Dr. Bing Matter is now within the [facility] and we opted to refer him back  #Discussed Prevnar 20- will plan for next visit.  Recommended follow up: Return in about 6 months (around 03/04/2021) for physical or sooner if needed. Future Appointments  Date Time Provider Department Center  11/22/2020  9:40 AM [id number] None    Lab/Order associations:   ICD-10-CM   1. Hyperlipidemia, unspecified hyperlipidemia type  E78.5  Comprehensive metabolic panel    LDL cholesterol, direct    2. Essential hypertension  I10 Comprehensive metabolic panel    LDL cholesterol, direct    3. Coronary artery disease involving native coronary artery of native heart without angina pectoris  I25.10 Comprehensive metabolic panel    LDL cholesterol, direct    Ambulatory referral to Cardiology    4. Type 2 diabetes mellitus with other circulatory complication, with long-term current use of insulin (HCC)  E11.59 Hemoglobin A1c   Z79.4      I,Harris Phan,acting as a scribe for 14/11/2020, MD.,have documented all relevant documentation on the behalf of 11/24/2020, MD,as directed by  Carlus Pavlov, MD while in the presence of [name], MD.  I, [name], MD, have reviewed all documentation for this visit. The documentation on 09/02/20 for the exam, diagnosis, procedures, and orders are all accurate and complete.   Return precautions advised.  [name], MD

## 2020-09-22 ENCOUNTER — Other Ambulatory Visit: Payer: Self-pay | Admitting: Internal Medicine

## 2020-09-22 ENCOUNTER — Telehealth: Payer: Self-pay | Admitting: *Deleted

## 2020-09-22 ENCOUNTER — Other Ambulatory Visit: Payer: Self-pay | Admitting: *Deleted

## 2020-09-22 DIAGNOSIS — Z794 Long term (current) use of insulin: Secondary | ICD-10-CM

## 2020-09-22 MED ORDER — EMPAGLIFLOZIN 25 MG PO TABS: 25.0000 mg | ORAL_TABLET | Freq: Every day | ORAL | 2 refills | Status: DC

## 2020-09-22 NOTE — Telephone Encounter (Signed)
Sent Rx Humalog--to [facility].

## 2020-09-22 NOTE — Telephone Encounter (Signed)
[facility]--stated pt novolog not cover by insurance but Humolog will be covered. Please advise

## 2020-10-06 ENCOUNTER — Other Ambulatory Visit: Payer: Self-pay | Admitting: Internal Medicine

## 2020-10-21 ENCOUNTER — Other Ambulatory Visit: Payer: Self-pay | Admitting: Family Medicine

## 2020-10-28 ENCOUNTER — Other Ambulatory Visit: Payer: Self-pay | Admitting: Internal Medicine

## 2020-10-28 DIAGNOSIS — Z794 Long term (current) use of insulin: Secondary | ICD-10-CM

## 2020-10-28 DIAGNOSIS — E1159 Type 2 diabetes mellitus with other circulatory complications: Secondary | ICD-10-CM

## 2020-11-22 ENCOUNTER — Ambulatory Visit (INDEPENDENT_AMBULATORY_CARE_PROVIDER_SITE_OTHER): Payer: Medicare Other | Admitting: Internal Medicine

## 2020-11-22 ENCOUNTER — Encounter: Payer: Self-pay | Admitting: Internal Medicine

## 2020-11-22 ENCOUNTER — Other Ambulatory Visit: Payer: Self-pay

## 2020-11-22 VITALS — BP 120/82 | HR 84 | Ht 70.0 in | Wt 242.2 lb

## 2020-11-22 DIAGNOSIS — E785 Hyperlipidemia, unspecified: Secondary | ICD-10-CM

## 2020-11-22 DIAGNOSIS — Z794 Long term (current) use of insulin: Secondary | ICD-10-CM | POA: Diagnosis not present

## 2020-11-22 DIAGNOSIS — E1159 Type 2 diabetes mellitus with other circulatory complications: Secondary | ICD-10-CM

## 2020-11-22 DIAGNOSIS — E042 Nontoxic multinodular goiter: Secondary | ICD-10-CM

## 2020-11-22 DIAGNOSIS — E669 Obesity, unspecified: Secondary | ICD-10-CM

## 2020-11-22 LAB — POCT GLYCOSYLATED HEMOGLOBIN (HGB A1C): Hemoglobin A1C: 7.5 % — AB (ref 4.0–5.6)

## 2020-11-22 MED ORDER — INSULIN LISPRO (1 UNIT DIAL) 100 UNIT/ML (KWIKPEN): PEN_INJECTOR | SUBCUTANEOUS | 3 refills | Status: DC

## 2020-11-22 MED ORDER — NOVOLOG FLEXPEN 100 UNIT/ML ~~LOC~~ SOPN: 10.0000 [IU] | PEN_INJECTOR | Freq: Three times a day (TID) | SUBCUTANEOUS | 3 refills | Status: DC

## 2020-11-22 NOTE — Patient Instructions (Signed)
Please continue: - Metformin 1000 mg 2x a day. - Jardiance 25 mg daily before breakfast  - Toujeo 80 units daily  Please restart: - Ozempic 1 mg weekly  If sugars remain above goal, restart: - Humalog 10-20 units before meals  Please write down comments about the abnormal blood sugars.  Please return in 4 months with your sugar log.

## 2020-11-22 NOTE — Progress Notes (Signed)
Patient ID: Zachary Warner, male   DOB: 1956/03/09, 65 y.o.   MRN: [id number]  This visit occurred during the SARS-CoV-2 public health emergency.  Safety protocols were in place, including screening questions prior to the visit, additional usage of staff PPE, and extensive cleaning of exam room while observing appropriate contact time as indicated for disinfecting solutions.   HPI: Zachary Warner is a 65 y.o.-year-old male, presenting for f/u for DM2, dx in ~1995, insulin-dependent since ~2005, uncontrolled, with complications (CAD - s/p AMI, DR). Last visit 4 months ago.  Interim history: His wife was diagnosed with lung CA and thyroid ds. (Not CA).  He is taking care of her -significant stress. No increased urination, blurry vision, nausea, chest pain.  He has shoulder pain after playing golf this weekend. He just switched to Medicare, already entered on consult. He was out of Ozempic for 5 weeks - backorder at the pharmacy. 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   DM2:  Reviewed HbA1c levels: Lab Results  Component Value Date   HGBA1C 7.1 (H) 09/02/2020   HGBA1C 7.2 (H) 03/03/2020   HGBA1C 8.6 (A) 06/24/2019  07/22/2020: HbA1c calculated from fructosamine is 6.16%, excellent. 03/31/2020: HbA1c calculated from fructosamine is at goal, at 6.1%. 11/25/2019: HbA1c calculated from fructosamine is 6.2% 01/10/2019: HbA1c calculated from fructosamine 6.35% 04/09/2017: HbA1c calculated from fructosamine is 6.8% 11/28/2017:  Hba1c calculated from fructosamine is better: 6.45% 07/11/2017: HbA1c calculated from the fructosamine is 6.6%. 03/08/2017: HbA1c calculated from the fructosamine is 6.5%. 11/06/2016: HbA1c calculated from the fructosamine is slightly higher, at 6.6% 07/05/2016: HbA1c calculated from the fructosamine is 6.2% 04/05/2016: HbA1c calculated from fructosamine is excellent, at 6% 01/04/2016: HbA1c calculated from fructosamine is   6.55%. 11/10/2015: HbA1c calculated from fructosamine is 5.8%. 08/10/2015: HbA1c calculated from fructosamine is 7.05%. 04/09/2015: HbA1c calculated fructosamine (326): 7.1%, which is much more concordant with his sugar log.   He is on: - Metformin 1000 mg 2x a day. - Jardiance 25 mg daily before breakfast -he initially had yeast infections, now resolved - Ozempic 0.5 mg weekly-added 07/2019 >> 1 mg weekly - off for the last 5 weeks (backorder) - Tresiba U200  >> Toujeo 80 units daily - Humalog: - ran out 1 week ago, replaced it with Basaglar (!!!) 24 units before breakfast 36 units before lunch (increased 07/2018) 44-48 units before dinner  We stopped Januvia 2/2 large doses of mealtime insulin.  He checks his sugars 0 to once a day: - am: 120-175 >> 98-154, 163 >> 102-148, 158 >> 81-139 >> 79-128 - 2h after b'fast: 189-251 >> 244 x1 >> n/c - lunch:   n/c >> 104-195 >> 126-160 >> n/c >> 130-177 >> 138 - 2h after lunch:  89-142, 240 (Christmas) >> 137-190 >> 158, 160 - dinner: 137-207, 325 >> 120-198 >> 160 >> 80, 141-158 >> 149-164 - 2h after dinner:  150, 189 >> 148-189, 300 >> 200 >> 160, 163 >> n/c - bedtime:  n/c >> 194-291 >> 133-190 >> n/c >> 168-189 >> n/c - nighttime:  45 (see below) >> n/c >> 200 >> n/c >> 81 >> 90 Low sugar: 50 - at night ... >> 80 (golf)  >> 79; has hypoglycemia awareness in the 60s. Highest sugar was 368 ... >> 190 >> 164.  He has a ReliOn meter.  Pt's meals are: - Breakfast: grits or sandwich - Lunch: sandwich - Dinner: chicken or beef  or pork + vegetables - Snacks: 1 or 2 snacks  - crackers, peanuts or cookies  No CKD, last BUN/creatinine:  Lab Results  Component Value Date   BUN 20 09/02/2020   CREATININE 1.01 09/02/2020  On lisinopril.  + HL; last set of lipids: Lab Results  Component Value Date   CHOL 111 03/03/2020   HDL 36 (L) 03/03/2020   LDLCALC 51 03/03/2020   LDLDIRECT 72.0 09/02/2020   TRIG 159 (H) 03/03/2020   CHOLHDL 3.1  03/03/2020  On Lipitor 40 mg every day and fish oil 2 capsules twice a day  -Latest eye exam: 08/2020: No DR, prev.+ DR. He was seeing Dr Ambrose Mantle [facility] - retired.  Now Dr. Drucilla Schmidt.  - no numbness and tingling in his feet.  He also has a history of HTN, h/o nephrolithiasis x 3, last in 03/2013, OSA.   He had an MI 07/2014 >> had a stent placed. Cardiologist: Dr Sedonia Small. He is on supplementation with B12.  He also has a history of thyroid nodules:  Thyroid U/S (01/22/2019): Several nodules, of which 2 were dominant:  Narrative & Impression    Parenchymal Echotexture: Moderately heterogenous Isthmus: 1.0 cm Right lobe: 5.4 x 2.0 x 1.8 cm Left lobe: 4.7 x 2.4 x 2.0 cm _____________________________________   Estimated total number of nodules >/= 1 cm: 5 _____________________________________   Nodule # 1: Location: Isthmus; Mid Maximum size: 1.9 cm; Other 2 dimensions: 1.3 x 1.7 cm Composition: cannot determine (2) Echogenicity: hypoechoic (2) Echogenic foci: macrocalcifications (1) ACR TI-RADS total points: 5.  **Given size (>/= 1.5 cm) and appearance, fine needle aspiration of this moderately suspicious nodule should be considered based on TI-RADS criteria. _________________________________________________________   Nodule # 2: Location: Right; Superior Maximum size: 1.0 cm; Other 2 dimensions: 0.6 x 0.7 cm Composition: solid/almost completely solid (2) Echogenicity: hypoechoic (2) Echogenic foci: punctate echogenic foci (3) ACR TI-RADS total points: 7.  **Given size (>/= 1.0 cm) and appearance, fine needle aspiration of this highly suspicious nodule should be considered based on TI-RADS criteria. _________________________________________________________   Nodule #3 is a predominantly cystic nodule the mid right thyroid lobe that measures 1.1 x 0.8 x 0.8 cm.   Nodule #4 is a cystic nodule in the superior left thyroid lobe that measures 1.5 x 0.9 x 1.0  cm.   Nodule #5 is a mildly complex cystic nodule with septations in the mid left thyroid lobe that measures 1.2 x 1.0 x 1.3 cm.   Nodule # 6: Location: Left; Mid Maximum size: 0.8 cm; Other 2 dimensions: 0.6 x 0.5 cm Composition: mixed cystic and solid (1) Echogenicity: isoechoic (1) Shape: taller-than-wide (3) ACR TI-RADS total points: 5. Given size (<0.9 cm) and appearance, this nodule does NOT meet TI-RADS criteria for biopsy or dedicated follow-up. _______________________________________   IMPRESSION: 1. Multinodular goiter. 2. Nodule #1 in the isthmus and nodule #2 in the superior right thyroid lobe both meet criteria for ultrasound-guided biopsy. 3. Multiple cystic nodules that do not meet criteria for biopsy.   The above is in keeping with the ACR TI-RADS recommendations - J Am Coll Radiol 2017;14:587-595.   Electronically Signed   By: Richarda Overlie M.D.   On: 01/22/2019 17:11    Biopsies of the dominant nodules (02/13/2019): Benign:  Clinical History: Nodule 1 Isthmus Mid 1.9 cm; Other 2 dimensions: 1.3 x  1.7 cm, Hypoechoic, ACR TI-RADS total points: 5, Moderately suspicious  nodule  Specimen Submitted:  A. THYROID ISTHMUS, FINE NEEDLE ASPIRATION:  FINAL MICROSCOPIC DIAGNOSIS:  - Consistent with benign follicular nodule (Bethesda category II)   SPECIMEN ADEQUACY:  Satisfactory for evaluation   CYTOLOGY - NON PAP  CASE: [id number]  PATIENT: Johnpaul Rideaux  Non-Gynecological Cytology Report   Clinical History: Nodule 2 Right Superior 1.0 cm; Other 2 dimensions:  0.6 x 0.7 cm, Solid almost completely solid, Hypoechoic, ACR TI-RADS  total points: 7, Highly suspicious  nodule  Specimen Submitted:  A. THYROID, RT LOBE RUP, FINE NEEDLE ASPIRATION:   FINAL MICROSCOPIC DIAGNOSIS:  - Consistent with benign follicular nodule (Bethesda category II)   SPECIMEN ADEQUACY:  Satisfactory for evaluation  Thyroid ultrasound (04/20/2020):  Parenchymal Echotexture:  Moderately heterogeneous Isthmus: 0.8 cm Right lobe: 4.4 x 2.0 x 1.6 cm Left lobe: 4.8 x 2.0 x 1.8 cm _________________________________________________________  Nodule 1: 1.1 x 1.1 x 0.7 cm nodule located in the isthmus has decreased in size since prior examination where it measured 1.9 x 1.7 x 1.3 cm. FNA of this nodule was performed on 02/12/2019. Please correlate with results.  Nodule 2: 1.0 x 0.9 x 0.6 cm nodule located in the superior right thyroid lobe is not significantly changed in size measuring 1.0 x 0.7 x 0.6 cm on the prior exam. FNA of this nodule was performed on 02/12/2019. Please correlate with results.  Nodule 3: 1.2 x 0.9 x 0.7 cm cystic nodule located in the mid right thyroid lobe is not significantly changed in size since prior exam where it measured 1.1 x 0.8 x 0.8 cm. It does not meet criteria for FNA or imaging follow-up.  Nodule 4: 0.6 x 0.6 x 0.3 cm solid hypoechoic nodule in the inferior right thyroid lobe is new since the prior examination. It does not meet criteria for FNA or imaging follow-up.  Nodule 5: 1.6 x 0.9 x 0.8 cm cystic nodule in the superior left thyroid lobe is not significantly changed in size since prior examination when where measured 1.5 x 1.0 x 0.9 cm. This does not meet criteria for FNA or imaging follow-up.  Nodule 6: 0.8 x 0.7 x 0.6 cm solid isoechoic nodule in the mid left thyroid lobe previously measured 1.2 x 1.0 x 1.3 cm. Interval decrease in size favors a benign etiology. This nodule does not meet criteria for imaging follow-up or FNA. _________________________________________________________  IMPRESSION: Bilateral thyroid nodules. Nodules 1 and 2 were previously biopsied. Please correlate with results. Remaining nodules do not meet criteria for FNA or imaging follow-up.  Pt denies: - feeling nodules in neck - hoarseness - dysphagia - choking - SOB with lying down  Latest TSH was normal: Lab Results  Component  Value Date   TSH 1.14 03/31/2020   ROS: + See HPI  I reviewed pt's medications, allergies, PMH, social hx, family hx, and changes were documented in the history of present illness. Otherwise, unchanged from my initial visit note.  Past Medical History:  Diagnosis Date   Allergy    Diabetes mellitus    Hyperlipidemia    Hypertension    Myocardial infarction [facility])    May 2016   Nephrolithiasis    3x 2007-2015, passed on his own   Sleep apnea    wears CPAP   Past Surgical History:  Procedure Laterality Date   CARDIAC CATHETERIZATION     stent May 2016   COLONOSCOPY     heart stent     May 2016   HERNIA REPAIR     Social History   Socioeconomic History   Marital status:  Married    Spouse name: Not on file   Number of children: Not on file   Years of education: Not on file   Highest education level: Not on file  Occupational History   Not on file  Tobacco Use   Smoking status: Never   Smokeless tobacco: Never  Substance and Sexual Activity   Alcohol use: No   Drug use: No   Sexual activity: Never  Other Topics Concern   Not on file  Social History Narrative   Married. 2 children son and daughter. Granddaughter 12/02/2014 born and grandson 7/17//2018.       Works Administrator, sportsmanaging electric motor repair shop      Hobbies: golfing (low to mid 6380s)   Social Determinants of Corporate investment bankerHealth   Financial Resource Strain: Not on file  Food Insecurity: Not on file  Transportation Needs: Not on file  Physical Activity: Not on file  Stress: Not on file  Social Connections: Not on file  Intimate Partner Violence: Not on file   Current Outpatient Medications on File Prior to Visit  Medication Sig Dispense Refill   aspirin 81 MG tablet Take 81 mg by mouth daily.     atorvastatin (LIPITOR) 40 MG tablet Take 1 tablet (40 mg total) by mouth every other day. 15 tablet 3   carvedilol (COREG) 3.125 MG tablet Take 1 tablet (3.125 mg total) by mouth 2 (two) times daily. 60 tablet 3   EQ FIBER  SUPPLEMENT PO Take by mouth.     fluconazole (DIFLUCAN) 150 MG tablet Take 1 tablet (150 mg total) by mouth once. Refill in 3 days if not resolved. 1 tablet 0   fluticasone (FLONASE) 50 MCG/ACT nasal spray Place into both nostrils daily as needed for allergies or rhinitis.     glucose blood test strip 1 each by Other route as needed. Use as instructed     insulin aspart (NOVOLOG FLEXPEN) 100 UNIT/ML FlexPen Inject 28-48 Units into the skin 3 (three) times daily with meals. 45 mL 1   insulin lispro (HUMALOG) 100 UNIT/ML KwikPen INJECT 28-48 UNITS SUBCUTANEOUSLY THREE TIMES DAILY 30 mL 5   Insulin Pen Needle (CLICKFINE PEN NEEDLES) 32G X 4 MM MISC Use 3x a day 200 each 11   JARDIANCE 25 MG TABS tablet Take 1 tablet (25 mg total) by mouth daily. 90 tablet 5   lisinopril (ZESTRIL) 40 MG tablet Take 1 tablet (40 mg total) by mouth daily. 30 tablet 3   metFORMIN (GLUCOPHAGE) 1000 MG tablet Take 1 tablet (1,000 mg total) by mouth 2 (two) times daily with a meal. 180 tablet 3   nitroGLYCERIN (NITROSTAT) 0.4 MG SL tablet PLACE 1 TABLET UNDER TONGUE EVERY 5 MINUTES AS NEEDED FOR CHEST PAIN. CALL 911/DOCTOR IF YOU TAKE 2 TABLETS. max of 3 tablets/day. 25 tablet 3   Omega-3 Fatty Acids (FISH OIL) 1000 MG CAPS Take 4 capsules by mouth daily.     Semaglutide, 1 MG/DOSE, (OZEMPIC, 1 MG/DOSE,) 4 MG/3ML SOPN Inject 1 mg into the skin once a week. 9 mL 3   TOUJEO MAX SOLOSTAR 300 UNIT/ML Solostar Pen Inject 80 Units into the skin daily. 24 mL 3   vitamin B-12 (CYANOCOBALAMIN) 500 MCG tablet Take 500 mcg by mouth daily.     No current facility-administered medications on file prior to visit.   Allergies  Allergen Reactions   Invokana [Canagliflozin] Other (See Comments)    Yeast infection   Family History  Problem Relation Age of Onset  Coronary artery disease Father    Liver disease Father        from heart meds per pt   Diabetes Sister        grandmother   Kidney disease Sister        dialysis from dm    Diabetes Sister    Other Sister        flash pulmonary edema led to death   Colon cancer Neg Hx    Esophageal cancer Neg Hx    Rectal cancer Neg Hx    Stomach cancer Neg Hx     PE: BP 120/82 (BP Location: Right Arm, Patient Position: Sitting, Cuff Size: Normal)   Pulse 84   Ht 5\' 10"  (1.778 m)   Wt 242 lb 3.2 oz (109.9 kg)   SpO2 98%   BMI 34.75 kg/m  Body mass index is 34.75 kg/m. Wt Readings from Last 3 Encounters:  11/22/20 242 lb 3.2 oz (109.9 kg)  09/02/20 242 lb 6.4 oz (110 kg)  07/22/20 242 lb 12.8 oz (110.1 kg)   Constitutional: overweight, in NAD Eyes: PERRLA, EOMI, no exophthalmos ENT: moist mucous membranes, no thyromegaly but left thyroid nodule felt on palpation, no cervical lymphadenopathy Cardiovascular: RRR, No MRG Respiratory: CTA B Gastrointestinal: abdomen soft, NT, ND, BS+ Musculoskeletal: no deformities, strength intact in all 4 Skin: moist, warm, no rashes Neurological: no tremor with outstretched hands, DTR normal in all 4   ASSESSMENT: 1. DM2, insulin-dependent, controlled, with complications - CAD, s/p AMi - DR  He does not have a history of pancreatitis or family history of medullary thyroid cancer.    2. HL  3.  Obesity class I  4.  Thyroid nodules  PLAN:  1. Patient with longstanding, fairly well-controlled type 2 diabetes, on a complex medication regimen with metformin, SGLT2 inhibitor, weekly GLP-1 receptor agonist and basal-bolus insulin regimen.  HbA1c calculated from fructosamine remains excellent, at 6.16% at last visit.  Sugars were slightly better at last visit compared to previously, almost all at goal in the morning but slightly above target later in the day, especially before meals and after dinner.  It was unclear whether he was checking his Premeal values after a snack or closer to previous meals so I again advised him to write down comments about the abnormal blood sugars in his log.  We did not change his regimen at that  time.  We discussed that we may need to make changes in his regimen after he switched to Medicare. -At today's visit, he tells me that he has been off Ozempic for approximately 5 weeks at the pharmacy was on backorder and they could not obtain it.  At today's visit, we called the pharmacy and this is back in stock so he can stop and pick it up.  However, unfortunately, he also ran out of Humalog approximately a week ago and took 07/24/20 approximately 20 units instead of 8.  We discussed that Hospital doctor is also along acting insulin, just like Toujeo and he should not use this interchangeably with Humalog or NovoLog.  I advised him to let me know if he runs out of his medicines again.   -At today's visit, we reviewed his sugars at home and they are in fact mostly at goal with exception as above started before lunch and dinner.  Therefore, for now, I advised him to add Ozempic back at 1 mg weekly but only restart Humalog or NovoLog (whichever cover) at a lower dose and only  if sugars remain high after starting Ozempic. - I suggested to:  Patient Instructions  Please continue: - Metformin 1000 mg 2x a day. - Jardiance 25 mg daily before breakfast  - Toujeo 80 units daily  Please restart: - Ozempic 1 mg weekly  If sugars remain above goal, restart: - Humalog 10-20 units before meals  Please write down comments about the abnormal blood sugars.  Please return in 4 months with your sugar log.  - HbA1c was slightly higher, at 7.5%; we will check a fructosamine level at next visit - advised to check sugars at different times of the day - 3-4x a day, rotating check times - advised for yearly eye exams >> he is UTD - return to clinic in 4 months  2. HL -Reviewed latest lipid panel from 02/2020: LDL at goal, triglycerides only slightly high, HDL slightly low: Lab Results  Component Value Date   CHOL 111 03/03/2020   HDL 36 (L) 03/03/2020   LDLCALC 51 03/03/2020   LDLDIRECT 72.0 09/02/2020   TRIG  159 (H) 03/03/2020   CHOLHDL 3.1 03/03/2020  -He continues on Lipitor 40 mg daily and fish oil 2000 mg twice a day  3.  Obesity class I -continue SGLT 2 inhibitor and will restart the GLP-1 receptor agonist which should also help with weight loss -He lost 2 to 3 pounds before last visit and weight stable now  4.  Thyroid nodules -Initially felt on palpation -No neck compression symptoms -Latest TSH was reviewed and this was normal: Lab Results  Component Value Date   TSH 1.14 03/31/2020  -I reviewed the results of his latest thyroid ultrasound: 2 dominant nodules, which were previously biopsied with benign results.  The rest of the nodules are small and not worrisome.  No follow-up is needed for these -Plan to repeat another ultrasound 2 to 3 years from the previous to follow-up on the dominant nodules  Carlus Pavlov, MD PhD [facility] Endocrinology

## 2020-11-22 NOTE — Addendum Note (Signed)
Addended by: Kenyon Ana on: 11/22/2020 11:48 AM   Modules accepted: Orders

## 2020-12-02 DIAGNOSIS — T7840XA Allergy, unspecified, initial encounter: Secondary | ICD-10-CM | POA: Insufficient documentation

## 2020-12-03 ENCOUNTER — Ambulatory Visit: Payer: Medicare Other | Admitting: Cardiology

## 2020-12-03 ENCOUNTER — Encounter: Payer: Self-pay | Admitting: Cardiology

## 2020-12-03 ENCOUNTER — Other Ambulatory Visit: Payer: Self-pay

## 2020-12-03 VITALS — BP 130/80 | HR 90 | Ht 70.0 in | Wt 242.6 lb

## 2020-12-03 DIAGNOSIS — Z9989 Dependence on other enabling machines and devices: Secondary | ICD-10-CM

## 2020-12-03 DIAGNOSIS — E1159 Type 2 diabetes mellitus with other circulatory complications: Secondary | ICD-10-CM

## 2020-12-03 DIAGNOSIS — I252 Old myocardial infarction: Secondary | ICD-10-CM

## 2020-12-03 DIAGNOSIS — I2510 Atherosclerotic heart disease of native coronary artery without angina pectoris: Secondary | ICD-10-CM

## 2020-12-03 DIAGNOSIS — E785 Hyperlipidemia, unspecified: Secondary | ICD-10-CM

## 2020-12-03 DIAGNOSIS — G4733 Obstructive sleep apnea (adult) (pediatric): Secondary | ICD-10-CM

## 2020-12-03 NOTE — Addendum Note (Signed)
Addended by: Roosvelt Harps R on: 12/03/2020 11:33 AM   Modules accepted: Orders

## 2020-12-03 NOTE — Patient Instructions (Signed)
Medication Instructions:  Your physician recommends that you continue on your current medications as directed. Please refer to the Current Medication list given to you today.  *If you need a refill on your cardiac medications before your next appointment, please call your pharmacy*   Lab Work: Your physician recommends that you return for lab work in: TODAY Lipids, AST, ALT If you have labs (blood work) drawn today and your tests are completely normal, you will receive your results only by: . MyChart Message (if you have MyChart) OR . A paper copy in the mail If you have any lab test that is abnormal or we need to change your treatment, we will call you to review the results.   Testing/Procedures: Your physician has requested that you have an echocardiogram. Echocardiography is a painless test that uses sound waves to create images of your heart. It provides your doctor with information about the size and shape of your heart and how well your heart's chambers and valves are working. This procedure takes approximately one hour. There are no restrictions for this procedure.     Follow-Up: At [facility], you and your health needs are our priority.  As part of our continuing mission to provide you with exceptional heart care, we have created designated Provider Care Teams.  These Care Teams include your primary Cardiologist (physician) and Advanced Practice Providers (APPs -  Physician Assistants and Nurse Practitioners) who all work together to provide you with the care you need, when you need it.  We recommend signing up for the patient portal called "MyChart".  Sign up information is provided on this After Visit Summary.  MyChart is used to connect with patients for Virtual Visits (Telemedicine).  Patients are able to view lab/test results, encounter notes, upcoming appointments, etc.  Non-urgent messages can be sent to your provider as well.   To learn more about what you can do with  MyChart, go to [email or web address].    Your next appointment:   6 month(s)  The format for your next appointment:   In Person  Provider:   Robert Krasowski, MD   Other Instructions   

## 2020-12-03 NOTE — Progress Notes (Signed)
Cardiology Consultation:    Date:  12/03/2020   ID:  Sydney, Azure [birth year], MRN [id number]  PCP:  Shelva Majestic, MD  Cardiologist:  Gypsy Balsam, MD   Referring MD: Shelva Majestic, MD   Chief Complaint  Patient presents with   Establish Care  I would like to be reestablished as a patient in the practice  History of Present Illness:    Zachary Warner is a 65 y.o. male who is being seen today for the evaluation of history of coronary artery disease at the request of Shelva Majestic, MD. past medical history significant for longstanding diabetes, essential hypertension, dyslipidemia in May 2016 he suffered from myocardial infarction.  He required PTCA and stenting of the obtuse marginal branch.  He also does have mild to moderate disease in LAD at that time.  He is to follow-up with me however in 2018 he stopped following up.  He followed by a group from [facility].  He would like to be reestablished with patient my practice.  He is doing very well.  He is trying to be active he is semiretired he does not work Monday Tuesday rest of the week he works only from 10:00 until 4:00.  He played golf on the regular basis he walks he has no difficulty doing it.  He is wife is a Engineer, civil (consulting) and she is trying to help him with management of diabetes and he seems to be doing quite well with this.  He denies have any chest pain tightness squeezing pressure burning chest no palpitations no dizziness no swelling of lower extremities. He does not smoke He is not on any special diet but to watch what he eats He is trying to be active but does not have any structured exercise program  Past Medical History:  Diagnosis Date   Allergy    Diabetes mellitus    Hyperlipidemia    Hypertension    Myocardial infarction [facility])    May 2016   Nephrolithiasis    3x 2007-2015, passed on his own   Sleep apnea    wears CPAP    Past Surgical History:  Procedure Laterality Date   CARDIAC  CATHETERIZATION     stent May 2016   COLONOSCOPY     heart stent     May 2016   HERNIA REPAIR      Current Medications: Current Meds  Medication Sig   aspirin 81 MG tablet Take 81 mg by mouth daily.   atorvastatin (LIPITOR) 40 MG tablet Take 1 tablet (40 mg total) by mouth every other day.   carvedilol (COREG) 3.125 MG tablet Take 1 tablet (3.125 mg total) by mouth 2 (two) times daily.   EQ FIBER SUPPLEMENT PO Take 1 tablet by mouth daily. Unknown strength   fluconazole (DIFLUCAN) 150 MG tablet Take 1 tablet (150 mg total) by mouth once. Refill in 3 days if not resolved. (Patient taking differently: Take 150 mg by mouth once. Take 1 tablet (150 mg total) by mouth once. Refill in 3 days if not resolved.)   fluticasone (FLONASE) 50 MCG/ACT nasal spray Place 1 spray into both nostrils daily as needed for allergies or rhinitis.   insulin aspart (NOVOLOG FLEXPEN) 100 UNIT/ML FlexPen Inject 10-20 Units into the skin 3 (three) times daily with meals.   insulin lispro (HUMALOG) 100 UNIT/ML KwikPen Inject 10-20 Units into the skin 3 (three) times daily with meals. (Patient taking differently: Inject 20 Units into the skin  as directed. Inject 10-20 Units into the skin 3 (three) times daily with meals.)   JARDIANCE 25 MG TABS tablet Take 1 tablet (25 mg total) by mouth daily. (Patient taking differently: Take 25 mg by mouth daily.)   lisinopril (ZESTRIL) 40 MG tablet Take 1 tablet (40 mg total) by mouth daily.   metFORMIN (GLUCOPHAGE) 1000 MG tablet Take 1 tablet (1,000 mg total) by mouth 2 (two) times daily with a meal.   nitroGLYCERIN (NITROSTAT) 0.4 MG SL tablet PLACE 1 TABLET UNDER TONGUE EVERY 5 MINUTES AS NEEDED FOR CHEST PAIN. CALL 911/DOCTOR IF YOU TAKE 2 TABLETS. max of 3 tablets/day. (Patient taking differently: Place 0.4 mg under the tongue every 5 (five) minutes as needed for chest pain.)   Omega-3 Fatty Acids (FISH OIL) 1000 MG CAPS Take 4 capsules by mouth daily.   Semaglutide, 1 MG/DOSE,  (OZEMPIC, 1 MG/DOSE,) 4 MG/3ML SOPN Inject 1 mg into the skin once a week.   TOUJEO MAX SOLOSTAR 300 UNIT/ML Solostar Pen Inject 80 Units into the skin daily.   vitamin B-12 (CYANOCOBALAMIN) 500 MCG tablet Take 500 mcg by mouth daily.     Allergies:   Invokana [canagliflozin]   Social History   Socioeconomic History   Marital status: Married    Spouse name: Not on file   Number of children: Not on file   Years of education: Not on file   Highest education level: Not on file  Occupational History   Not on file  Tobacco Use   Smoking status: Never   Smokeless tobacco: Never  Substance and Sexual Activity   Alcohol use: No   Drug use: No   Sexual activity: Never  Other Topics Concern   Not on file  Social History Narrative   Married. 2 children son and daughter. Granddaughter 12/02/2014 born and grandson 7/17//2018.       Works Administrator, sports: golfing (low to mid 10s)   Social Determinants of Corporate investment banker Strain: Not on file  Food Insecurity: Not on file  Transportation Needs: Not on file  Physical Activity: Not on file  Stress: Not on file  Social Connections: Not on file     Family History: The patient's family history includes Coronary artery disease in his father; Diabetes in his sister and sister; Kidney disease in his sister; Liver disease in his father; Other in his sister. There is no history of Colon cancer, Esophageal cancer, Rectal cancer, or Stomach cancer. ROS:   Please see the history of present illness.    All 14 point review of systems negative except as described per history of present illness.  EKGs/Labs/Other Studies Reviewed:    The following studies were reviewed today: I did review record from before including cardiac catheterization  EKG:  EKG is  ordered today.  The ekg ordered today demonstrates normal sinus rhythm, normal P interval, normal QS complex duration morphology no ST segment  changes  Recent Labs: 03/03/2020: Hemoglobin 14.6; Platelets 193 03/31/2020: TSH 1.14 09/02/2020: ALT 18; BUN 20; Creatinine, Ser 1.01; Potassium 4.7; Sodium 139  Recent Lipid Panel    Component Value Date/Time   CHOL 111 03/03/2020 0952   TRIG 159 (H) 03/03/2020 0952   TRIG 176 (H) 01/29/2006 1110   HDL 36 (L) 03/03/2020 0952   CHOLHDL 3.1 03/03/2020 0952   VLDL 49.0 (H) 02/24/2019 1110   LDLCALC 51 03/03/2020 0952   LDLDIRECT 72.0 09/02/2020 0905  Physical Exam:    VS:  BP 130/80 (BP Location: Left Arm, Patient Position: Sitting)   Pulse 90   Ht 5\' 10"  (1.778 m)   Wt 242 lb 9.6 oz (110 kg)   SpO2 98%   BMI 34.81 kg/m     Wt Readings from Last 3 Encounters:  12/03/20 242 lb 9.6 oz (110 kg)  11/22/20 242 lb 3.2 oz (109.9 kg)  09/02/20 242 lb 6.4 oz (110 kg)     GEN:  Well nourished, well developed in no acute distress HEENT: Normal NECK: No JVD; No carotid bruits LYMPHATICS: No lymphadenopathy CARDIAC: RRR, no murmurs, no rubs, no gallops RESPIRATORY:  Clear to auscultation without rales, wheezing or rhonchi  ABDOMEN: Soft, non-tender, non-distended MUSCULOSKELETAL:  No edema; No deformity  SKIN: Warm and dry NEUROLOGIC:  Alert and oriented x 3 PSYCHIATRIC:  Normal affect   ASSESSMENT:    1. Coronary artery disease involving native coronary artery of native heart without angina pectoris   2. OSA on CPAP   3. Type 2 diabetes mellitus with other circulatory complication, without long-term current use of insulin (HCC)   4. Dyslipidemia    PLAN:    In order of problems listed above:  Coronary disease status post PTCA and stenting in 2016 of obtuse marginal branch.  Looks like he is asymptomatic and well managed.  He is on antiplatelet therapy which I will continue, he is also on high intensity statin.  We will continue that management.  I will ask him to have an echocardiogram done to assess left ventricle ejection fraction since he did have myocardial infarction.   Luckily, his EKG did not show any Q waves.  He did not I will continue with beta-blocker and ACE inhibitor. Obstructive sleep apnea he does use CPAP on the regular basis. Type 2 diabetes follow-up excellently with his latest hemoglobin A1c of 7.5 from November 22, 2020 he admits that still a bit elevated but now he is trying to tighten control of diabetes hopefully this number will be better. Dyslipidemia I did review his K PN which show me his LDL of 51 and HDL 36 this is from December H 2021.  He is on high intense statin form of Lipitor 40 which I will continue we will check his fasting lipid profile today. We did talk about healthy lifestyle including exercises on the regular basis I advised him to walk every single day for about half an hour.  He said he get plenty of time he would be able to do that.  I encouraged him to keep playing golf.  We did talk about good diet and eating habits.  Overall I find him in pretty good shape.  I see him back in my office in 6 months   Medication Adjustments/Labs and Tests Ordered: Current medicines are reviewed at length with the patient today.  Concerns regarding medicines are outlined above.  No orders of the defined types were placed in this encounter.  No orders of the defined types were placed in this encounter.   Signed, 09-12-1979, MD, [facility]. 12/03/2020 9:04 AM    [facility]

## 2020-12-03 NOTE — Addendum Note (Signed)
Addended by: Delorse Limber I on: 12/03/2020 09:18 AM   Modules accepted: Orders

## 2020-12-04 LAB — LIPID PANEL
Chol/HDL Ratio: 3.9 ratio (ref 0.0–5.0)
Cholesterol, Total: 114 mg/dL (ref 100–199)
HDL: 29 mg/dL — ABNORMAL LOW (ref >39)
LDL Chol Calc (NIH): 59 mg/dL (ref 0–99)
Triglycerides: 152 mg/dL — ABNORMAL HIGH (ref 0–149)
VLDL Cholesterol Cal: 26 mg/dL (ref 5–40)

## 2020-12-04 LAB — AST: AST: 13 IU/L (ref 0–40)

## 2020-12-04 LAB — ALT: ALT: 14 IU/L (ref 0–44)

## 2020-12-09 ENCOUNTER — Telehealth: Payer: Self-pay | Admitting: Internal Medicine

## 2020-12-09 NOTE — Telephone Encounter (Signed)
We can try Farxiga 10 mg daily

## 2020-12-09 NOTE — Telephone Encounter (Signed)
Pt calling in voiced that he was recently placed on medicare and the jardiance is to expensive for patient and pt was wondering if he can get a alternative for this medication. Pt would like a call back    [facility] - [place], [place] - [address]

## 2020-12-10 NOTE — Telephone Encounter (Signed)
Patient is okay with sending the Zachary Warner and seeing if it more affordable

## 2020-12-10 NOTE — Telephone Encounter (Signed)
OK, please send 90 tablets with 3 refills.

## 2020-12-14 MED ORDER — DAPAGLIFLOZIN PROPANEDIOL 10 MG PO TABS: 10.0000 mg | ORAL_TABLET | Freq: Every day | ORAL | 3 refills | Status: DC

## 2020-12-14 NOTE — Telephone Encounter (Signed)
Script has been sent.

## 2020-12-15 ENCOUNTER — Other Ambulatory Visit: Payer: Self-pay

## 2020-12-15 ENCOUNTER — Ambulatory Visit (INDEPENDENT_AMBULATORY_CARE_PROVIDER_SITE_OTHER): Payer: Medicare Other

## 2020-12-15 DIAGNOSIS — I252 Old myocardial infarction: Secondary | ICD-10-CM

## 2020-12-15 LAB — ECHOCARDIOGRAM COMPLETE
Area-P 1/2: 4.86 cm2
Calc EF: 49.3 %
S' Lateral: 3.2 cm
Single Plane A2C EF: 46.2 %
Single Plane A4C EF: 51.4 %

## 2020-12-16 ENCOUNTER — Telehealth: Payer: Self-pay

## 2020-12-16 NOTE — Telephone Encounter (Signed)
Patient called.  Left message for patient to call back.

## 2020-12-16 NOTE — Telephone Encounter (Signed)
-----   Message from Robert J Krasowski, MD sent at 12/16/2020  1:34 PM EDT ----- Echocardiogram showed preserved left ventricular ejection fraction, mild calcification of the aortic valve, mild enlargement of the aorta 39 mm.  All of this medical therapy 

## 2020-12-17 ENCOUNTER — Telehealth: Payer: Self-pay

## 2020-12-17 NOTE — Telephone Encounter (Signed)
Left message on patients voicemail to please return our call.   

## 2020-12-17 NOTE — Telephone Encounter (Signed)
-----   Message from Robert J Krasowski, MD sent at 12/16/2020  1:34 PM EDT ----- Echocardiogram showed preserved left ventricular ejection fraction, mild calcification of the aortic valve, mild enlargement of the aorta 39 mm.  All of this medical therapy 

## 2020-12-20 ENCOUNTER — Telehealth: Payer: Self-pay

## 2020-12-20 NOTE — Telephone Encounter (Signed)
-----   Message from Georgeanna Lea, MD sent at 12/16/2020  1:34 PM EDT ----- Echocardiogram showed preserved left ventricular ejection fraction, mild calcification of the aortic valve, mild enlargement of the aorta 39 mm.  All of this medical therapy

## 2020-12-20 NOTE — Telephone Encounter (Signed)
Spoke with patient regarding results and recommendation.  Patient verbalizes understanding and is agreeable to plan of care. Advised patient to call back with any issues or concerns.  

## 2021-01-31 ENCOUNTER — Telehealth: Payer: Self-pay | Admitting: Internal Medicine

## 2021-01-31 NOTE — Telephone Encounter (Signed)
Pt has no more Jardiance and is in the "donut hole" for insurance and would like to know if we have any samples to hold him over til the middle of December (6weeks worth).   Pt contact [phone]

## 2021-02-01 NOTE — Telephone Encounter (Addendum)
LVM for pt to let him know that he can go online and print off the application for [employer] Patient Assistance Program and fill all of his portion out and bring in a copy of his ID and his income and we fill take care of the rest. I also let him know that this program is income based. If he does qualify he would be notified by mail and his medication would be shipped to our office and he will be notified when to pick up.  I will place 4 boxes of jardiance 25mg  up front for pt to pick up

## 2021-02-02 NOTE — Telephone Encounter (Signed)
Pt picked up samples

## 2021-02-17 ENCOUNTER — Other Ambulatory Visit: Payer: Self-pay | Admitting: Family Medicine

## 2021-03-14 ENCOUNTER — Other Ambulatory Visit: Payer: Self-pay | Admitting: Internal Medicine

## 2021-03-14 DIAGNOSIS — E1159 Type 2 diabetes mellitus with other circulatory complications: Secondary | ICD-10-CM

## 2021-03-14 NOTE — Progress Notes (Signed)
Phone: [phone]   Subjective:  Patient presents today for their annual physical. Chief complaint-noted.   See problem oriented charting- ROS- full  review of systems was completed and negative  except for: shoulder issues at times- has seen therapist in past- still doing exercises, seasonal allergies, lower appetite overall- gradual weight loss as result- doing a better job with eating  The following were reviewed and entered/updated in epic: Past Medical History:  Diagnosis Date   Allergy    Diabetes mellitus    Hyperlipidemia    Hypertension    Myocardial infarction [facility])    May 2016   Nephrolithiasis    3x 2007-2015, passed on his own   Sleep apnea    wears CPAP   Patient Active Problem List   Diagnosis Date Noted   Diabetic retinopathy (HCC) 02/12/2017    Priority: High   CAD (coronary artery disease) status post PTCA and stent to obtuse marginal branch in 2016 08/18/2014    Priority: High   DM (diabetes mellitus) (HCC) 02/04/2006    Priority: High   Hyperlipidemia 02/04/2006    Priority: Medium    Essential hypertension 02/04/2006    Priority: Medium    Nephrolithiasis     Priority: Low   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    Priority: Low   RECTAL FISSURE 06/05/2006    Priority: Low   Allergy 12/02/2020   Multiple thyroid nodules 03/31/2020   OSA on CPAP 08/26/2019   Old MI (myocardial infarction) 01/29/2017   Dyslipidemia 08/19/2014   GERD (gastroesophageal reflux disease) 08/19/2014   Past Surgical History:  Procedure Laterality Date   CARDIAC CATHETERIZATION     stent May 2016   COLONOSCOPY     heart stent     May 2016   HERNIA REPAIR      Family History  Problem Relation Age of Onset   Coronary artery disease Father    Liver disease Father        from heart meds per pt   Diabetes Sister        grandmother   Kidney disease Sister        dialysis from dm   Diabetes Sister    Other Sister        flash pulmonary edema led to death   Colon  cancer Neg Hx    Esophageal cancer Neg Hx    Rectal cancer Neg Hx    Stomach cancer Neg Hx     Medications- reviewed and updated Current Outpatient Medications  Medication Sig Dispense Refill   aspirin 81 MG tablet Take 81 mg by mouth daily.     atorvastatin (LIPITOR) 40 MG tablet Take 1 tablet (40 mg total) by mouth every other day. 15 tablet 3   carvedilol (COREG) 3.125 MG tablet Take 1 tablet (3.125 mg total) by mouth 2 times daily. 60 tablet 3   EQ FIBER SUPPLEMENT PO Take 1 tablet by mouth daily. Unknown strength     fluticasone (FLONASE) 50 MCG/ACT nasal spray Place 1 spray into both nostrils daily as needed for allergies or rhinitis.     insulin aspart (NOVOLOG FLEXPEN) 100 UNIT/ML FlexPen Inject 10-20 Units into the skin 3 (three) times daily with meals. 30 mL 3   JARDIANCE 25 MG TABS tablet Take 1 tablet (25 mg total) by mouth daily. (Patient taking differently: Take 25 mg by mouth daily.) 90 tablet 5   lisinopril (ZESTRIL) 40 MG tablet Take 1 tablet (40 mg total) by mouth daily.  30 tablet 3   metFORMIN (GLUCOPHAGE) 1000 MG tablet Take 1 tablet (1,000 mg total) by mouth 2 (two) times daily with a meal. 180 tablet 3   nitroGLYCERIN (NITROSTAT) 0.4 MG SL tablet PLACE 1 TABLET UNDER TONGUE EVERY 5 MINUTES AS NEEDED FOR CHEST PAIN. CALL 911/DOCTOR IF YOU TAKE 2 TABLETS. max of 3 tablets/day. (Patient taking differently: Place 0.4 mg under the tongue every 5 (five) minutes as needed for chest pain.) 25 tablet 3   Omega-3 Fatty Acids (FISH OIL) 1000 MG CAPS Take 4 capsules by mouth daily.     Semaglutide, 1 MG/DOSE, (OZEMPIC, 1 MG/DOSE,) 4 MG/3ML SOPN Inject 1 mg into the skin once a week. 9 mL 3   TOUJEO MAX SOLOSTAR 300 UNIT/ML Solostar Pen Inject 80 Units into the skin daily. 24 mL 3   vitamin B-12 (CYANOCOBALAMIN) 500 MCG tablet Take 500 mcg by mouth daily.     No current facility-administered medications for this visit.    Allergies-reviewed and updated Allergies  Allergen  Reactions   Invokana [Canagliflozin] Other (See Comments)    Yeast infection    Social History   Social History Narrative   Married. 2 children son and daughter. Granddaughter 12/02/2014 born and grandson 7/17//2018.       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 Oropharynx normal Neck: no thyromegaly CV: RRR no murmurs rubs or gallops Lungs: CTAB no crackles, wheeze, rhonchi Abdomen: soft/nontender/nondistended/normal bowel sounds. No rebound or guarding. Umbilical hernia stable Ext: no edema Skin: warm, dry Neuro: grossly normal, moves all extremities, PERRLA    Diabetic Foot Exam - Simple   Simple Foot Form Diabetic Foot exam was performed with the following findings: Yes 04/04/2021  9:45 AM  Visual Inspection No deformities, no ulcerations, no other skin breakdown bilaterally: Yes Sensation Testing Intact to touch and monofilament testing bilaterally: Yes Pulse Check Posterior Tibialis and Dorsalis pulse intact bilaterally: Yes Comments        Assessment and Plan  65 y.o. male presenting for annual physical.  Health Maintenance counseling: 1. Anticipatory guidance: Patient counseled regarding regular dental exams -q6 months- thinks appetite decrease could be from some dental sensitivity- working with dentist, eye exams -yearly due to diabetes ,  avoiding smoking and second hand smoke, limiting alcohol to 2 beverages per day - doesn't drink, no illicit drugs.   2. Risk factor reduction:  Advised patient of need for regular exercise and diet rich and fruits and vegetables to reduce risk of heart attack and stroke.  Exercise- 2 days a week golfing (rides but tries to walk out  from cart) . Goal 150 mins of exercise-doing some walking in addition to golf .  Diet/weight management-down 5 pounds from last visit!  Cooks at home, reasonably healthy diet- has cut back on bread and that has helped him plus appetite lower/dental issues  Wt Readings from Last 3 Encounters:  04/04/21 237 lb (107.5 kg)  03/31/21 239 lb 12.8 oz (108.8 kg)  12/03/20 242 lb 9.6 oz (110 kg)  3. Immunizations/screenings/ancillary studies DISCUSSED:  -  COVID booster vaccine #4- discussed option of bivalent booster-he had his last booster just before bivalent was released -Flu vaccine (12/21) - thanks patient for getting this - Prevnar-20 vaccine #3- recommended pursuing this with last pneumonia shot over 5 years ago-agrees to this today  Immunization History  Administered Date(s) Administered   Influenza Split 04/17/2011   Influenza Whole 01/02/2007, 01/07/2009, 02/04/2010, 01/06/2012   Influenza, High Dose Seasonal PF 01/08/2021   Influenza, Quadrivalent, Recombinant, Inj, Pf 01/04/2019   Influenza,inj,Quad PF,6+ Mos 03/14/2013, 02/03/2014, 01/06/2015, 11/28/2017, 03/03/2020   Influenza-Unspecified 12/25/2012, 01/28/2016, 01/13/2017   PFIZER Comirnaty(Gray Top)Covid-19 Tri-Sucrose Vaccine 10/04/2020   PFIZER(Purple Top)SARS-COV-2 Vaccination 05/30/2019, 06/30/2019   Pneumococcal Conjugate-13 07/15/2013   Pneumococcal Polysaccharide-23 05/11/2008, 08/19/2014   Tdap 07/15/2013   Zoster Recombinat (Shingrix) 02/24/2019, 08/26/2019  4. Prostate cancer screening-will trend psa with labs- prior low risk trend    Lab Results  Component Value Date   PSA 0.85 03/03/2020   PSA 0.59 02/24/2019   PSA 0.62 02/18/2018   5. Colon cancer screening - colonoscopy 04/28/16 with 10 year repeat planned 6. Skin cancer screening- no recent checks. advised regular sunscreen use- could improve. Denies worrisome, changing, or new skin lesions.  7. Smoking associated screening (lung cancer screening, AAA screen  65-75, UA)- Never smoker 8. STD screening -  declines as only active with wife   Status of chronic or acute concerns   # Social update- Wife Terryon Pineiro- doing well after surgery with Dr. Cliffton Asters and then thyroid cancer surgery. He is still semi retired- 3 days a week.   #hypertension S: compliant with coreg 3.125mg  twice daily, lisinopril 40mg  daily BP Readings from Last 3 Encounters:  04/04/21 124/62  03/31/21 120/70  12/03/20 130/80  A/P:  Controlled. Continue current medications.    #hyperlipidemia S: previously mild poorly controlled on atorvastatin 40mg  every other day with LDL above 70. On 02/24/2019 we increased dose to 40 mg daily and improved last year Lab Results  Component Value Date   CHOL 114 12/03/2020   HDL 29 (L) 12/03/2020   LDLCALC 59 12/03/2020   LDLDIRECT 72.0 09/02/2020   TRIG 152 (H) 12/03/2020   CHOLHDL 3.9 12/03/2020   A/P: At goal on last check-too soon for full lipid repeat-check direct LDL only  # Diabetes with history of diabetic retinopathy S: followed closely by Dr. 02/02/2021. Fructosamine more relevant for him- we do a1c when we see him primarily for metrics.   Patient compliant with basal insulin for basal with 80 units per day-Toujeo currently, lispro sliding scale . now on ozempic as well. Orally on jardiance 25mg  daily, metformin 1000mg  twice daily.   Has history retinopathy- but no recent issues Lab Results  Component Value Date   HGBA1C 7.5 (A) 11/22/2020   HGBA1C 7.1 (H) 09/02/2020   HGBA1C 7.2 (H) 03/03/2020   A/P: Reasonable control last check-due for fructosamine/A1c-we will order with labs today and forward to Dr. now continue current medication  # CAD- followed with Dr. 11/24/2020 cardiology S:compliant with aspirin and statin. Asymptomatic-no chest pain or shortness of breath.  Not having to use nitroglycerin 1 stent 2015 A/P: Well-controlled/asymptomatic -continue current medication  # 14/10/2019 FN 02/12/2019  benign with Dr. Christene Lye.  No repeat at this time- was checked recently by Dr. 2016  Recommended follow up: No follow-ups on file. Future Appointments  Date Time Provider Department Center  06/10/2021  8:00 AM 02/14/2019, MD [facility] None  08/02/2021  9:00 AM Elvera Lennox, MD [id number]  None   Lab/Order associations: fasting   ICD-10-CM   1. Preventative health care  Z00.00     2. Hyperlipidemia, unspecified hyperlipidemia type  E78.5     3. Essential hypertension  I10     4. Type 2 diabetes mellitus with other circulatory complication, with long-term current use of insulin (HCC)  E11.59    Z79.4     5. Coronary artery disease involving native coronary artery of native heart without angina pectoris  I25.10     6. Morbid obesity (HCC)  E66.01     7. Nocturia  R35.1       No orders of the defined types were placed in this encounter.   Return precautions advised.  [name], MD

## 2021-03-31 ENCOUNTER — Other Ambulatory Visit: Payer: Self-pay

## 2021-03-31 ENCOUNTER — Encounter: Payer: Self-pay | Admitting: Internal Medicine

## 2021-03-31 ENCOUNTER — Ambulatory Visit: Payer: HMO | Admitting: Internal Medicine

## 2021-03-31 VITALS — BP 120/70 | HR 91 | Ht 70.0 in | Wt 239.8 lb

## 2021-03-31 DIAGNOSIS — E042 Nontoxic multinodular goiter: Secondary | ICD-10-CM | POA: Diagnosis not present

## 2021-03-31 DIAGNOSIS — Z794 Long term (current) use of insulin: Secondary | ICD-10-CM | POA: Diagnosis not present

## 2021-03-31 DIAGNOSIS — E1159 Type 2 diabetes mellitus with other circulatory complications: Secondary | ICD-10-CM

## 2021-03-31 DIAGNOSIS — E669 Obesity, unspecified: Secondary | ICD-10-CM | POA: Diagnosis not present

## 2021-03-31 DIAGNOSIS — E785 Hyperlipidemia, unspecified: Secondary | ICD-10-CM

## 2021-03-31 NOTE — Progress Notes (Signed)
Patient ID: Zachary Warner, male   DOB: August 31, 1955, 66 y.o.   MRN: [id number]  This visit occurred during the SARS-CoV-2 public health emergency.  Safety protocols were in place, including screening questions prior to the visit, additional usage of staff PPE, and extensive cleaning of exam room while observing appropriate contact time as indicated for disinfecting solutions.   HPI: Zachary Warner is a 66 y.o.-year-old male, presenting for f/u for DM2, dx in ~1995, insulin-dependent since ~2005, uncontrolled, with complications (CAD - s/p AMI, DR). Last visit 4 months ago.  Interim history: His wife has lung CA and thyroid ds. (Not CA).  He is taking care of her -significant stress. No increased urination, blurry vision, nausea, chest pain.  He has heel pain-Plantar fasciitis. He is retired and works only 3 days a week, but is quite busy at work.  DM2:  Reviewed HbA1c levels: Lab Results  Component Value Date   HGBA1C 7.5 (A) 11/22/2020   HGBA1C 7.1 (H) 09/02/2020   HGBA1C 7.2 (H) 03/03/2020  07/22/2020: HbA1c calculated from fructosamine is 6.16%, excellent. 03/31/2020: HbA1c calculated from fructosamine is at goal, at 6.1%. 11/25/2019: HbA1c calculated from fructosamine is 6.2% 01/10/2019: HbA1c calculated from fructosamine 6.35% 04/09/2017: HbA1c calculated from fructosamine is 6.8% 11/28/2017:  Hba1c calculated from fructosamine is better: 6.45% 07/11/2017: HbA1c calculated from the fructosamine is 6.6%. 03/08/2017: HbA1c calculated from the fructosamine is 6.5%. 11/06/2016: HbA1c calculated from the fructosamine is slightly higher, at 6.6% 07/05/2016: HbA1c calculated from the fructosamine is 6.2% 04/05/2016: HbA1c calculated from fructosamine is excellent, at 6% 01/04/2016: HbA1c calculated from fructosamine is  6.55%. 11/10/2015: HbA1c calculated from fructosamine is 5.8%. 08/10/2015: HbA1c calculated from fructosamine is 7.05%. 04/09/2015: HbA1c calculated fructosamine (326): 7.1%,  which is much more concordant with his sugar log.   He is on: - Metformin 1000 mg 2x a day. - Jardiance 25 mg daily before breakfast -he initially had yeast infections, now resolved - Ozempic 0.5 mg weekly-added 07/2019 >> 1 mg weekly (started 10/2020) - Tresiba U200  >> Toujeo 80 units daily - Humalog: 25-30-40 units before meals We stopped Januvia 2/2 large doses of mealtime insulin.  He checks his sugars 0 to once a day: - am: 102-148, 158 >> 81-139 >> 79-128 >> 93-135, 155, 166 - 2h after b'fast: 189-251 >> 244 x1 >> n/c - lunch:   104-195 >> 126-160 >> n/c >> 130-177 >> 138, 147 - 2h after lunch:  137-190 >> 158, 160 >> 77-113, 197 - dinner: 160 >> 80, 141-158 >> 149-164 >> 147 - 2h after dinner: 148-189, 300 >> 200 >> 160, 163 >> n/c - bedtime: 133-190 >> n/c >> 168-189 >> n/c >> 169, 194 - nighttime:  n/c >> 200 >> n/c >> 81 >> 90 >> 73 Low sugar: 50 - at night ... >> 80 (golf)  >> 79 >> 73 (night); has hypoglycemia awareness in the 60s. Highest sugar was 368 ... >> 190 >> 164 >> 228.  He has a Reli[employer] meter.  Pt's meals are: - Breakfast: grits or sandwich - Lunch: sandwich - Dinner: chicken or beef or pork + vegetables - Snacks: 1 or 2 snacks  - crackers, peanuts or cookies  No CKD, last BUN/creatinine:  Lab Results  Component Value Date   BUN 20 09/02/2020   CREATININE 1.01 09/02/2020  On lisinopril.  + HL; last set of lipids: Lab Results  Component Value Date   CHOL 114 12/03/2020   HDL 29 (L) 12/03/2020   LDLCALC  59 12/03/2020   LDLDIRECT 72.0 09/02/2020   TRIG 152 (H) 12/03/2020   CHOLHDL 3.9 12/03/2020  On Lipitor 40 mg every day and fish oil 2 capsules twice a day  -Latest eye exam: 08/2020: No DR, prev.+ DR. He was seeing Dr Ambrose Mantle [facility] - retired.  Now Dr. Drucilla Schmidt.  - no numbness and tingling in his feet.  He also has a history of HTN, h/o nephrolithiasis x 3, last in 03/2013, OSA.   He had an MI 07/2014 >> had a stent placed.  Cardiologist: Dr Sedonia Small. He is on supplementation with B12.  He also has a history of thyroid nodules:  Thyroid U/S (01/22/2019): Several nodules, of which 2 were dominant:  Narrative & Impression    Parenchymal Echotexture: Moderately heterogenous Isthmus: 1.0 cm Right lobe: 5.4 x 2.0 x 1.8 cm Left lobe: 4.7 x 2.4 x 2.0 cm _____________________________________   Estimated total number of nodules >/= 1 cm: 5 _____________________________________   Nodule # 1: Location: Isthmus; Mid Maximum size: 1.9 cm; Other 2 dimensions: 1.3 x 1.7 cm Composition: cannot determine (2) Echogenicity: hypoechoic (2) Echogenic foci: macrocalcifications (1) ACR TI-RADS total points: 5.  **Given size (>/= 1.5 cm) and appearance, fine needle aspiration of this moderately suspicious nodule should be considered based on TI-RADS criteria. _________________________________________________________   Nodule # 2: Location: Right; Superior Maximum size: 1.0 cm; Other 2 dimensions: 0.6 x 0.7 cm Composition: solid/almost completely solid (2) Echogenicity: hypoechoic (2) Echogenic foci: punctate echogenic foci (3) ACR TI-RADS total points: 7.  **Given size (>/= 1.0 cm) and appearance, fine needle aspiration of this highly suspicious nodule should be considered based on TI-RADS criteria. _________________________________________________________   Nodule #3 is a predominantly cystic nodule the mid right thyroid lobe that measures 1.1 x 0.8 x 0.8 cm.   Nodule #4 is a cystic nodule in the superior left thyroid lobe that measures 1.5 x 0.9 x 1.0 cm.   Nodule #5 is a mildly complex cystic nodule with septations in the mid left thyroid lobe that measures 1.2 x 1.0 x 1.3 cm.   Nodule # 6: Location: Left; Mid Maximum size: 0.8 cm; Other 2 dimensions: 0.6 x 0.5 cm Composition: mixed cystic and solid (1) Echogenicity: isoechoic (1) Shape: taller-than-wide (3) ACR TI-RADS total points: 5. Given size  (<0.9 cm) and appearance, this nodule does NOT meet TI-RADS criteria for biopsy or dedicated follow-up. _______________________________________   IMPRESSION: 1. Multinodular goiter. 2. Nodule #1 in the isthmus and nodule #2 in the superior right thyroid lobe both meet criteria for ultrasound-guided biopsy. 3. Multiple cystic nodules that do not meet criteria for biopsy.   The above is in keeping with the ACR TI-RADS recommendations - J Am Coll Radiol 2017;14:587-595.   Electronically Signed   By: Richarda Overlie M.D.   On: 01/22/2019 17:11    Biopsies of the dominant nodules (02/13/2019): Benign:  Clinical History: Nodule 1 Isthmus Mid 1.9 cm; Other 2 dimensions: 1.3 x  1.7 cm, Hypoechoic, ACR TI-RADS total points: 5, Moderately suspicious  nodule  Specimen Submitted:  A. THYROID ISTHMUS, FINE NEEDLE ASPIRATION:   FINAL MICROSCOPIC DIAGNOSIS:  - Consistent with benign follicular nodule (Bethesda category II)   SPECIMEN ADEQUACY:  Satisfactory for evaluation   CYTOLOGY - NON PAP  CASE: [id number]  PATIENT: Sy Marcy  Non-Gynecological Cytology Report   Clinical History: Nodule 2 Right Superior 1.0 cm; Other 2 dimensions:  0.6 x 0.7 cm, Solid almost completely solid, Hypoechoic, ACR TI-RADS  total points:  7, Highly suspicious  nodule  Specimen Submitted:  A. THYROID, RT LOBE RUP, FINE NEEDLE ASPIRATION:   FINAL MICROSCOPIC DIAGNOSIS:  - Consistent with benign follicular nodule (Bethesda category II)   SPECIMEN ADEQUACY:  Satisfactory for evaluation  Thyroid ultrasound (04/20/2020):  Parenchymal Echotexture: Moderately heterogeneous Isthmus: 0.8 cm Right lobe: 4.4 x 2.0 x 1.6 cm Left lobe: 4.8 x 2.0 x 1.8 cm _________________________________________________________  Nodule 1: 1.1 x 1.1 x 0.7 cm nodule located in the isthmus has decreased in size since prior examination where it measured 1.9 x 1.7 x 1.3 cm. FNA of this nodule was performed on 02/12/2019.  Please correlate with results.  Nodule 2: 1.0 x 0.9 x 0.6 cm nodule located in the superior right thyroid lobe is not significantly changed in size measuring 1.0 x 0.7 x 0.6 cm on the prior exam. FNA of this nodule was performed on 02/12/2019. Please correlate with results.  Nodule 3: 1.2 x 0.9 x 0.7 cm cystic nodule located in the mid right thyroid lobe is not significantly changed in size since prior exam where it measured 1.1 x 0.8 x 0.8 cm. It does not meet criteria for FNA or imaging follow-up.  Nodule 4: 0.6 x 0.6 x 0.3 cm solid hypoechoic nodule in the inferior right thyroid lobe is new since the prior examination. It does not meet criteria for FNA or imaging follow-up.  Nodule 5: 1.6 x 0.9 x 0.8 cm cystic nodule in the superior left thyroid lobe is not significantly changed in size since prior examination when where measured 1.5 x 1.0 x 0.9 cm. This does not meet criteria for FNA or imaging follow-up.  Nodule 6: 0.8 x 0.7 x 0.6 cm solid isoechoic nodule in the mid left thyroid lobe previously measured 1.2 x 1.0 x 1.3 cm. Interval decrease in size favors a benign etiology. This nodule does not meet criteria for imaging follow-up or FNA. _________________________________________________________  IMPRESSION: Bilateral thyroid nodules. Nodules 1 and 2 were previously biopsied. Please correlate with results. Remaining nodules do not meet criteria for FNA or imaging follow-up.  Pt denies: - feeling nodules in neck - hoarseness - dysphagia - choking - SOB with lying down  Latest TSH was normal: Lab Results  Component Value Date   TSH 1.14 03/31/2020   ROS: + See HPI  I reviewed pt's medications, allergies, PMH, social hx, family hx, and changes were documented in the history of present illness. Otherwise, unchanged from my initial visit note.  Past Medical History:  Diagnosis Date   Allergy    Diabetes mellitus    Hyperlipidemia    Hypertension    Myocardial  infarction [facility])    May 2016   Nephrolithiasis    3x 2007-2015, passed on his own   Sleep apnea    wears CPAP   Past Surgical History:  Procedure Laterality Date   CARDIAC CATHETERIZATION     stent May 2016   COLONOSCOPY     heart stent     May 2016   HERNIA REPAIR     Social History   Socioeconomic History   Marital status: Married    Spouse name: Not on file   Number of children: Not on file   Years of education: Not on file   Highest education level: Not on file  Occupational History   Not on file  Tobacco Use   Smoking status: Never   Smokeless tobacco: Never  Substance and Sexual Activity   Alcohol use: No  Drug use: No   Sexual activity: Never  Other Topics Concern   Not on file  Social History Narrative   Married. 2 children son and daughter. Granddaughter 12/02/2014 born and grandson 7/17//2018.       Works Administrator, sports: golfing (low to mid 40s)   Social Determinants of Corporate investment banker Strain: Not on file  Food Insecurity: Not on file  Transportation Needs: Not on file  Physical Activity: Not on file  Stress: Not on file  Social Connections: Not on file  Intimate Partner Violence: Not on file   Current Outpatient Medications on File Prior to Visit  Medication Sig Dispense Refill   aspirin 81 MG tablet Take 81 mg by mouth daily.     atorvastatin (LIPITOR) 40 MG tablet Take 1 tablet (40 mg total) by mouth every other day. 15 tablet 3   carvedilol (COREG) 3.125 MG tablet Take 1 tablet (3.125 mg total) by mouth 2 times daily. 60 tablet 3   dapagliflozin propanediol (FARXIGA) 10 MG TABS tablet Take 1 tablet (10 mg total) by mouth daily before breakfast. 90 tablet 3   EQ FIBER SUPPLEMENT PO Take 1 tablet by mouth daily. Unknown strength     fluconazole (DIFLUCAN) 150 MG tablet Take 1 tablet (150 mg total) by mouth once. Refill in 3 days if not resolved. (Patient taking differently: Take 150 mg by mouth once.  Take 1 tablet (150 mg total) by mouth once. Refill in 3 days if not resolved.) 1 tablet 0   fluticasone (FLONASE) 50 MCG/ACT nasal spray Place 1 spray into both nostrils daily as needed for allergies or rhinitis.     insulin aspart (NOVOLOG FLEXPEN) 100 UNIT/ML FlexPen Inject 10-20 Units into the skin 3 (three) times daily with meals. 30 mL 3   insulin lispro (HUMALOG) 100 UNIT/ML KwikPen INJECT 28-48 UNITS SUBCUTANEOUSLY THREE TIMES DAILY 30 mL 5   JARDIANCE 25 MG TABS tablet Take 1 tablet (25 mg total) by mouth daily. (Patient taking differently: Take 25 mg by mouth daily.) 90 tablet 5   lisinopril (ZESTRIL) 40 MG tablet Take 1 tablet (40 mg total) by mouth daily. 30 tablet 3   metFORMIN (GLUCOPHAGE) 1000 MG tablet Take 1 tablet (1,000 mg total) by mouth 2 (two) times daily with a meal. 180 tablet 3   nitroGLYCERIN (NITROSTAT) 0.4 MG SL tablet PLACE 1 TABLET UNDER TONGUE EVERY 5 MINUTES AS NEEDED FOR CHEST PAIN. CALL 911/DOCTOR IF YOU TAKE 2 TABLETS. max of 3 tablets/day. (Patient taking differently: Place 0.4 mg under the tongue every 5 (five) minutes as needed for chest pain.) 25 tablet 3   Omega-3 Fatty Acids (FISH OIL) 1000 MG CAPS Take 4 capsules by mouth daily.     Semaglutide, 1 MG/DOSE, (OZEMPIC, 1 MG/DOSE,) 4 MG/3ML SOPN Inject 1 mg into the skin once a week. 9 mL 3   TOUJEO MAX SOLOSTAR 300 UNIT/ML Solostar Pen Inject 80 Units into the skin daily. 24 mL 3   vitamin B-12 (CYANOCOBALAMIN) 500 MCG tablet Take 500 mcg by mouth daily.     No current facility-administered medications on file prior to visit.   Allergies  Allergen Reactions   Invokana [Canagliflozin] Other (See Comments)    Yeast infection   Family History  Problem Relation Age of Onset   Coronary artery disease Father    Liver disease Father        from heart meds per pt  Diabetes Sister        grandmother   Kidney disease Sister        dialysis from dm   Diabetes Sister    Other Sister        flash pulmonary  edema led to death   Colon cancer Neg Hx    Esophageal cancer Neg Hx    Rectal cancer Neg Hx    Stomach cancer Neg Hx     PE: BP 120/70 (BP Location: Right Arm, Patient Position: Sitting, Cuff Size: Normal)    Pulse 91    Ht 5\' 10"  (1.778 m)    Wt 239 lb 12.8 oz (108.8 kg)    SpO2 97%    BMI 34.41 kg/m   Wt Readings from Last 3 Encounters:  03/31/21 239 lb 12.8 oz (108.8 kg)  12/03/20 242 lb 9.6 oz (110 kg)  11/22/20 242 lb 3.2 oz (109.9 kg)   Constitutional: overweight, in NAD Eyes: PERRLA, EOMI, no exophthalmos ENT: moist mucous membranes, no thyromegaly but left thyroid nodule felt on palpation, no cervical lymphadenopathy Cardiovascular: RRR, No MRG Respiratory: CTA B Musculoskeletal: no deformities, strength intact in all 4 Skin: moist, warm, no rashes Neurological: no tremor with outstretched hands, DTR normal in all 4  ASSESSMENT: 1. DM2, insulin-dependent, controlled, with complications - CAD, s/p AMi - DR  He does not have a history of pancreatitis or family history of medullary thyroid cancer.    2. HL  3.  Obesity class I  4.  Thyroid nodules  PLAN:  1. Patient with longstanding, fairly well-controlled type 2 diabetes, on a complex medication regimen with metformin, SGLT2 inhibitor, weekly GLP-1 receptor agonist and basal-bolus insulin regimen.  At last visit, he was off Ozempic for approximately 5 weeks due to the formulation being on backorder.  We called the pharmacy during the visit and this was back in stock so I advised him to restart this.  Also, he ran out of Humalog approximately 1 week prior to the appointment and was substituting this with his Hospital doctorBasaglar.  We discussed that this is not good practice due to the different actions of the 2 insulins.  Sugars were, in fact, mostly at goal with the exception of higher values before lunch and dinner.  Therefore, we restarted Ozempic at 1 mg weekly and discussed about only adding Humalog or NovoLog (whichever was  covered) at the lower dose and only if sugars remain high after starting Ozempic.  HbA1c at last visit was 7.5%, however, HbA1c calculated from fructosamine is usually more accurate for him.  We will check this today. -At today's visit, per review of his meter values, his blood sugars are mostly at target, with only few mild hyperglycemic exceptions during the holidays.  He had to lower blood sugars, in the 70s, but otherwise, no hypoglycemic events.  He mentions that since last visit, he had to add back Humalog and increased the doses.  As of now, I do not feel we need to change his regimen. - I suggested to:  Patient Instructions  Please continue: - Metformin 1000 mg 2x a day. - Jardiance 25 mg daily before breakfast  - Toujeo 80 units daily - Humalog 25-30-40 units before meals - Ozempic 1 mg weekly  Please return in 4 months with your sugar log.  - we we will check a fructosamine level at next visit with PCP which is coming up. - advised to check sugars at different times of the day - 4x a  day, rotating check times - advised for yearly eye exams >> he is UTD - return to clinic in 4 months  2. HL -Reviewed latest lipid panel from 11/2020: LDL at goal, HDL low: Lab Results  Component Value Date   CHOL 114 12/03/2020   HDL 29 (L) 12/03/2020   LDLCALC 59 12/03/2020   LDLDIRECT 72.0 09/02/2020   TRIG 152 (H) 12/03/2020   CHOLHDL 3.9 12/03/2020  -He continues on Lipitor 40 mg daily and fish oil 2000 mg twice a day without side effects  3.  Obesity class I -continue SGLT 2 inhibitor and GLP-1 receptor agonist which should also help with weight loss -Weight was stable at last visit -He lost 3 pounds since then  4.  Thyroid nodules -Initially felt on palpation -No neck compression symptoms -Latest TSH was normal: Lab Results  Component Value Date   TSH 1.14 03/31/2020  -Reviewed the latest thyroid ultrasound from 03/2020: He has 2 dominant nodules which were previously biopsied  with benign results.  The rest of the nodules were small and not worrisome.  No follow-up is needed for these. -We discussed about repeating an ultrasound 2 to 3 years after the last to check on the dominant nodules.  Carlus Pavlovristina Camara Rosander, MD PhD [facility]

## 2021-03-31 NOTE — Patient Instructions (Addendum)
Please continue: - Metformin 1000 mg 2x a day. - Jardiance 25 mg daily before breakfast  - Toujeo 80 units daily - Humalog 25-30-40 units before meals - Ozempic 1 mg weekly  Please return in 4 months with your sugar log. 

## 2021-04-04 ENCOUNTER — Encounter: Payer: Self-pay | Admitting: Family Medicine

## 2021-04-04 ENCOUNTER — Ambulatory Visit (INDEPENDENT_AMBULATORY_CARE_PROVIDER_SITE_OTHER): Payer: HMO | Admitting: Family Medicine

## 2021-04-04 ENCOUNTER — Other Ambulatory Visit: Payer: Self-pay

## 2021-04-04 VITALS — BP 124/62 | HR 95 | Temp 97.5°F | Ht 70.0 in | Wt 237.0 lb

## 2021-04-04 DIAGNOSIS — Z23 Encounter for immunization: Secondary | ICD-10-CM

## 2021-04-04 DIAGNOSIS — R351 Nocturia: Secondary | ICD-10-CM

## 2021-04-04 DIAGNOSIS — E785 Hyperlipidemia, unspecified: Secondary | ICD-10-CM

## 2021-04-04 DIAGNOSIS — Z0000 Encounter for general adult medical examination without abnormal findings: Secondary | ICD-10-CM

## 2021-04-04 DIAGNOSIS — I10 Essential (primary) hypertension: Secondary | ICD-10-CM | POA: Diagnosis not present

## 2021-04-04 DIAGNOSIS — I2510 Atherosclerotic heart disease of native coronary artery without angina pectoris: Secondary | ICD-10-CM | POA: Diagnosis not present

## 2021-04-04 DIAGNOSIS — E1159 Type 2 diabetes mellitus with other circulatory complications: Secondary | ICD-10-CM

## 2021-04-04 DIAGNOSIS — Z794 Long term (current) use of insulin: Secondary | ICD-10-CM

## 2021-04-04 LAB — CBC WITH DIFFERENTIAL/PLATELET
Basophils Absolute: 0 10*3/uL (ref 0.0–0.1)
Basophils Relative: 0.6 % (ref 0.0–3.0)
Eosinophils Absolute: 0.2 10*3/uL (ref 0.0–0.7)
Eosinophils Relative: 3.6 % (ref 0.0–5.0)
HCT: 45.4 % (ref 39.0–52.0)
Hemoglobin: 14.9 g/dL (ref 13.0–17.0)
Lymphocytes Relative: 37.8 % (ref 12.0–46.0)
Lymphs Abs: 2.2 10*3/uL (ref 0.7–4.0)
MCHC: 32.7 g/dL (ref 30.0–36.0)
MCV: 87.2 fl (ref 78.0–100.0)
Monocytes Absolute: 0.4 10*3/uL (ref 0.1–1.0)
Monocytes Relative: 7.3 % (ref 3.0–12.0)
Neutro Abs: 2.9 10*3/uL (ref 1.4–7.7)
Neutrophils Relative %: 50.7 % (ref 43.0–77.0)
Platelets: 198 10*3/uL (ref 150.0–400.0)
RBC: 5.2 Mil/uL (ref 4.22–5.81)
RDW: 14.3 % (ref 11.5–15.5)
WBC: 5.8 10*3/uL (ref 4.0–10.5)

## 2021-04-04 LAB — COMPREHENSIVE METABOLIC PANEL
ALT: 18 U/L (ref 0–53)
AST: 15 U/L (ref 0–37)
Albumin: 4.4 g/dL (ref 3.5–5.2)
Alkaline Phosphatase: 53 U/L (ref 39–117)
BUN: 16 mg/dL (ref 6–23)
CO2: 25 mEq/L (ref 19–32)
Calcium: 9.5 mg/dL (ref 8.4–10.5)
Chloride: 106 mEq/L (ref 96–112)
Creatinine, Ser: 0.92 mg/dL (ref 0.40–1.50)
GFR: 87.2 mL/min (ref >60.00)
Glucose, Bld: 158 mg/dL — ABNORMAL HIGH (ref 70–99)
Potassium: 4.5 mEq/L (ref 3.5–5.1)
Sodium: 140 mEq/L (ref 135–145)
Total Bilirubin: 0.8 mg/dL (ref 0.2–1.2)
Total Protein: 7.2 g/dL (ref 6.0–8.3)

## 2021-04-04 LAB — PSA: PSA: 0.61 ng/mL (ref 0.10–4.00)

## 2021-04-04 LAB — LDL CHOLESTEROL, DIRECT: Direct LDL: 82 mg/dL

## 2021-04-04 LAB — HEMOGLOBIN A1C: Hgb A1c MFr Bld: 7.4 % — ABNORMAL HIGH (ref 4.6–6.5)

## 2021-04-04 MED ORDER — ATORVASTATIN CALCIUM 40 MG PO TABS: 40.0000 mg | ORAL_TABLET | Freq: Every day | ORAL | 3 refills | Status: DC

## 2021-04-04 NOTE — Addendum Note (Signed)
Addended by: Lieutenant Diego A on: 04/04/2021 10:17 AM   Modules accepted: Orders

## 2021-04-04 NOTE — Patient Instructions (Addendum)
Prevnar 20 today  Please stop by lab before you go If you have mychart- we will send your results within 3 business days of [place] receiving them.  If you do not have mychart- we will call you about results within 5 business days of [place] receiving them.  *please also note that you will see labs on mychart as soon as they post. I will later go in and write notes on them- will say "notes from Dr. Durene Cal"   Recommended follow up: Return in about 6 months (around 10/02/2021) for follow up- or sooner if needed.

## 2021-04-05 ENCOUNTER — Other Ambulatory Visit: Payer: Self-pay | Admitting: Internal Medicine

## 2021-04-05 DIAGNOSIS — Z794 Long term (current) use of insulin: Secondary | ICD-10-CM

## 2021-04-07 LAB — FRUCTOSAMINE: Fructosamine: 284 umol/L (ref 205–285)

## 2021-06-02 ENCOUNTER — Other Ambulatory Visit: Payer: Self-pay | Admitting: Family Medicine

## 2021-06-10 ENCOUNTER — Encounter: Payer: Self-pay | Admitting: Cardiology

## 2021-06-10 ENCOUNTER — Ambulatory Visit: Payer: HMO | Admitting: Cardiology

## 2021-06-10 ENCOUNTER — Other Ambulatory Visit: Payer: Self-pay

## 2021-06-10 VITALS — BP 110/60 | HR 88 | Ht 70.5 in | Wt 238.8 lb

## 2021-06-10 DIAGNOSIS — I10 Essential (primary) hypertension: Secondary | ICD-10-CM | POA: Diagnosis not present

## 2021-06-10 DIAGNOSIS — I2510 Atherosclerotic heart disease of native coronary artery without angina pectoris: Secondary | ICD-10-CM | POA: Diagnosis not present

## 2021-06-10 DIAGNOSIS — Z9989 Dependence on other enabling machines and devices: Secondary | ICD-10-CM

## 2021-06-10 DIAGNOSIS — G4733 Obstructive sleep apnea (adult) (pediatric): Secondary | ICD-10-CM | POA: Diagnosis not present

## 2021-06-10 DIAGNOSIS — E785 Hyperlipidemia, unspecified: Secondary | ICD-10-CM | POA: Diagnosis not present

## 2021-06-10 NOTE — Patient Instructions (Signed)

## 2021-06-10 NOTE — Progress Notes (Signed)
?Cardiology Office Note:   ? ?Date:  06/10/2021  ? ?ID:  Zachary Warner, DOB [birth year], MRN [id number] ? ?PCP:  Zachary Majestic, MD  ?Cardiologist:  Zachary Balsam, MD   ? ?Referring MD: Zachary Majestic, MD  ? ?Chief Complaint  ?Patient presents with  ? Follow-up  ?Doing well ? ?History of Present Illness:   ? ?Zachary Warner is a 66 y.o. male with past medical history significant for coronary artery disease.  In May 2016 he suffered from myocardial infarction he required PTCA and stenting of the obtuse marginal branch.  He also had mild to moderate disease of LAD at that time.  He also have history of essential hypertension, longstanding diabetes, dyslipidemia.  He comes today to my office for follow-up.  Overall doing very well.  He denies have any chest pain tightness squeezing pressure burning chest.  His work requires a lot of walking and also he walks just for health reasons.  She does not have any symptoms. ? ?Past Medical History:  ?Diagnosis Date  ? Allergy   ? Diabetes mellitus   ? Hyperlipidemia   ? Hypertension   ? Myocardial infarction [facility])   ? May 2016  ? Nephrolithiasis   ? 3x 2007-2015, passed on his own  ? Sleep apnea   ? wears CPAP  ? ? ?Past Surgical History:  ?Procedure Laterality Date  ? CARDIAC CATHETERIZATION    ? stent May 2016  ? COLONOSCOPY    ? heart stent    ? May 2016  ? HERNIA REPAIR    ? ? ?Current Medications: ?Current Meds  ?Medication Sig  ? aspirin 81 MG tablet Take 81 mg by mouth daily.  ? atorvastatin (LIPITOR) 40 MG tablet Take 1 tablet (40 mg total) by mouth daily.  ? carvedilol (COREG) 3.125 MG tablet Take 1 tablet (3.125 mg total) by mouth 2 times daily. (Patient taking differently: Take 3.125 mg by mouth 2 (two) times daily with a meal.)  ? EQ FIBER SUPPLEMENT PO Take 1 tablet by mouth daily. Unknown strength  ? fluticasone (FLONASE) 50 MCG/ACT nasal spray Place 1 spray into both nostrils daily as needed for allergies or rhinitis.  ? insulin aspart (NOVOLOG FLEXPEN)  100 UNIT/ML FlexPen Inject 10-20 Units into the skin 3 (three) times daily with meals.  ? JARDIANCE 25 MG TABS tablet Take 1 tablet (25 mg total) by mouth daily. (Patient taking differently: Take 25 mg by mouth daily.)  ? lisinopril (ZESTRIL) 40 MG tablet Take 1 tablet (40 mg total) by mouth daily.  ? metFORMIN (GLUCOPHAGE) 1000 MG tablet Take 1 tablet (1,000 mg total) by mouth 2 (two) times daily with a meal.  ? nitroGLYCERIN (NITROSTAT) 0.4 MG SL tablet PLACE 1 TABLET UNDER TONGUE EVERY 5 MINUTES AS NEEDED FOR CHEST PAIN. CALL 911/DOCTOR IF YOU TAKE 2 TABLETS. max of 3 tablets/day. (Patient taking differently: Place 0.4 mg under the tongue every 5 (five) minutes as needed for chest pain.)  ? Omega-3 Fatty Acids (FISH OIL) 1000 MG CAPS Take 4 capsules by mouth daily.  ? OZEMPIC, 1 MG/DOSE, 4 MG/3ML SOPN Inject 1 mg into the skin once a week.  ? TOUJEO MAX SOLOSTAR 300 UNIT/ML Solostar Pen Inject 80 Units into the skin daily.  ? vitamin B-12 (CYANOCOBALAMIN) 500 MCG tablet Take 500 mcg by mouth daily.  ?  ? ?Allergies:   Invokana [canagliflozin]  ? ?Social History  ? ?Socioeconomic History  ? Marital status: Married  ?  Spouse  name: Not on file  ? Number of children: Not on file  ? Years of education: Not on file  ? Highest education level: Not on file  ?Occupational History  ? Not on file  ?Tobacco Use  ? Smoking status: Never  ? Smokeless tobacco: Never  ?Substance and Sexual Activity  ? Alcohol use: No  ? Drug use: No  ? Sexual activity: Never  ?Other Topics Concern  ? Not on file  ?Social History Narrative  ? Married. 2 children son and daughter. Granddaughter 12/02/2014 born and grandson 7/17//2018.   ?   ? 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   ? Hobbies: golfing (low to mid 53s)  ? ?Social Determinants of Health  ? ?Financial Resource Strain: Not on file  ?Food Insecurity: Not on file  ?Transportation Needs: Not on file  ?Physical Activity: Not on file  ?Stress:  Not on file  ?Social Connections: Not on file  ?  ? ?Family History: ?The patient's family history includes Coronary artery disease in his father; Diabetes in his sister and sister; Kidney disease in his sister; Liver disease in his father; Other in his sister. There is no history of Colon cancer, Esophageal cancer, Rectal cancer, or Stomach cancer. ?ROS:   ?Please see the history of present illness.    ?All 14 point review of systems negative except as described per history of present illness ? ?EKGs/Labs/Other Studies Reviewed:   ? ? ? ?Recent Labs: ?04/04/2021: ALT 18; BUN 16; Creatinine, Ser 0.92; Hemoglobin 14.9; Platelets 198.0; Potassium 4.5; Sodium 140  ?Recent Lipid Panel ?   ?Component Value Date/Time  ? CHOL 114 12/03/2020 0922  ? TRIG 152 (H) 12/03/2020 8315  ? TRIG 176 (H) 01/29/2006 1110  ? HDL 29 (L) 12/03/2020 1761  ? CHOLHDL 3.9 12/03/2020 0922  ? CHOLHDL 3.1 03/03/2020 0952  ? VLDL 49.0 (H) 02/24/2019 1110  ? LDLCALC 59 12/03/2020 0922  ? LDLCALC 51 03/03/2020 0952  ? LDLDIRECT 82.0 04/04/2021 1015  ? ? ?Physical Exam:   ? ?VS:  BP 110/60 (BP Location: Left Arm, Patient Position: Sitting)   Pulse 88   Ht 5' 10.5" (1.791 m)   Wt 238 lb 12.8 oz (108.3 kg)   SpO2 97%   BMI 33.78 kg/m?    ? ?Wt Readings from Last 3 Encounters:  ?06/10/21 238 lb 12.8 oz (108.3 kg)  ?04/04/21 237 lb (107.5 kg)  ?03/31/21 239 lb 12.8 oz (108.8 kg)  ?  ? ?GEN:  Well nourished, well developed in no acute distress ?HEENT: Normal ?NECK: No JVD; No carotid bruits ?LYMPHATICS: No lymphadenopathy ?CARDIAC: RRR, no murmurs, no rubs, no gallops ?RESPIRATORY:  Clear to auscultation without rales, wheezing or rhonchi  ?ABDOMEN: Soft, non-tender, non-distended ?MUSCULOSKELETAL:  No edema; No deformity  ?SKIN: Warm and dry ?LOWER EXTREMITIES: no swelling ?NEUROLOGIC:  Alert and oriented x 3 ?PSYCHIATRIC:  Normal affect  ? ?ASSESSMENT:   ? ?1. Coronary artery disease involving native coronary artery of native heart without angina  pectoris   ?2. Essential hypertension   ?3. OSA on CPAP   ?4. Dyslipidemia   ? ?PLAN:   ? ?In order of problems listed above: ? ?Coronary disease stable from that point review on antiplatelet therapy which I will continue.  Recently we did echocardiogram to check for left ventricle ejection fraction which was normal.  We will continue antiplatelet therapy beta-blockade as well as ACE inhibitor. ?Essential hypertension blood  pressure well controlled continue present management. ?Dyslipidemia he is on high intensity statin form of Lipitor 40 which I will continue.  I did review K PN which show me his LDL of 59 HDL 29.  We did talk about need to exercise and good diet ?Obstructive sleep apnea on CPAP that being managed by primary care physicians. ?Diabetes mellitus.  I did review his K PN which only his hemoglobin A1c is 7.4.  This is from April 04, 2021.  Need to be better controlled.  He understand this and he is working on it ? ? ?Medication Adjustments/Labs and Tests Ordered: ?Current medicines are reviewed at length with the patient today.  Concerns regarding medicines are outlined above.  ?No orders of the defined types were placed in this encounter. ? ?Medication changes: No orders of the defined types were placed in this encounter. ? ? ?Signed, ?Georgeanna Leaobert J. Iyah Laguna, MD, [facility] Center[facility]ACC ?06/10/2021 8:21 AM    ?[facility] ?

## 2021-06-18 ENCOUNTER — Other Ambulatory Visit: Payer: Self-pay | Admitting: Family Medicine

## 2021-06-29 ENCOUNTER — Other Ambulatory Visit: Payer: Self-pay | Admitting: Internal Medicine

## 2021-06-29 DIAGNOSIS — Z794 Long term (current) use of insulin: Secondary | ICD-10-CM

## 2021-08-02 ENCOUNTER — Ambulatory Visit: Payer: HMO

## 2021-08-02 ENCOUNTER — Encounter: Payer: Self-pay | Admitting: Internal Medicine

## 2021-08-02 ENCOUNTER — Ambulatory Visit: Payer: HMO | Admitting: Internal Medicine

## 2021-08-02 VITALS — BP 110/74 | HR 89 | Ht 70.5 in | Wt 237.4 lb

## 2021-08-02 DIAGNOSIS — E1159 Type 2 diabetes mellitus with other circulatory complications: Secondary | ICD-10-CM | POA: Diagnosis not present

## 2021-08-02 DIAGNOSIS — E042 Nontoxic multinodular goiter: Secondary | ICD-10-CM | POA: Diagnosis not present

## 2021-08-02 DIAGNOSIS — Z794 Long term (current) use of insulin: Secondary | ICD-10-CM | POA: Diagnosis not present

## 2021-08-02 DIAGNOSIS — E785 Hyperlipidemia, unspecified: Secondary | ICD-10-CM | POA: Diagnosis not present

## 2021-08-02 DIAGNOSIS — E669 Obesity, unspecified: Secondary | ICD-10-CM | POA: Diagnosis not present

## 2021-08-02 LAB — POCT GLYCOSYLATED HEMOGLOBIN (HGB A1C): Hemoglobin A1C: 7 % — AB (ref 4.0–5.6)

## 2021-08-02 NOTE — Patient Instructions (Signed)
Please continue: - Metformin 1000 mg 2x a day. - Jardiance 25 mg daily before breakfast  - Toujeo 80 units daily - Humalog 25-30-40 units before meals - Ozempic 1 mg weekly  Please return in 4 months with your sugar log. 

## 2021-08-02 NOTE — Progress Notes (Signed)
Patient ID: Zachary Warner Weitzman, male   DOB: 07/16/1955, 66 y.o.   MRN: [id number] ? ?This visit occurred during the SARS-CoV-2 public health emergency.  Safety protocols were in place, including screening questions prior to the visit, additional usage of staff PPE, and extensive cleaning of exam room while observing appropriate contact time as indicated for disinfecting solutions.  ? ?HPI: ?Zachary Warner Hyun is a 66 y.o.-year-old male, presenting for f/u for DM2, dx in ~1995, insulin-dependent since ~2005, uncontrolled, with complications (CAD - s/p AMI, DR). Last visit 4 months ago. ? ?Interim history: ?No increased urination, blurry vision, nausea, chest pain.   ?His heel pain improved after starting to use special socks and using a TENS unit-Plantar fasciitis. ?He is retired and works only 3 days a week. ?[place] ? ?DM2: ? ?Reviewed HbA1c levels: ?Lab Results  ?Component Value Date  ? HGBA1C 7.4 (H) 04/04/2021  ? HGBA1C 7.5 (A) 11/22/2020  ? HGBA1C 7.1 (H) 09/02/2020  ?07/22/2020: HbA1c calculated from fructosamine is 6.16%, excellent. ?03/31/2020: HbA1c calculated from fructosamine is at goal, at 6.1%. ?11/25/2019: HbA1c calculated from fructosamine is 6.2% ?01/10/2019: HbA1c calculated from fructosamine 6.35% ?04/09/2017: HbA1c calculated from fructosamine is 6.8% ?11/28/2017:  Hba1c calculated from fructosamine is better: 6.45% ?07/11/2017: HbA1c calculated from the fructosamine is 6.6%. ?03/08/2017: HbA1c calculated from the fructosamine is 6.5%. ?11/06/2016: HbA1c calculated from the fructosamine is slightly higher, at 6.6% ?07/05/2016: HbA1c calculated from the fructosamine is 6.2% ?04/05/2016: HbA1c calculated from fructosamine is excellent, at 6% ?01/04/2016: HbA1c calculated from fructosamine is  6.55%. ?11/10/2015: HbA1c calculated from fructosamine is 5.8%. ?08/10/2015: HbA1c calculated from fructosamine is 7.05%. ?04/09/2015: HbA1c calculated fructosamine (326): 7.1%,  which is much more concordant with his sugar log.  ? ?He is on: ?- Metformin 1000 mg 2x a day. ?- Jardiance 25 mg daily before breakfast -he initially had yeast infections, now resolved ?- Ozempic 0.5 mg weekly-added 07/2019 >> 1 mg weekly (started 10/2020) ?- Evaristo Buryresiba U200  >> Toujeo 80 units daily ?- Humalog: 25-30-40 units before meals ?We stopped Januvia 2/2 large doses of mealtime insulin. ? ?He checks his sugars 0 to once a day: ?- am: 81-139 >> 79-128 >> 93-135, 155, 166 >> 89-149, 162 ?- 2h after b'fast: 189-251 >> 244 x1 >> n/c >> 148 ?- lunch:  126-160 >> n/c >> 130-177 >> 138, 147 >> n/c ?- 2h after lunch:  137-190 >> 158, 160 >> 77-113, 197 >> n/c ?- dinner: 160 >> 80, 141-158 >> 149-164 >> 147 >> 125 ?- 2h after dinner: 148-189, 300 >> 200 >> 160, 163 >> n/c ?- bedtime: 168-189 >> n/c >> 169, 194 >> see below ?- nighttime:  n/c >> 200 >> n/c >> 81 >> 90 >> 73 >> 73, 109 ?Low sugar: 50 - at night ... >> 80 (golf)  >> 79 >> 73 (night) >> 89; has hypoglycemia awareness in the 60s. ?Highest sugar was 368 ... >> 190 >> 164 >> 228 >> 162. ? ?He has a ReliOn meter. ? ?Pt's meals are: ?- Breakfast: grits or sandwich ?- Lunch: sandwich ?- Dinner: chicken or beef or pork + vegetables ?- Snacks: 1 or 2 snacks  - crackers, peanuts or cookies ? ?No CKD, last BUN/creatinine:  ?Lab Results  ?Component Value Date  ? BUN 16 04/04/2021  ? CREATININE 0.92 04/04/2021  ?On lisinopril. ? ?+ HL; last set of lipids: ?Lab Results  ?Component Value Date  ? CHOL 114 12/03/2020  ?  HDL 29 (L) 12/03/2020  ? LDLCALC 59 12/03/2020  ? LDLDIRECT 82.0 04/04/2021  ? TRIG 152 (H) 12/03/2020  ? CHOLHDL 3.9 12/03/2020  ?On Lipitor 40 mg every day and fish oil 2 capsules twice a day ? ?-Latest eye exam: 08/2020: No DR, prev.+ DR. He was seeing Dr Ambrose Mantle [facility] - retired.  Now Dr. Drucilla Schmidt. ? ?- no numbness and tingling in his feet. Last foot exam: 03/2021. ? ?He also has a history of HTN, h/o nephrolithiasis x 3, last in 03/2013,  OSA.   ?He had an MI 07/2014 >> had a stent placed. Cardiologist: Dr Sedonia Small. ?He is on supplementation with B12. ? ?Thyroid nodules: ? ?Thyroid U/S (01/22/2019): Several nodules, of which 2 were dominant: ? ?Narrative & Impression    ?Parenchymal Echotexture: Moderately heterogenous ?Isthmus: 1.0 cm ?Right lobe: 5.4 x 2.0 x 1.8 cm ?Left lobe: 4.7 x 2.4 x 2.0 cm ?_____________________________________ ?  ?Estimated total number of nodules >/= 1 cm: 5 ?_____________________________________ ?  ?Nodule # 1: ?Location: Isthmus; Mid ?Maximum size: 1.9 cm; Other 2 dimensions: 1.3 x 1.7 cm ?Composition: cannot determine (2) ?Echogenicity: hypoechoic (2) ?Echogenic foci: macrocalcifications (1) ?ACR TI-RADS total points: 5. ? **Given size (>/= 1.5 cm) and appearance, fine needle aspiration of ?this moderately suspicious nodule should be considered based on ?TI-RADS criteria. ?_________________________________________________________ ?  ?Nodule # 2: ?Location: Right; Superior ?Maximum size: 1.0 cm; Other 2 dimensions: 0.6 x 0.7 cm ?Composition: solid/almost completely solid (2) ?Echogenicity: hypoechoic (2) ?Echogenic foci: punctate echogenic foci (3) ?ACR TI-RADS total points: 7. ? **Given size (>/= 1.0 cm) and appearance, fine needle aspiration of ?this highly suspicious nodule should be considered based on TI-RADS ?criteria. ?_________________________________________________________ ?  ?Nodule #3 is a predominantly cystic nodule the mid right thyroid ?lobe that measures 1.1 x 0.8 x 0.8 cm. ?  ?Nodule #4 is a cystic nodule in the superior left thyroid lobe that ?measures 1.5 x 0.9 x 1.0 cm. ?  ?Nodule #5 is a mildly complex cystic nodule with septations in the ?mid left thyroid lobe that measures 1.2 x 1.0 x 1.3 cm. ?  ?Nodule # 6: ?Location: Left; Mid ?Maximum size: 0.8 cm; Other 2 dimensions: 0.6 x 0.5 cm ?Composition: mixed cystic and solid (1) ?Echogenicity: isoechoic (1) ?Shape: taller-than-wide (3) ?ACR TI-RADS total  points: 5. ?Given size (<0.9 cm) and appearance, this nodule does NOT meet ?TI-RADS criteria for biopsy or dedicated follow-up. ?_______________________________________ ?  ?IMPRESSION: ?1. Multinodular goiter. ?2. Nodule #1 in the isthmus and nodule #2 in the superior right ?thyroid lobe both meet criteria for ultrasound-guided biopsy. ?3. Multiple cystic nodules that do not meet criteria for biopsy. ?  ?The above is in keeping with the ACR TI-RADS recommendations - J Am ?Coll Radiol 2017;14:587-595. ?  ?Electronically Signed ?  By: Richarda Overlie M.D. ?  On: 01/22/2019 17:11  ? ? ?Biopsies of the dominant nodules (02/13/2019): Benign: ? ?Clinical History: Nodule 1 Isthmus Mid 1.9 cm; Other 2 dimensions: 1.3 x  ?1.7 cm, Hypoechoic, ACR TI-RADS total points: 5, Moderately suspicious  ?nodule  ?Specimen Submitted:  A. THYROID ISTHMUS, FINE NEEDLE ASPIRATION:  ? ?FINAL MICROSCOPIC DIAGNOSIS:  ?- Consistent with benign follicular nodule (Bethesda category II)  ? ?SPECIMEN ADEQUACY:  ?Satisfactory for evaluation  ? ?CYTOLOGY - NON PAP  ?CASE: [id number]  ?PATIENT: Azavier Lo  ?Non-Gynecological Cytology Report  ? ?Clinical History: Nodule 2 Right Superior 1.0 cm; Other 2 dimensions:  ?0.6 x 0.7 cm, Solid almost completely solid, Hypoechoic,  ACR TI-RADS  ?total points: 7, Highly suspicious  nodule  ?Specimen Submitted:  A. THYROID, RT LOBE RUP, FINE NEEDLE ASPIRATION:  ? ?FINAL MICROSCOPIC DIAGNOSIS:  ?- Consistent with benign follicular nodule (Bethesda category II)  ? ?SPECIMEN ADEQUACY:  ?Satisfactory for evaluation ? ?Thyroid ultrasound (04/20/2020):  ?Parenchymal Echotexture: Moderately heterogeneous ?Isthmus: 0.8 cm ?Right lobe: 4.4 x 2.0 x 1.6 cm ?Left lobe: 4.8 x 2.0 x 1.8 cm ?_________________________________________________________ ? ?Nodule 1: 1.1 x 1.1 x 0.7 cm nodule located in the isthmus has ?decreased in size since prior examination where it measured 1.9 x ?1.7 x 1.3 cm. FNA of this nodule was performed on  02/12/2019. Please ?correlate with results. ? ?Nodule 2: 1.0 x 0.9 x 0.6 cm nodule located in the superior right ?thyroid lobe is not significantly changed in size measuring 1.0 x ?0.7 x 0.6 cm on the

## 2021-08-08 ENCOUNTER — Ambulatory Visit (INDEPENDENT_AMBULATORY_CARE_PROVIDER_SITE_OTHER): Payer: HMO

## 2021-08-08 DIAGNOSIS — Z0000 Encounter for general adult medical examination without abnormal findings: Secondary | ICD-10-CM | POA: Diagnosis not present

## 2021-08-08 NOTE — Progress Notes (Signed)
Virtual Visit via Telephone Note ? ?I connected with  Zachary Warner on 08/08/21 at 11:30 AM EDT by telephone and verified that I am speaking with the correct person using two identifiers. ? ?Medicare Annual Wellness visit completed telephonically due to Covid-19 pandemic.  ? ?Persons participating in this call: This Health Coach and this patient.  ? ?Location: ?Patient: Home ?Provider: Office  ?  ?I discussed the limitations, risks, security and privacy concerns of performing an evaluation and management service by telephone and the availability of in person appointments. The patient expressed understanding and agreed to proceed. ? ?Unable to perform video visit due to video visit attempted and failed and/or patient does not have video capability.  ? ?Some vital signs may be absent or patient reported.  ? ?Marzella Schleinina H Melva Faux, LPN ? ? ?Subjective:  ? Zachary Warner is a 66 y.o. male who presents for an Initial Medicare Annual Wellness Visit. ? ?Review of Systems    ? ?Cardiac Risk Factors include: advanced age (>255men, 53>65 women);diabetes mellitus;hypertension;dyslipidemia;male gender;obesity (BMI >30kg/m2) ? ?   ?Objective:  ?  ?There were no vitals filed for this visit. ?There is no height or weight on file to calculate BMI. ? ? ?  08/08/2021  ? 11:37 AM 04/28/2016  ? 10:22 AM 04/17/2016  ?  2:04 PM  ?Advanced Directives  ?Does Patient Have a Medical Advance Directive? No No No  ?Would patient like information on creating a medical advance directive? No - Patient declined    ? ? ?Current Medications (verified) ?Outpatient Encounter Medications as of 08/08/2021  ?Medication Sig  ? aspirin 81 MG tablet Take 81 mg by mouth daily.  ? atorvastatin (LIPITOR) 40 MG tablet Take 1 tablet (40 mg total) by mouth daily.  ? carvedilol (COREG) 3.125 MG tablet Take 1 tablet (3.125 mg total) by mouth 2 times daily.  ? EQ FIBER SUPPLEMENT PO Take 1 tablet by mouth daily. Unknown strength  ? fluticasone (FLONASE) 50 MCG/ACT nasal spray  Place 1 spray into both nostrils daily as needed for allergies or rhinitis.  ? insulin aspart (NOVOLOG FLEXPEN) 100 UNIT/ML FlexPen Inject 10-20 Units into the skin 3 (three) times daily with meals.  ? JARDIANCE 25 MG TABS tablet Take 1 tablet (25 mg total) by mouth daily. (Patient taking differently: Take 25 mg by mouth daily.)  ? lisinopril (ZESTRIL) 40 MG tablet Take 1 tablet (40 mg total) by mouth daily.  ? metFORMIN (GLUCOPHAGE) 1000 MG tablet Take 1 tablet (1,000 mg total) by mouth 2 (two) times daily with a meal.  ? nitroGLYCERIN (NITROSTAT) 0.4 MG SL tablet PLACE 1 TABLET UNDER TONGUE EVERY 5 MINUTES AS NEEDED FOR CHEST PAIN. CALL 911/DOCTOR IF YOU TAKE 2 TABLETS. max of 3 tablets/day. (Patient taking differently: Place 0.4 mg under the tongue every 5 (five) minutes as needed for chest pain.)  ? Omega-3 Fatty Acids (FISH OIL) 1000 MG CAPS Take 4 capsules by mouth daily.  ? OZEMPIC, 1 MG/DOSE, 4 MG/3ML SOPN Inject 1 mg into the skin once a week.  ? TOUJEO MAX SOLOSTAR 300 UNIT/ML Solostar Pen Inject 80 Units into the skin daily.  ? vitamin B-12 (CYANOCOBALAMIN) 500 MCG tablet Take 500 mcg by mouth daily.  ? ?No facility-administered encounter medications on file as of 08/08/2021.  ? ? ?Allergies (verified) ?Invokana [canagliflozin]  ? ?History: ?Past Medical History:  ?Diagnosis Date  ? Allergy   ? Diabetes mellitus   ? Hyperlipidemia   ? Hypertension   ? Myocardial  infarction [facility])   ? May 2016  ? Nephrolithiasis   ? 3x 2007-2015, passed on his own  ? Sleep apnea   ? wears CPAP  ? ?Past Surgical History:  ?Procedure Laterality Date  ? CARDIAC CATHETERIZATION    ? stent May 2016  ? COLONOSCOPY    ? heart stent    ? May 2016  ? HERNIA REPAIR    ? ?Family History  ?Problem Relation Age of Onset  ? Coronary artery disease Father   ? Liver disease Father   ?     from heart meds per pt  ? Diabetes Sister   ?     grandmother  ? Kidney disease Sister   ?     dialysis from dm  ? Diabetes Sister   ? Other Sister   ?      flash pulmonary edema led to death  ? Colon cancer Neg Hx   ? Esophageal cancer Neg Hx   ? Rectal cancer Neg Hx   ? Stomach cancer Neg Hx   ? ?Social History  ? ?Socioeconomic History  ? Marital status: Married  ?  Spouse name: Not on file  ? Number of children: Not on file  ? Years of education: Not on file  ? Highest education level: Not on file  ?Occupational History  ? Not on file  ?Tobacco Use  ? Smoking status: Never  ? Smokeless tobacco: Never  ?Substance and Sexual Activity  ? Alcohol use: No  ? Drug use: No  ? Sexual activity: Never  ?Other Topics Concern  ? Not on file  ?Social History Narrative  ? Married. 2 children son and daughter. Granddaughter 12/02/2014 born and grandson 7/17//2018.   ?   ? 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   ? Hobbies: golfing (low to mid 59s)  ? ?Social Determinants of Health  ? ?Financial Resource Strain: Low Risk   ? Difficulty of Paying Living Expenses: Not hard at all  ?Food Insecurity: No Food Insecurity  ? Worried About Programme researcher, broadcasting/film/video in the Last Year: Never true  ? Ran Out of Food in the Last Year: Never true  ?Transportation Needs: No Transportation Needs  ? Lack of Transportation (Medical): No  ? Lack of Transportation (Non-Medical): No  ?Physical Activity: Sufficiently Active  ? Days of Exercise per Week: 3 days  ? Minutes of Exercise per Session: 120 min  ?Stress: No Stress Concern Present  ? Feeling of Stress : Not at all  ?Social Connections: Moderately Integrated  ? Frequency of Communication with Friends and Family: More than three times a week  ? Frequency of Social Gatherings with Friends and Family: More than three times a week  ? Attends Religious Services: More than 4 times per year  ? Active Member of Clubs or Organizations: No  ? Attends Banker Meetings: Never  ? Marital Status: Married  ? ? ?Tobacco Counseling ?Counseling given: Not Answered ? ? ?Clinical Intake: ? ?Pre-visit preparation  completed: Yes ? ?Pain : No/denies pain ? ?  ? ?BMI - recorded: 33.58 ?Nutritional Status: BMI > 30  Obese ?Nutritional Risks: None ?Diabetes: Yes ?Did pt. bring in CBG monitor from home?: No ? ?How often do you need to have someone help you when you read instructions, pamphlets, or other written materials from your doctor or pharmacy?: 1 - Never ? ?Diabetic?Nutrition Risk Assessment: ? ?Has the patient had  any N/V/D within the last 2 months?  No  ?Does the patient have any non-healing wounds?  No  ?Has the patient had any unintentional weight loss or weight gain?  No  ? ?Diabetes: ? ?Is the patient diabetic?  Yes  ?If diabetic, was a CBG obtained today?  No  ?Did the patient bring in their glucometer from home?  No  ?How often do you monitor your CBG's? 1- 2 times a week.  ? ?Financial Strains and Diabetes Management: ? ?Are you having any financial strains with the device, your supplies or your medication? No .  ?Does the patient want to be seen by Chronic Care Management for management of their diabetes?  No  ?Would the patient like to be referred to a Nutritionist or for Diabetic Management?  No  ? ?Diabetic Exams: ? ?Diabetic Eye Exam: Completed 08/30/20 ?Diabetic Foot Exam: Completed 04/04/21 ? ? ?Interpreter Needed?: No ? ?Information entered by :: Lanier Ensign, LPN ? ? ?Activities of Daily Living ? ?  08/08/2021  ? 11:38 AM 09/02/2020  ?  8:16 AM  ?In your present state of health, do you have any difficulty performing the following activities:  ?Hearing? 0 0  ?Vision? 0 0  ?Difficulty concentrating or making decisions? 0 0  ?Walking or climbing stairs? 0 0  ?Dressing or bathing? 0 0  ?Doing errands, shopping? 0 0  ?Preparing Food and eating ? N   ?Using the Toilet? N   ?In the past six months, have you accidently leaked urine? N   ?Do you have problems with loss of bowel control? N   ?Managing your Medications? N   ?Managing your Finances? N   ?Housekeeping or managing your Housekeeping? N   ? ? ?Patient Care  Team: ?Shelva Majestic, MD as PCP - General (Family Medicine) ?Carlus Pavlov, MD as Consulting Physician (Internal Medicine) ?Molly Maduro Lacretia Leigh, MD as Referring Physician (Internal Medicine) ? ?Ind

## 2021-08-08 NOTE — Patient Instructions (Signed)
[name] , ?Thank you for taking time to come for your Medicare Wellness Visit. I appreciate your ongoing commitment to your health goals. Please review the following plan we discussed and let me know if I can assist you in the future.  ? ?Screening recommendations/referrals: ?Colonoscopy: Done 04/28/16 repeat every 10 years  ?Recommended yearly ophthalmology/optometry visit for glaucoma screening and checkup ?Recommended yearly dental visit for hygiene and checkup ? ?Vaccinations: ?Influenza vaccine: Done 01/08/21 repeat every year  ?Pneumococcal vaccine: Up to date ?Tdap vaccine: Done 4/210/15 repeat every 10 years  ?Shingles vaccine: Completed 02/24/19, 08/26/19   ?Covid-19: Completed 3/5, 06/30/19 , 09/24/20 ? ?Advanced directives: Advance directive discussed with you today. Even though you declined this today please call our office should you change your mind and we can give you the proper paperwork for you to fill out. ? ?Conditions/risks identified: none at this time  ? ?Next appointment: Follow up in one year for your annual wellness visit.  ? ?Preventive Care 66 Years and Older, Male ?Preventive care refers to lifestyle choices and visits with your health care provider that can promote health and wellness. ?What does preventive care include? ?A yearly physical exam. This is also called an annual well check. ?Dental exams once or twice a year. ?Routine eye exams. Ask your health care provider how often you should have your eyes checked. ?Personal lifestyle choices, including: ?Daily care of your teeth and gums. ?Regular physical activity. ?Eating a healthy diet. ?Avoiding tobacco and drug use. ?Limiting alcohol use. ?Practicing safe sex. ?Taking low doses of aspirin every day. ?Taking vitamin and mineral supplements as recommended by your health care provider. ?What happens during an annual well check? ?The services and screenings done by your health care provider during your annual well check will depend on your  age, overall health, lifestyle risk factors, and family history of disease. ?Counseling  ?Your health care provider may ask you questions about your: ?Alcohol use. ?Tobacco use. ?Drug use. ?Emotional well-being. ?Home and relationship well-being. ?Sexual activity. ?Eating habits. ?History of falls. ?Memory and ability to understand (cognition). ?Work and work Astronomer. ?Screening  ?You may have the following tests or measurements: ?Height, weight, and BMI. ?Blood pressure. ?Lipid and cholesterol levels. These may be checked every 5 years, or more frequently if you are over 37 years old. ?Skin check. ?Lung cancer screening. You may have this screening every year starting at age 66 if you have a 30-pack-year history of smoking and currently smoke or have quit within the past 15 years. ?Fecal occult blood test (FOBT) of the stool. You may have this test every year starting at age 66. ?Flexible sigmoidoscopy or colonoscopy. You may have a sigmoidoscopy every 5 years or a colonoscopy every 10 years starting at age 66. ?Prostate cancer screening. Recommendations will vary depending on your family history and other risks. ?Hepatitis C blood test. ?Hepatitis B blood test. ?Sexually transmitted disease (STD) testing. ?Diabetes screening. This is done by checking your blood sugar (glucose) after you have not eaten for a while (fasting). You may have this done every 1-3 years. ?Abdominal aortic aneurysm (AAA) screening. You may need this if you are a current or former smoker. ?Osteoporosis. You may be screened starting at age 66 if you are at high risk. ?Talk with your health care provider about your test results, treatment options, and if necessary, the need for more tests. ?Vaccines  ?Your health care provider may recommend certain vaccines, such as: ?Influenza vaccine. This is recommended every  year. ?Tetanus, diphtheria, and acellular pertussis (Tdap, Td) vaccine. You may need a Td booster every 10 years. ?Zoster  vaccine. You may need this after age 66. ?Pneumococcal 13-valent conjugate (PCV13) vaccine. One dose is recommended after age 66. ?Pneumococcal polysaccharide (PPSV23) vaccine. One dose is recommended after age 66. ?Talk to your health care provider about which screenings and vaccines you need and how often you need them. ?This information is not intended to replace advice given to you by your health care provider. Make sure you discuss any questions you have with your health care provider. ?Document Released: 04/09/2015 Document Revised: 12/01/2015 Document Reviewed: 01/12/2015 ?Elsevier Interactive Patient Education ? 2017 [employer]. ? ?Fall Prevention in the Home ?Falls can cause injuries. They can happen to people of all ages. There are many things you can do to make your home safe and to help prevent falls. ?What can I do on the outside of my home? ?Regularly fix the edges of walkways and driveways and fix any cracks. ?Remove anything that might make you trip as you walk through a door, such as a raised step or threshold. ?Trim any bushes or trees on the path to your home. ?Use bright outdoor lighting. ?Clear any walking paths of anything that might make someone trip, such as rocks or tools. ?Regularly check to see if handrails are loose or broken. Make sure that both sides of any steps have handrails. ?Any raised decks and porches should have guardrails on the edges. ?Have any leaves, snow, or ice cleared regularly. ?Use sand or salt on walking paths during winter. ?Clean up any spills in your garage right away. This includes oil or grease spills. ?What can I do in the bathroom? ?Use night lights. ?Install grab bars by the toilet and in the tub and shower. Do not use towel bars as grab bars. ?Use non-skid mats or decals in the tub or shower. ?If you need to sit down in the shower, use a plastic, non-slip stool. ?Keep the floor dry. Clean up any water that spills on the floor as soon as it happens. ?Remove  soap buildup in the tub or shower regularly. ?Attach bath mats securely with double-sided non-slip rug tape. ?Do not have throw rugs and other things on the floor that can make you trip. ?What can I do in the bedroom? ?Use night lights. ?Make sure that you have a light by your bed that is easy to reach. ?Do not use any sheets or blankets that are too big for your bed. They should not hang down onto the floor. ?Have a firm chair that has side arms. You can use this for support while you get dressed. ?Do not have throw rugs and other things on the floor that can make you trip. ?What can I do in the kitchen? ?Clean up any spills right away. ?Avoid walking on wet floors. ?Keep items that you use a lot in easy-to-reach places. ?If you need to reach something above you, use a strong step stool that has a grab bar. ?Keep electrical cords out of the way. ?Do not use floor polish or wax that makes floors slippery. If you must use wax, use non-skid floor wax. ?Do not have throw rugs and other things on the floor that can make you trip. ?What can I do with my stairs? ?Do not leave any items on the stairs. ?Make sure that there are handrails on both sides of the stairs and use them. Fix handrails that are broken or  loose. Make sure that handrails are as long as the stairways. ?Check any carpeting to make sure that it is firmly attached to the stairs. Fix any carpet that is loose or worn. ?Avoid having throw rugs at the top or bottom of the stairs. If you do have throw rugs, attach them to the floor with carpet tape. ?Make sure that you have a light switch at the top of the stairs and the bottom of the stairs. If you do not have them, ask someone to add them for you. ?What else can I do to help prevent falls? ?Wear shoes that: ?Do not have high heels. ?Have rubber bottoms. ?Are comfortable and fit you well. ?Are closed at the toe. Do not wear sandals. ?If you use a stepladder: ?Make sure that it is fully opened. Do not climb a  closed stepladder. ?Make sure that both sides of the stepladder are locked into place. ?Ask someone to hold it for you, if possible. ?Clearly mark and make sure that you can see: ?Any grab bars or handrails. ?

## 2021-08-18 ENCOUNTER — Other Ambulatory Visit: Payer: Self-pay | Admitting: Internal Medicine

## 2021-08-18 ENCOUNTER — Telehealth: Payer: Self-pay

## 2021-08-18 MED ORDER — FLUCONAZOLE 150 MG PO TABS: 150.0000 mg | ORAL_TABLET | Freq: Once | ORAL | 1 refills | Status: DC

## 2021-08-18 NOTE — Telephone Encounter (Signed)
Pt requested a rx be sent in for a yeast infection he has from his Jardiance.

## 2021-08-18 NOTE — Telephone Encounter (Signed)
I sent it  

## 2021-08-19 ENCOUNTER — Other Ambulatory Visit: Payer: Self-pay | Admitting: Internal Medicine

## 2021-08-19 DIAGNOSIS — Z794 Long term (current) use of insulin: Secondary | ICD-10-CM

## 2021-08-19 DIAGNOSIS — E1159 Type 2 diabetes mellitus with other circulatory complications: Secondary | ICD-10-CM

## 2021-08-22 IMAGING — US US THYROID
1 series · 13 of 25 positions shown · non-contrast
Comparison: None.

CLINICAL DATA: Goiter.

Follow-up nodules
EXAM:
THYROID ULTRASOUND
TECHNIQUE: Ultrasound examination of the thyroid gland and adjacent soft
tissues was performed.

[Series 1: us thyroid · 0.06mm/px · 13 of 64 slices shown]
[im 1/64]
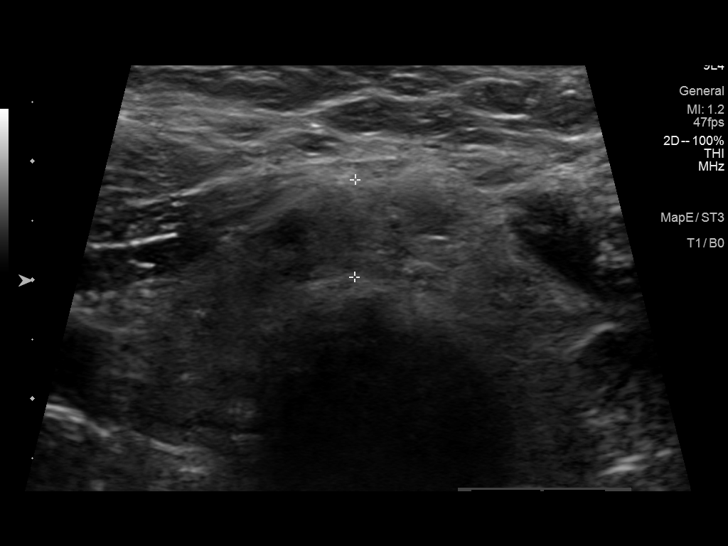
[im 6/64]
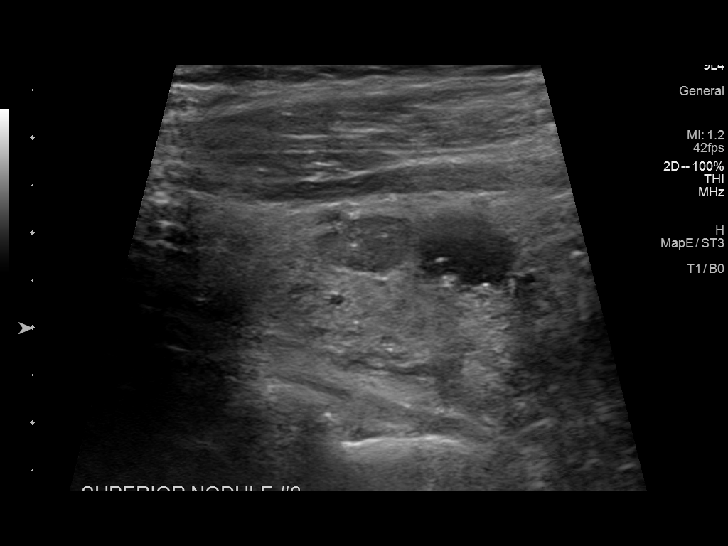
[im 11/64]
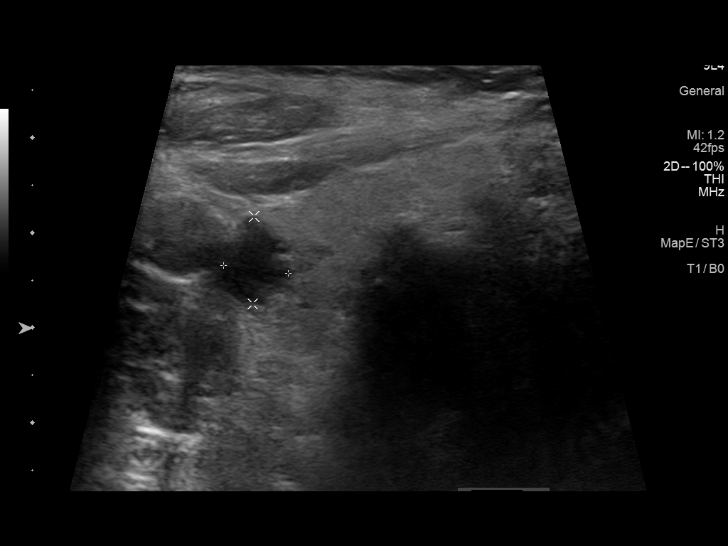
[im 16/64]
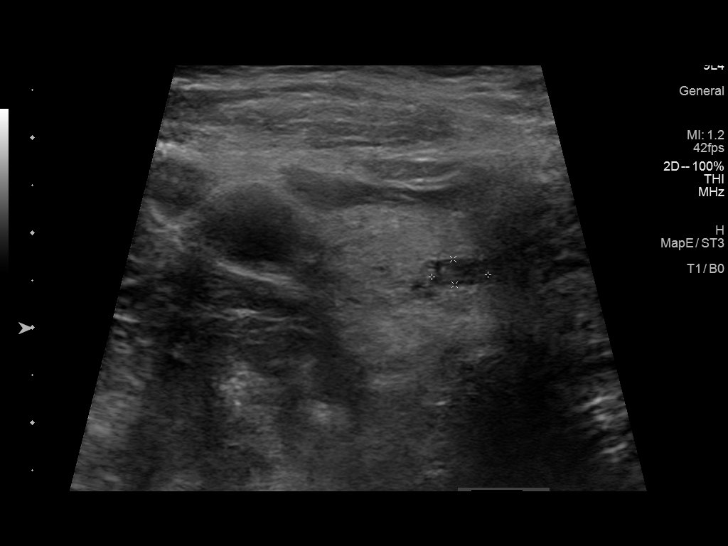
[im 22/64]
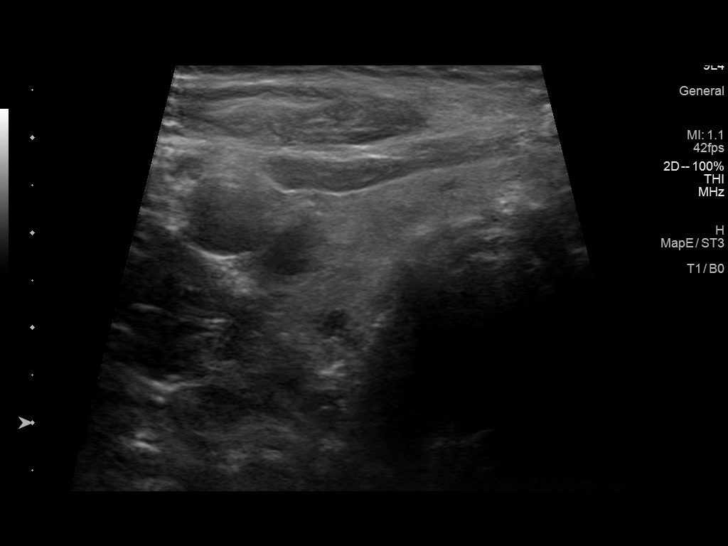
[im 27/64]
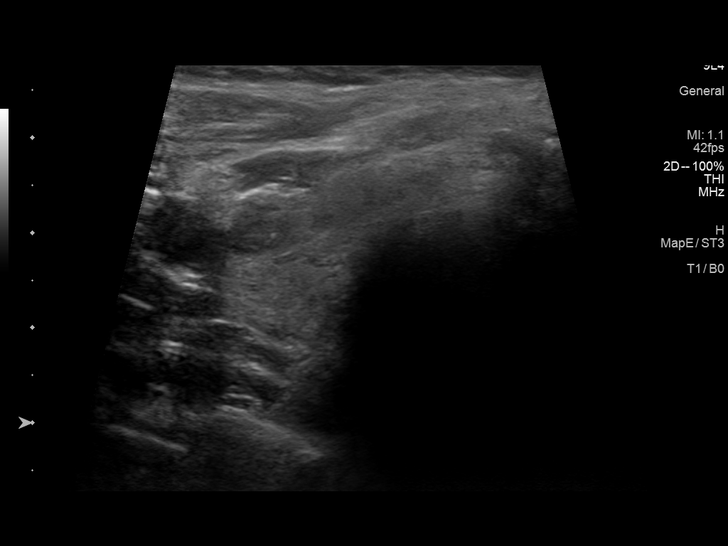
[im 32/64]
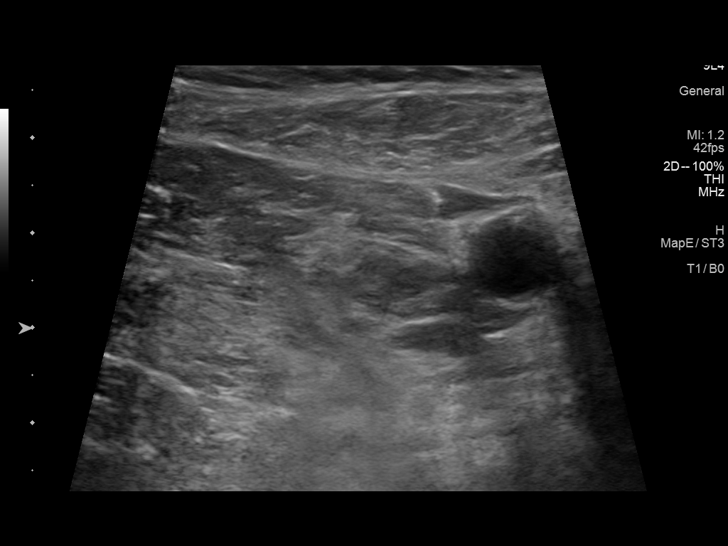
[im 37/64]
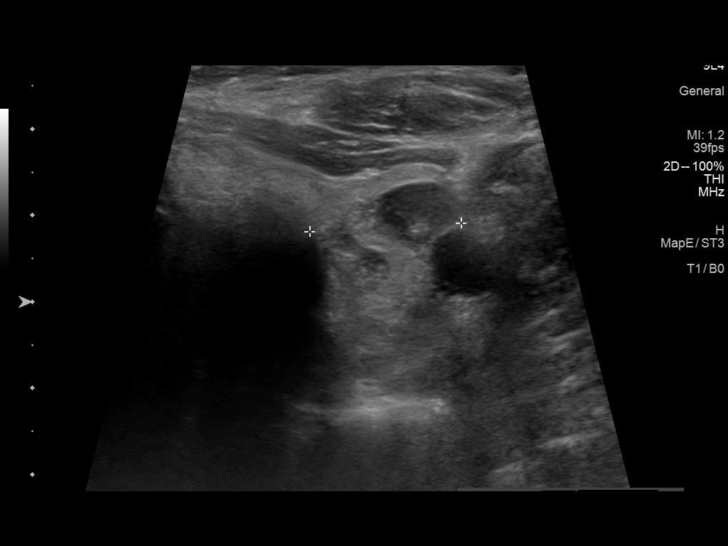
[im 43/64]
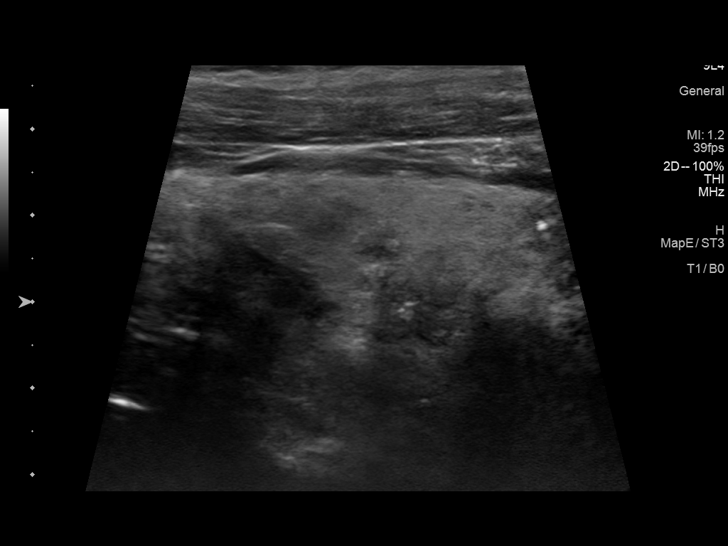
[im 48/64]
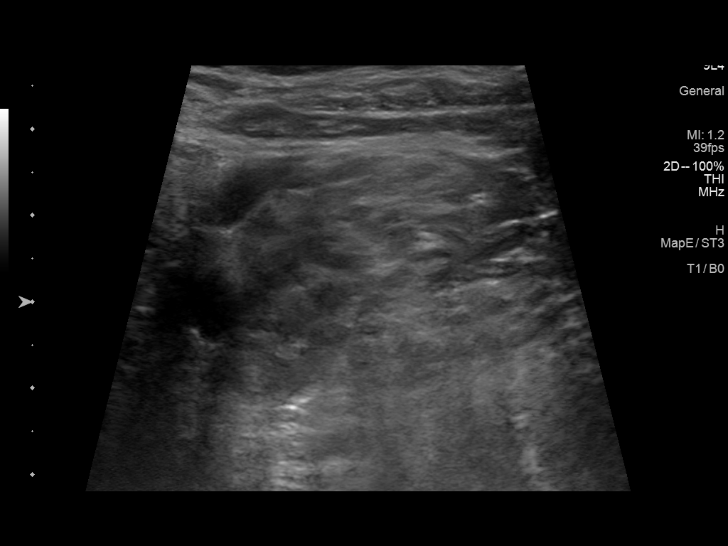
[im 53/64]
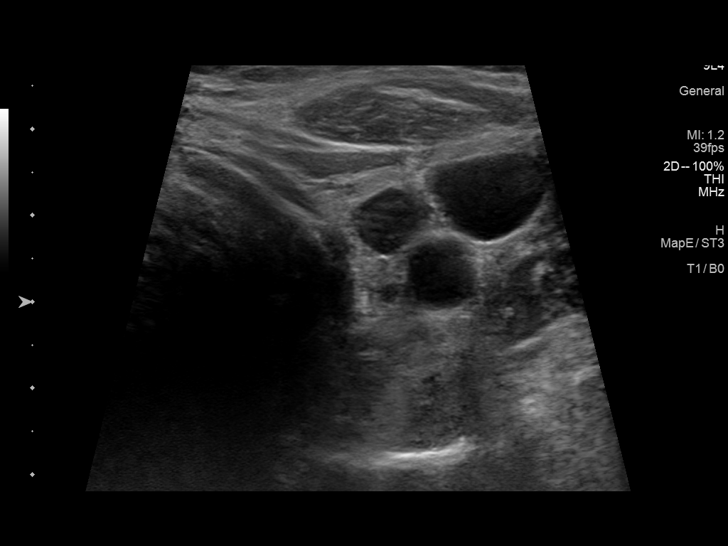
[im 58/64]
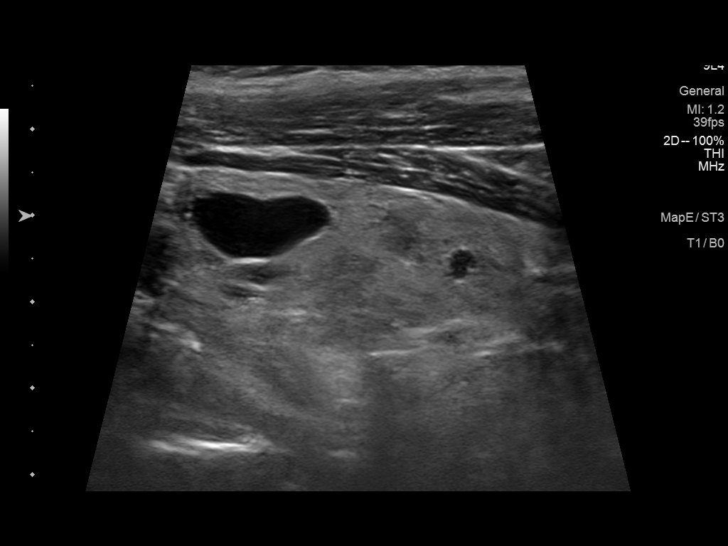
[im 64/64]
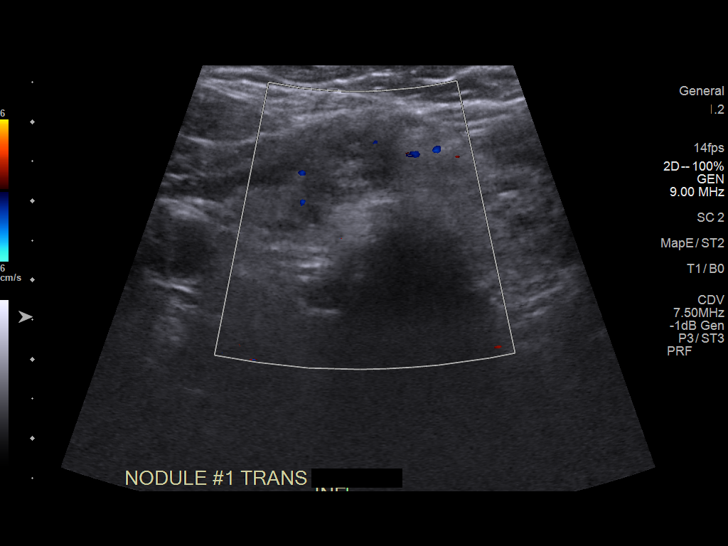

[13 of 25 positions shown; findings below may reference images not displayed]

FINDINGS: Parenchymal Echotexture: Moderately heterogeneous

Isthmus: 0.8 cm

Right lobe: 4.4 x 2.0 x 1.6 cm

Left lobe: 4.8 x 2.0 x 1.8 cm

_________________________________________________________

Estimated total number of nodules >/= 1 cm: 4

Number of spongiform nodules >/=  2 cm not described below (TR1): 0

Number of mixed cystic and solid nodules >/= 1.5 cm not described
below (TR2): 0

_________________________________________________________

Nodule 1: 1.1 x 1.1 x 0.7 cm nodule located in the isthmus has
decreased in size since prior examination where it measured 1.9 x
1.7 x 1.3 cm. FNA of this nodule was performed on 02/12/2019. Please
correlate with results.

Nodule 2: 1.0 x 0.9 x 0.6 cm nodule located in the superior right
thyroid lobe is not significantly changed in size measuring 1.0 x
0.7 x 0.6 cm on the prior exam. FNA of this nodule was performed on
02/12/2019. Please correlate with results.

Nodule 3: 1.2 x 0.9 x 0.7 cm cystic nodule located in the mid right
thyroid lobe is not significantly changed in size since prior exam
where it measured 1.1 x 0.8 x 0.8 cm. It does not meet criteria for
FNA or imaging follow-up.

Nodule 4: 0.6 x 0.6 x 0.3 cm solid hypoechoic nodule in the inferior
right thyroid lobe is new since the prior examination. It does not
meet criteria for FNA or imaging follow-up.

Nodule 5: 1.6 x 0.9 x 0.8 cm cystic nodule in the superior left
thyroid lobe is not significantly changed in size since prior
examination when where measured 1.5 x 1.0 x 0.9 cm. This does not
meet criteria for FNA or imaging follow-up.

Nodule 6: 0.8 x 0.7 x 0.6 cm solid isoechoic nodule in the mid left
thyroid lobe previously measured 1.2 x 1.0 x 1.3 cm. Interval
decrease in size favors a benign etiology. This nodule does not meet
criteria for imaging follow-up or FNA.

_________________________________________________________
IMPRESSION: Bilateral thyroid nodules.

Nodules 1 and 2 were previously biopsied. Please correlate with
results.

Remaining nodules do not meet criteria for FNA or imaging follow-up.

The above is in keeping with the ACR TI-RADS recommendations - [HOSPITAL] 7073;[DATE].

## 2021-09-07 ENCOUNTER — Other Ambulatory Visit: Payer: Self-pay | Admitting: Family Medicine

## 2021-09-15 ENCOUNTER — Other Ambulatory Visit (HOSPITAL_COMMUNITY): Payer: Self-pay

## 2021-09-15 ENCOUNTER — Telehealth: Payer: Self-pay | Admitting: Pharmacy Technician

## 2021-09-15 NOTE — Telephone Encounter (Signed)
Patient Advocate Encounter  Received notification from [employer] that the request for prior authorization for Insulin Lispro has been denied due to the patient has not tried or failed a formulary alternative used to treat the condition, nor has the supporting statement provided indicate why the use of a formulary product would be inappropriate.  Per test claim too soon to fill.

## 2021-09-17 ENCOUNTER — Other Ambulatory Visit: Payer: Self-pay | Admitting: Family Medicine

## 2021-09-17 ENCOUNTER — Other Ambulatory Visit: Payer: Self-pay | Admitting: Internal Medicine

## 2021-10-03 ENCOUNTER — Ambulatory Visit (INDEPENDENT_AMBULATORY_CARE_PROVIDER_SITE_OTHER): Payer: HMO | Admitting: Family Medicine

## 2021-10-03 ENCOUNTER — Other Ambulatory Visit: Payer: Self-pay | Admitting: Internal Medicine

## 2021-10-03 ENCOUNTER — Encounter: Payer: Self-pay | Admitting: Family Medicine

## 2021-10-03 VITALS — BP 122/60 | HR 88 | Temp 98.2°F | Ht 70.5 in | Wt 237.0 lb

## 2021-10-03 DIAGNOSIS — E1159 Type 2 diabetes mellitus with other circulatory complications: Secondary | ICD-10-CM

## 2021-10-03 DIAGNOSIS — I2510 Atherosclerotic heart disease of native coronary artery without angina pectoris: Secondary | ICD-10-CM | POA: Diagnosis not present

## 2021-10-03 DIAGNOSIS — I10 Essential (primary) hypertension: Secondary | ICD-10-CM | POA: Diagnosis not present

## 2021-10-03 DIAGNOSIS — E785 Hyperlipidemia, unspecified: Secondary | ICD-10-CM

## 2021-10-03 DIAGNOSIS — M791 Myalgia, unspecified site: Secondary | ICD-10-CM

## 2021-10-03 DIAGNOSIS — Z794 Long term (current) use of insulin: Secondary | ICD-10-CM

## 2021-10-03 DIAGNOSIS — Z79899 Other long term (current) drug therapy: Secondary | ICD-10-CM | POA: Diagnosis not present

## 2021-10-03 MED ORDER — ATORVASTATIN CALCIUM 40 MG PO TABS
40.0000 mg | ORAL_TABLET | Freq: Every day | ORAL | 3 refills | Status: DC
Start: 2021-10-03 — End: 2022-10-24

## 2021-10-03 NOTE — Progress Notes (Signed)
Phone [phone] In person visit   Subjective:   Zachary Warner is a 66 y.o. year old very pleasant male patient who presents for/with See problem oriented charting Chief Complaint  Patient presents with   Follow-up   Hyperlipidemia   Diabetes   Past Medical History-  Patient Active Problem List   Diagnosis Date Noted   Diabetic retinopathy (HCC) 02/12/2017    Priority: High   CAD (coronary artery disease) status post PTCA and stent to obtuse marginal branch in 2016 08/18/2014    Priority: High   DM (diabetes mellitus) (HCC) 02/04/2006    Priority: High   Hyperlipidemia 02/04/2006    Priority: Medium    Essential hypertension 02/04/2006    Priority: Medium    Nephrolithiasis     Priority: Low   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     carvedilol (COREG) 3.125 MG tablet Take 1 tablet (3.125 mg total) by mouth 2 times daily. 60 tablet 3   EQ FIBER SUPPLEMENT PO Take 1 tablet by mouth daily. Unknown strength     fluticasone (FLONASE) 50 MCG/ACT nasal spray Place 1 spray into both nostrils daily as needed for allergies or rhinitis.     insulin aspart (NOVOLOG FLEXPEN) 100 UNIT/ML FlexPen Inject 10-20 Units into the skin 3 (three) times daily with meals. 30 mL 3   insulin lispro (HUMALOG KWIKPEN) 100 UNIT/ML KwikPen INJECT 28-48 UNITS SUBCUTANEOUSLY THREE TIMES DAILY 45 mL 3   JARDIANCE 25 MG TABS tablet Take 1 tablet (25 mg total) by mouth daily. (Patient taking differently: Take 25 mg by mouth daily.) 90 tablet 5   lisinopril (ZESTRIL) 40 MG tablet Take 1 tablet (40  mg total) by mouth daily. 30 tablet 3   metFORMIN (GLUCOPHAGE) 1000 MG tablet Take 1 tablet (1,000 mg total) by mouth 2 (two) times daily with a meal. 180 tablet 3   nitroGLYCERIN (NITROSTAT) 0.4 MG SL tablet PLACE 1 TABLET UNDER TONGUE EVERY 5 MINUTES AS NEEDED FOR CHEST PAIN. CALL 911/DOCTOR IF YOU TAKE 2 TABLETS. max of 3 tablets/day. 25 tablet 3   Omega-3 Fatty Acids (FISH OIL) 1000 MG CAPS Take 4 capsules by mouth daily.     OZEMPIC, 1 MG/DOSE, 4 MG/3ML SOPN Inject 1 mg into the skin once a week. 9 mL 3   TOUJEO MAX SOLOSTAR 300 UNIT/ML Solostar Pen Inject 80 Units into the skin daily. 24 mL 3   vitamin B-12 (CYANOCOBALAMIN) 500 MCG tablet Take 500 mcg by mouth daily.     atorvastatin (LIPITOR) 40 MG tablet Take 1 tablet (40 mg total) by mouth daily. 90 tablet 3   No current facility-administered medications for this visit.     Objective:  BP 122/60   Pulse 88   Temp 98.2 F (36.8 C)   Ht 5' 10.5" (1.791 m)   Wt 237 lb (107.5 kg)   SpO2 96%   BMI 33.53 kg/m  Gen: NAD, resting comfortably CV: RRR no murmurs rubs or gallops Lungs: CTAB no crackles, wheeze, rhonchi Ext: no edema Skin: warm, dry  Assessment and Plan   #Social update-wife Dondra Spry continues to do well after surgery last year with Dr. Cliffton Asters cardiothoracic and Thyroid cancer surgery. They even went on cruise to [place] recently. She is even back to work  %# CAD- follows with Dr. Christene Lye cardiology in the past now with Dr. Bing Matter with [facility] S:compliant with aspirin and statin.  Symptoms-no chest pain or shortness of breath  1 stent 2015 A/P: CAD asymptomatic- continue current meds LDL goal under 70-slightly high in January- has been out for 3-4 weeks and needs refill   #hypertension S: compliant with coreg 3.125mg  BID, lisinopril 40mg   BP Readings from Last 3 Encounters:  10/03/21 122/60  08/02/21 110/74  06/10/21 110/60  A/P: Controlled. Continue current medications.     #hyperlipidemia  with myalgia history S: previously mild poorly controlled on atorvastatin 40mg  every other day with LDL above 70- he had gone up to every 2 days then 1 day off but caused him to run out of medicine- we will refill as daily to avoid this.   - right sided myalgia in 2020 when went up to daily -Weight stable from January Lab Results  Component Value Date   CHOL 114 12/03/2020   HDL 29 (L) 12/03/2020   LDLCALC 59 12/03/2020   LDLDIRECT 82.0 04/04/2021   TRIG 152 (H) 12/03/2020   CHOLHDL 3.9 12/03/2020   A/P: mild poor control- he tried ot increase dose but ran out based on last prescription- we will send in 40mg  atorvastatin daily with 90 day supply to try to avoid this but allow him to gradually increase med- he is going to go back to every 2 days then 1 day off and try to continue to work up .   %# Diabetes with history of diabetic retinopathy S: followed closely by Dr. 02/02/2021.  Improved A1c recently  Patient compliant with basal insulin  for basal with 80 units per day, lispro BID at least 25,30, 40  units. now on ozempic as well. Orally on jardiance 25mg , metformin 1000mg  BID.   Has history retinopathy- last check on file from June 2022-patient reports needs to schedule  Lab Results  Component Value Date   HGBA1C 7.0 (A) 08/02/2021   HGBA1C 7.4 (H) 04/04/2021   HGBA1C 7.5 (A) 11/22/2020   A/P: diabetes well controlled- continue current meds and Dr. follow up- he agrees to call to schedule updated eye exam -On metformin review prior B12 and noted but he is taking b12 daily    Lab Results  Component Value Date   VITAMINB12 290 02/18/2018    #Plantar fasciitis left foot- plantar sock and tens unit helpful at him  Recommended follow up: Return in about 6 months (around 04/05/2022) for physical or sooner if needed.Schedule b4 you leave. Future Appointments  Date Time Provider Department Center  12/12/2021  1:00 PM 11/24/2020, MD [facility] None  12/14/2021  9:00 AM  02/20/2018, MD [facility] None  08/21/2022 11:30 AM [facility] COACH [facility]    Lab/Order associations:   ICD-10-CM   1. Coronary artery disease involving native coronary artery of native heart without angina pectoris  I25.10     2. Type 2 diabetes mellitus with other circulatory complication, without long-term current use of insulin (HCC)  E11.59     3. Hyperlipidemia, unspecified hyperlipidemia type  E78.5 CBC with Differential/Platelet    Comprehensive metabolic panel    4. Essential hypertension  I10     5. Myalgia  M79.10     6. High risk medication use  Z79.899 Vitamin B12      Meds ordered this encounter  Medications   atorvastatin (LIPITOR) 40 MG tablet    Sig: Take 1 tablet (40 mg total) by mouth daily.    Dispense:  90 tablet    Refill:  3    Return precautions advised.  [name], MD

## 2021-10-03 NOTE — Patient Instructions (Addendum)
Health Maintenance Due  Topic Date Due   OPHTHALMOLOGY EXAM -get scheduled 08/30/2021   Please stop by lab before you go If you have mychart- we will send your results within 3 business days of [place] receiving them.  If you do not have mychart- we will call you about results within 5 business days of [place] receiving them.  *please also note that you will see labs on mychart as soon as they post. I will later go in and write notes on them- will say "notes from Dr. Durene Cal"   Recommended follow up: Return in about 6 months (around 04/05/2022) for physical or sooner if needed.Schedule b4 you leave.

## 2021-10-06 ENCOUNTER — Other Ambulatory Visit: Payer: HMO

## 2021-10-06 LAB — COMPREHENSIVE METABOLIC PANEL
ALT: 18 U/L (ref 0–53)
AST: 15 U/L (ref 0–37)
Albumin: 4.3 g/dL (ref 3.5–5.2)
Alkaline Phosphatase: 49 U/L (ref 39–117)
BUN: 17 mg/dL (ref 6–23)
CO2: 26 mEq/L (ref 19–32)
Calcium: 9.4 mg/dL (ref 8.4–10.5)
Chloride: 104 mEq/L (ref 96–112)
Creatinine, Ser: 1.13 mg/dL (ref 0.40–1.50)
GFR: 67.89 mL/min (ref >60.00)
Glucose, Bld: 94 mg/dL (ref 70–99)
Potassium: 4.2 mEq/L (ref 3.5–5.1)
Sodium: 139 mEq/L (ref 135–145)
Total Bilirubin: 0.6 mg/dL (ref 0.2–1.2)
Total Protein: 7 g/dL (ref 6.0–8.3)

## 2021-10-06 LAB — CBC WITH DIFFERENTIAL/PLATELET
Basophils Absolute: 0 10*3/uL (ref 0.0–0.1)
Basophils Relative: 0.7 % (ref 0.0–3.0)
Eosinophils Absolute: 0.2 10*3/uL (ref 0.0–0.7)
Eosinophils Relative: 4.3 % (ref 0.0–5.0)
HCT: 42.7 % (ref 39.0–52.0)
Hemoglobin: 14.2 g/dL (ref 13.0–17.0)
Lymphocytes Relative: 41.6 % (ref 12.0–46.0)
Lymphs Abs: 2.4 10*3/uL (ref 0.7–4.0)
MCHC: 33.2 g/dL (ref 30.0–36.0)
MCV: 86.9 fl (ref 78.0–100.0)
Monocytes Absolute: 0.5 10*3/uL (ref 0.1–1.0)
Monocytes Relative: 9.2 % (ref 3.0–12.0)
Neutro Abs: 2.5 10*3/uL (ref 1.4–7.7)
Neutrophils Relative %: 44.2 % (ref 43.0–77.0)
Platelets: 186 10*3/uL (ref 150.0–400.0)
RBC: 4.91 Mil/uL (ref 4.22–5.81)
RDW: 15.1 % (ref 11.5–15.5)
WBC: 5.7 10*3/uL (ref 4.0–10.5)

## 2021-10-06 LAB — VITAMIN B12: Vitamin B-12: >1504 pg/mL — ABNORMAL HIGH (ref 211–911)

## 2021-11-18 ENCOUNTER — Other Ambulatory Visit: Payer: Self-pay | Admitting: Internal Medicine

## 2021-11-20 ENCOUNTER — Other Ambulatory Visit: Payer: Self-pay | Admitting: Internal Medicine

## 2021-11-20 DIAGNOSIS — E1159 Type 2 diabetes mellitus with other circulatory complications: Secondary | ICD-10-CM

## 2021-11-23 ENCOUNTER — Telehealth: Payer: Self-pay | Admitting: Family Medicine

## 2021-11-23 NOTE — Telephone Encounter (Signed)
Patient Name: Zachary Warner Gender: Male DOB: May 04, 1955 Age: 66 Y 3 M 16 D Return Phone Number: [phone] (Primary), [phone] (Secondary) Address: City/ State/ Zip: [place] [address] Client [facility] - Administrator, [facility] at [facility] Provider [name]- MD Contact Type Call Who Is Calling Patient / Member / Family / Caregiver Call Type Triage / Clinical Relationship To Patient Self Return Phone Number [phone] (Primary) Chief Complaint Abdominal Pain Reason for Call Symptomatic / Request for Health Information Initial Comment Caller states he has abdominal pain since yesterday. It was 3 out of 10 and feels like it is a stabbing pain right under the pain, possible gall bladder. He is also nausea. GOTO Facility Not Listed [facility] Translation No Nurse Assessment Nurse: Kizzie Bane, RN, Marylene Land Date/Time Lamount Cohen Time): 11/23/2021 11:55:57 AM Confirm and document reason for call. If symptomatic, describe symptoms. ---Caller states he is having abdominal pain that started yesterday. He feels nauseated Does the patient have any new or worsening symptoms? ---Yes Will a triage be completed? ---Yes Related visit to physician within the last 2 weeks? ---No Does the PT have any chronic conditions? (i.e. diabetes, asthma, this includes High risk factors for pregnancy, etc.) ---Yes List chronic conditions. ---diabetes, heart disease, hypertension Is this a behavioral health or substance abuse call? ---No Guidelines Guideline Title Affirmed Question Affirmed Notes Nurse Date/Time Lamount Cohen Time) Abdominal Pain - Upper [1] Pain lasts > 10 minutes AND [2] age > 61 Kizzie Bane, RN, Marylene Land 11/23/2021 11:57:53 AM PLEASE NOTE: All timestamps contained within this report are represented as [place] Standard Time. CONFIDENTIALTY NOTICE: This fax transmission is intended only for the addressee. It contains  information that is legally privileged, confidential or otherwise protected from use or disclosure. If you are not the intended recipient, you are strictly prohibited from reviewing, disclosing, copying using or disseminating any of this information or taking any action in reliance on or regarding this information. If you have received this fax in error, please notify us immediately by telephone so that we can arrange for its return to [place]. Phone: [phone], Toll-Free: [phone], Fax: [phone] Page: 2 of 2 Call Id: [id number] Disp. Time Lamount Cohen Time) Disposition Final User 11/23/2021 12:00:17 PM Go to ED Now Yes Kizzie Bane, RN, Marylene Land Final Disposition 11/23/2021 12:00:17 PM Go to ED Now Yes Kizzie Bane, RN, Rosalyn Charters Disagree/Comply Comply Caller Understands Yes PreDisposition Did not know what to do Care Advice Given Per Guideline GO TO ED NOW: * You need to be seen in the Emergency Department. * Go to the ED at ___________ Hospital. * Leave now. Drive carefully. * Another adult should drive. * Patient should not delay going to the emergency department. CARE ADVICE given per Abdominal Pain - Upper (Adult) guideline. * Passes out or becomes too weak to stand * Confusion occurs * Severe difficulty breathing occurs. CALL EMS 911 IF: Referrals GO TO FACILITY OTHER - SPECIFY

## 2021-11-23 NOTE — Telephone Encounter (Signed)
Abdominal pain since yesterday - pain reached a max of 3/10 - describes a stabbing pain - states his wife is a Engineer, civil (consulting) and thinks its gallbladder.  Patient is now nauseous .Patient being triaged.

## 2021-11-24 NOTE — Telephone Encounter (Signed)
Ok to schedule pt?

## 2021-11-24 NOTE — Telephone Encounter (Signed)
Yes may schedule.  

## 2021-11-24 NOTE — Telephone Encounter (Signed)
Please schedule pt f/u for this.

## 2021-11-24 NOTE — Telephone Encounter (Signed)
According to chart, patient has not gone to ED. If patient has not gone to ED for evaluation, can he still be seen for abdominal pain? Please Advise.

## 2021-12-05 ENCOUNTER — Encounter: Payer: Self-pay | Admitting: Family Medicine

## 2021-12-05 ENCOUNTER — Ambulatory Visit (INDEPENDENT_AMBULATORY_CARE_PROVIDER_SITE_OTHER): Payer: HMO | Admitting: Family Medicine

## 2021-12-05 VITALS — BP 120/62 | HR 92 | Temp 98.2°F | Ht 70.5 in | Wt 239.2 lb

## 2021-12-05 DIAGNOSIS — I10 Essential (primary) hypertension: Secondary | ICD-10-CM | POA: Diagnosis not present

## 2021-12-05 DIAGNOSIS — R1011 Right upper quadrant pain: Secondary | ICD-10-CM

## 2021-12-05 DIAGNOSIS — E785 Hyperlipidemia, unspecified: Secondary | ICD-10-CM | POA: Diagnosis not present

## 2021-12-05 NOTE — Patient Instructions (Addendum)
Please schedule diabetic eye exam and have it sent to [place] at [phone].  Flu shot (high dose) we should have these available within a month or two but please let us know if you get at outside pharmacy.  We will call you within two weeks about your referral for right upper quadrant ultrasound through [facility].  Their phone number is [phone].  Please call them if you have not heard in 1-2 weeks  Recommended follow up: Return for next already scheduled visit or sooner if needed.  -if worsening symptoms please update [place] or if new symptoms develop we want to know

## 2021-12-05 NOTE — Progress Notes (Signed)
Phone [phone] In person visit   Subjective:   Zachary Warner is a 66 y.o. year old very pleasant male patient who presents for/with See problem oriented charting Chief Complaint  Patient presents with   Follow-up    Pt is here to f/u on abd pain that has now resolved, would like to know if needs abd [place].   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     atorvastatin (LIPITOR) 40 MG tablet Take 1 tablet (40 mg total) by mouth daily. 90 tablet 3   carvedilol (COREG) 3.125 MG tablet Take 1 tablet (3.125 mg total) by mouth 2 times daily. 60 tablet 3   EQ FIBER SUPPLEMENT PO Take 1 tablet by mouth daily. Unknown strength     fluticasone (FLONASE) 50 MCG/ACT nasal spray Place 1 spray into both nostrils daily as needed for allergies or rhinitis.     insulin aspart (NOVOLOG FLEXPEN) 100 UNIT/ML FlexPen Inject 10-20 Units into the skin 3 (three) times daily with meals. 30 mL 3   insulin lispro (HUMALOG KWIKPEN) 100 UNIT/ML KwikPen INJECT 28-48 UNITS SUBCUTANEOUSLY THREE TIMES DAILY 45 mL 3   JARDIANCE 25 MG TABS tablet TAKE ONE  TABLET (25mg ) BY MOUTH DAILY 90 tablet 5   lisinopril (ZESTRIL) 40 MG tablet Take 1 tablet (40 mg total) by mouth daily. 30 tablet 3   metFORMIN (GLUCOPHAGE) 1000 MG tablet Take 1 tablet (1,000 mg total) by mouth 2 (two) times daily with a meal. 180 tablet 3   nitroGLYCERIN (NITROSTAT) 0.4 MG SL tablet PLACE 1 TABLET UNDER TONGUE EVERY 5 MINUTES AS NEEDED FOR CHEST PAIN. CALL 911/DOCTOR IF YOU TAKE 2 TABLETS. max of 3 tablets/day. 25 tablet 3   Omega-3 Fatty Acids (FISH OIL) 1000 MG CAPS Take 4 capsules by mouth daily.     OZEMPIC, 1 MG/DOSE, 4 MG/3ML SOPN Inject 1 mg into the skin once a week. 9 mL 3   TOUJEO MAX SOLOSTAR 300 UNIT/ML Solostar Pen Inject 80 Units into the skin daily. 24 mL 3   vitamin B-12 (CYANOCOBALAMIN) 500 MCG tablet Take 500 mcg by mouth daily.     No current facility-administered medications for this visit.     Objective:  BP 120/62   Pulse 92   Temp 98.2 F (36.8 C)   Ht 5' 10.5" (1.791 m)   Wt 239 lb 3.2 oz (108.5 kg)   SpO2 96%   BMI 33.84 kg/m  Gen: NAD, resting comfortably CV: RRR no murmurs rubs or gallops Lungs: CTAB no crackles, wheeze, rhonchi  Abdomen: soft/nontender even with deep palpation/nondistended/normal bowel sounds. No rebound or guarding. Umbilical hernia stable nontender Ext: no edema Skin: warm, dry     Assessment and Plan   # RUQ abdominal pain follow up  S:patient reports abdominal pain starting 11/22/21 3/10 pain stabbing sensation in right upper quadrant associated with nausea. Thankfully has resolved in intensity but notes some lingering soreness- slight twinge in intensity such as today . For a month prior had noted some mild abdominal soreness around RUQ pain - still has his gallbladder - no chest pain or shortness of breath -no melena or brbpr -no nausea outside day of call A/P: RUQ pain for about 6 weeks- intensified on 11/22/21- wonder about underlying gallstones- I agree on need for ultrasound  -opts out of repeat bloodwork-  just had done 10/06/21 -doubt cardiac as below  %# CAD- follows with Dr. Christene Lye cardiology in the past now with Dr. Bing Matter with [facility] MG S:compliant with aspirin and statin.  Symptoms-as above no CP or shortness of breath. No exertional element to pain 1 stent 2015 A/P: I do not think the pain in RUQ represents cardiac pain- appears stabe- continue current meds. Walked for golf with carts on path only and no worsening RUQ pain   #hypertension S: compliant with coreg 3.125mg  BID, lisinopril 40mg   A/P: Controlled. Continue current medications.   #hyperlipidemia S: previously mild poorly controlled on atorvastatin 40mg  every other day with LDL above 70.  On 02/24/2019 we increased dose to 40 mg daily but had to decrease back down to skipping every 3rd day due to low back pain - which improved Lab Results  Component Value Date   CHOL 114 12/03/2020   HDL 29 (L) 12/03/2020   LDLCALC 59 12/03/2020   LDLDIRECT 82.0 04/04/2021   TRIG 152 (H) 12/03/2020   CHOLHDL 3.9 12/03/2020   A/P: #s looked good last visit- we will recheck lipids at coming visit in January since he wants to hold off on meds for now  Recommended follow up: Return for next already scheduled visit or sooner if needed. Future Appointments  Date Time Provider Department Center  12/12/2021  1:00 PM February, MD [facility] None  12/14/2021  9:00 AM Georgeanna Lea, MD [facility] None  04/07/2022  9:00 AM Carlus Pavlov, MD [facility]  08/21/2022 11:30 AM [facility] HEALTH COACH [facility]   Lab/Order associations:   ICD-10-CM   1. RUQ pain  R10.11 Shelva Majestic Abdomen Limited RUQ (LIVER/GB)    2. Essential hypertension  I10     3. Hyperlipidemia, unspecified hyperlipidemia type  E78.5      Return precautions advised.  08/23/2022, MD

## 2021-12-07 ENCOUNTER — Ambulatory Visit
Admission: RE | Admit: 2021-12-07 | Discharge: 2021-12-07 | Disposition: A | Discharge status: Home / Self Care | Payer: HMO | Source: Ambulatory Visit | Attending: Family Medicine | Admitting: Family Medicine

## 2021-12-07 DIAGNOSIS — R1011 Right upper quadrant pain: Secondary | ICD-10-CM | POA: Diagnosis not present

## 2021-12-12 ENCOUNTER — Ambulatory Visit: Payer: HMO | Attending: Cardiology | Admitting: Cardiology

## 2021-12-12 ENCOUNTER — Encounter: Payer: Self-pay | Admitting: Cardiology

## 2021-12-12 VITALS — BP 120/70 | HR 97 | Ht 70.0 in | Wt 237.4 lb

## 2021-12-12 DIAGNOSIS — I10 Essential (primary) hypertension: Secondary | ICD-10-CM | POA: Diagnosis not present

## 2021-12-12 DIAGNOSIS — R9431 Abnormal electrocardiogram [ECG] [EKG]: Secondary | ICD-10-CM | POA: Diagnosis not present

## 2021-12-12 DIAGNOSIS — I2510 Atherosclerotic heart disease of native coronary artery without angina pectoris: Secondary | ICD-10-CM

## 2021-12-12 DIAGNOSIS — E782 Mixed hyperlipidemia: Secondary | ICD-10-CM | POA: Diagnosis not present

## 2021-12-12 DIAGNOSIS — E1159 Type 2 diabetes mellitus with other circulatory complications: Secondary | ICD-10-CM

## 2021-12-12 NOTE — Progress Notes (Signed)
Cardiology Office Note:    Date:  12/12/2021   ID:  Zachary Warner, DOB 04/04/1955, MRN [id number]  PCP:  Zachary Olp, MD  Cardiologist:  Zachary Campus, MD    Referring MD: Zachary Olp, MD   Chief Complaint  Patient presents with   Follow-up    History of Present Illness:    Zachary Warner is a 66 y.o. male with past medical history significant for coronary artery disease in 2016 he suffered from myocardial infarction that required PTCA and stenting of the obtuse marginal branch at that time he was found to have mild to moderate distal LAD.  Additional problems include essential hypertension, longstanding diabetes, dyslipidemia. Comes today 2 months of follow-up overall he said he is doing fine but 3 weeks ago he did have some pain in the right upper quadrant of his abdomen.  That lasted for few hours.  He called his physician and advised him to go to the emergency room and did not he was also nauseated with that sensation.  Then that sensation went away entire duration was few hours since that time 0 problem he can walk climb stairs walking no difficulties.  EKG today however shows some Q waves that was not present before inferiorly.  Past Medical History:  Diagnosis Date   Allergy    Diabetes mellitus    Hyperlipidemia    Hypertension    Myocardial infarction [facility])    May 2016   Nephrolithiasis    3x 2007-2015, passed on his own   Sleep apnea    wears CPAP    Past Surgical History:  Procedure Laterality Date   CARDIAC CATHETERIZATION     stent May 2016   COLONOSCOPY     heart stent     May 2016   HERNIA REPAIR      Current Medications: Current Meds  Medication Sig   aspirin 81 MG tablet Take 81 mg by mouth daily.   atorvastatin (LIPITOR) 40 MG tablet Take 1 tablet (40 mg total) by mouth daily.   carvedilol (COREG) 3.125 MG tablet Take 1 tablet (3.125 mg total) by mouth 2 times daily. (Patient taking differently: Take 3.125 mg by mouth 2 (two) times  daily with a meal.)   EQ FIBER SUPPLEMENT PO Take 1 tablet by mouth daily. Unknown strength   fluticasone (FLONASE) 50 MCG/ACT nasal spray Place 1 spray into both nostrils daily as needed for allergies or rhinitis.   insulin lispro (HUMALOG KWIKPEN) 100 UNIT/ML KwikPen INJECT 28-48 UNITS SUBCUTANEOUSLY THREE TIMES DAILY (Patient taking differently: Inject 28-48 Units into the skin 3 (three) times daily. INJECT 28-48 UNITS SUBCUTANEOUSLY THREE TIMES DAILY)   JARDIANCE 25 MG TABS tablet TAKE ONE TABLET (25mg ) BY MOUTH DAILY (Patient taking differently: Take 25 mg by mouth daily.)   lisinopril (ZESTRIL) 40 MG tablet Take 1 tablet (40 mg total) by mouth daily.   metFORMIN (GLUCOPHAGE) 1000 MG tablet Take 1 tablet (1,000 mg total) by mouth 2 (two) times daily with a meal.   nitroGLYCERIN (NITROSTAT) 0.4 MG SL tablet PLACE 1 TABLET UNDER TONGUE EVERY 5 MINUTES AS NEEDED FOR CHEST PAIN. CALL 911/DOCTOR IF YOU TAKE 2 TABLETS. max of 3 tablets/day. (Patient taking differently: Place 0.4 mg under the tongue every 5 (five) minutes as needed for chest pain.)   Omega-3 Fatty Acids (FISH OIL) 1000 MG CAPS Take 4 capsules by mouth daily.   OZEMPIC, 1 MG/DOSE, 4 MG/3ML SOPN Inject 1 mg into the skin once a  week.   TOUJEO MAX SOLOSTAR 300 UNIT/ML Solostar Pen Inject 80 Units into the skin daily.   vitamin B-12 (CYANOCOBALAMIN) 500 MCG tablet Take 500 mcg by mouth daily.   [DISCONTINUED] insulin aspart (NOVOLOG FLEXPEN) 100 UNIT/ML FlexPen Inject 10-20 Units into the skin 3 (three) times daily with meals.     Allergies:   Invokana [canagliflozin]   Social History   Socioeconomic History   Marital status: Married    Spouse name: Not on file   Number of children: Not on file   Years of education: Not on file   Highest education level: Not on file  Occupational History   Not on file  Tobacco Use   Smoking status: Never   Smokeless tobacco: Never  Substance and Sexual Activity   Alcohol use: No   Drug use:  No   Sexual activity: Never  Other Topics Concern   Not on file  Social History Narrative   Married. 2 children son and daughter. Granddaughter 12/02/2014 born and grandson 7/17//2018.       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      Hobbies: golfing (low to mid 22s)   Social Determinants of Radio broadcast assistant Strain: Low Risk  (08/08/2021)   Overall Financial Resource Strain (CARDIA)    Difficulty of Paying Living Expenses: Not hard at all  Food Insecurity: No Food Insecurity (08/08/2021)   Hunger Vital Sign    Worried About Running Out of Food in the Last Year: Never true    Ran Out of Food in the Last Year: Never true  Transportation Needs: No Transportation Needs (08/08/2021)   PRAPARE - Hydrologist (Medical): No    Lack of Transportation (Non-Medical): No  Physical Activity: Sufficiently Active (08/08/2021)   Exercise Vital Sign    Days of Exercise per Week: 3 days    Minutes of Exercise per Session: 120 min  Stress: No Stress Concern Present (08/08/2021)   [place]    Feeling of Stress : Not at all  Social Connections: Moderately Integrated (08/08/2021)   Social Connection and Isolation Panel [NHANES]    Frequency of Communication with Friends and Family: More than three times a week    Frequency of Social Gatherings with Friends and Family: More than three times a week    Attends Religious Services: More than 4 times per year    Active Member of [employer] or Organizations: No    Attends Music therapist: Never    Marital Status: Married     Family History: The patient's family history includes Coronary artery disease in his father; Diabetes in his sister and sister; Kidney disease in his sister; Liver disease in his father; Other in his sister. There is no history of Colon cancer, Esophageal cancer, Rectal cancer, or  Stomach cancer. ROS:   Please see the history of present illness.    All 14 point review of systems negative except as described per history of present illness  EKGs/Labs/Other Studies Reviewed:      Recent Labs: 10/06/2021: ALT 18; BUN 17; Creatinine, Ser 1.13; Hemoglobin 14.2; Platelets 186.0; Potassium 4.2; Sodium 139  Recent Lipid Panel    Component Value Date/Time   CHOL 114 12/03/2020 0922   TRIG 152 (H) 12/03/2020 0922   TRIG 176 (H) 01/29/2006 1110   HDL 29 (L) 12/03/2020 0922   CHOLHDL  3.9 12/03/2020 0922   CHOLHDL 3.1 03/03/2020 0952   VLDL 49.0 (H) 02/24/2019 1110   LDLCALC 59 12/03/2020 0922   LDLCALC 51 03/03/2020 0952   LDLDIRECT 82.0 04/04/2021 1015    Physical Exam:    VS:  BP 120/70 (BP Location: Left Arm, Patient Position: Sitting)   Pulse 97   Ht 5\' 10"  (1.778 m)   Wt 237 lb 6.4 oz (107.7 kg)   SpO2 97%   BMI 34.06 kg/m     Wt Readings from Last 3 Encounters:  12/12/21 237 lb 6.4 oz (107.7 kg)  12/05/21 239 lb 3.2 oz (108.5 kg)  10/03/21 237 lb (107.5 kg)     GEN:  Well nourished, well developed in no acute distress HEENT: Normal NECK: No JVD; No carotid bruits LYMPHATICS: No lymphadenopathy CARDIAC: RRR, no murmurs, no rubs, no gallops RESPIRATORY:  Clear to auscultation without rales, wheezing or rhonchi  ABDOMEN: Soft, non-tender, non-distended MUSCULOSKELETAL:  No edema; No deformity  SKIN: Warm and dry LOWER EXTREMITIES: no swelling NEUROLOGIC:  Alert and oriented x 3 PSYCHIATRIC:  Normal affect   ASSESSMENT:    1. Coronary artery disease involving native coronary artery of native heart without angina pectoris   2. Essential hypertension   3. Type 2 diabetes mellitus with other circulatory complication, without long-term current use of insulin ([place])   4. Mixed hyperlipidemia    PLAN:    In order of problems listed above:  Coronary artery disease status post PTCA and stenting in 2016 of obtuse marginal branch.  His EKG does show  some new changes.  Previously Q waves only in 1 inferior leads today into inferior leads.  I will ask him to have an echocardiogram done to assess left ventricle ejection fraction and particularly look at his inferior wall.  He is on antiplatelet therapy and statin completely asymptomatic right now since symptomatology of 3 weeks ago which was very atypical Essential hypertension, blood pressure well controlled continue present management. Dyslipidemia I did review his K PN which show his LDL of 59 HDL is 29 he is on high intense statin form of Lipitor 40 which I will continue Type 2 diabetes followed by endocrinology hemoglobin A1c 7 from 08/02/2021.  Ideally need to be better controlled.   Medication Adjustments/Labs and Tests Ordered: Current medicines are reviewed at length with the patient today.  Concerns regarding medicines are outlined above.  No orders of the defined types were placed in this encounter.  Medication changes: No orders of the defined types were placed in this encounter.   Signed, Park Liter, MD, [facility] 12/12/2021 1:20 PM    [place]

## 2021-12-12 NOTE — Addendum Note (Signed)
Addended by: Jacobo Forest D on: 12/12/2021 01:28 PM   Modules accepted: Orders

## 2021-12-12 NOTE — Patient Instructions (Addendum)

## 2021-12-14 ENCOUNTER — Ambulatory Visit: Payer: HMO | Admitting: Internal Medicine

## 2021-12-14 ENCOUNTER — Encounter: Payer: Self-pay | Admitting: Internal Medicine

## 2021-12-14 VITALS — BP 120/76 | HR 86 | Ht 70.0 in | Wt 238.2 lb

## 2021-12-14 DIAGNOSIS — E669 Obesity, unspecified: Secondary | ICD-10-CM | POA: Diagnosis not present

## 2021-12-14 DIAGNOSIS — E785 Hyperlipidemia, unspecified: Secondary | ICD-10-CM

## 2021-12-14 DIAGNOSIS — Z794 Long term (current) use of insulin: Secondary | ICD-10-CM | POA: Diagnosis not present

## 2021-12-14 DIAGNOSIS — E1159 Type 2 diabetes mellitus with other circulatory complications: Secondary | ICD-10-CM

## 2021-12-14 DIAGNOSIS — E66811 Obesity, class 1: Secondary | ICD-10-CM

## 2021-12-14 DIAGNOSIS — E042 Nontoxic multinodular goiter: Secondary | ICD-10-CM

## 2021-12-14 LAB — LIPID PANEL
Cholesterol: 128 mg/dL (ref 0–200)
HDL: 33.4 mg/dL — ABNORMAL LOW (ref >39.00)
LDL Cholesterol: 56 mg/dL (ref 0–99)
NonHDL: 94.91
Total CHOL/HDL Ratio: 4
Triglycerides: 194 mg/dL — ABNORMAL HIGH (ref 0.0–149.0)
VLDL: 38.8 mg/dL (ref 0.0–40.0)

## 2021-12-14 LAB — POCT GLYCOSYLATED HEMOGLOBIN (HGB A1C): Hemoglobin A1C: 6.8 % — AB (ref 4.0–5.6)

## 2021-12-14 LAB — TSH: TSH: 1.61 u[IU]/mL (ref 0.35–5.50)

## 2021-12-14 NOTE — Patient Instructions (Signed)
Please continue: - Metformin 1000 mg 2x a day. - Jardiance 25 mg daily before breakfast  - Toujeo 80 units daily - Humalog 25-30-40 units before meals - Ozempic 1 mg weekly  Please return in 4 months with your sugar log.

## 2021-12-14 NOTE — Progress Notes (Unsigned)
Patient ID: Zachary Warner, male   DOB: December 31, 1955, 66 y.o.   MRN: [id number]  HPI: Zachary Warner is a 66 y.o.-year-old male, presenting for f/u for DM2, dx in ~1995, insulin-dependent since ~2005, uncontrolled, with complications (CAD - s/p AMI, DR). Last visit 4 months ago.  Interim history: No increased urination, blurry vision, nausea, chest pain.   He is retired and works only 3 days a week (19 hours: 10 am to 4 pm).  He loves it.  DM2:  Reviewed HbA1c levels: Lab Results  Component Value Date   HGBA1C 7.0 (A) 08/02/2021   HGBA1C 7.4 (H) 04/04/2021   HGBA1C 7.5 (A) 11/22/2020  07/22/2020: HbA1c calculated from fructosamine is 6.16%, excellent. 03/31/2020: HbA1c calculated from fructosamine is at goal, at 6.1%. 11/25/2019: HbA1c calculated from fructosamine is 6.2% 01/10/2019: HbA1c calculated from fructosamine 6.35% 04/09/2017: HbA1c calculated from fructosamine is 6.8% 11/28/2017:  Hba1c calculated from fructosamine is better: 6.45% 07/11/2017: HbA1c calculated from the fructosamine is 6.6%. 03/08/2017: HbA1c calculated from the fructosamine is 6.5%. 11/06/2016: HbA1c calculated from the fructosamine is slightly higher, at 6.6% 07/05/2016: HbA1c calculated from the fructosamine is 6.2% 04/05/2016: HbA1c calculated from fructosamine is excellent, at 6% 01/04/2016: HbA1c calculated from fructosamine is  6.55%. 11/10/2015: HbA1c calculated from fructosamine is 5.8%. 08/10/2015: HbA1c calculated from fructosamine is 7.05%. 04/09/2015: HbA1c calculated fructosamine (326): 7.1%, which is much more concordant with his sugar log.   He is on: - Metformin 1000 mg 2x a day. - Jardiance 25 mg daily before breakfast -he initially had yeast infections, now resolved - Ozempic 0.5 mg weekly-added 07/2019 >> 1 mg weekly (started 10/2020) - Tresiba U200  >> Toujeo 80 units daily - Humalog: 25-30-40 units before meals We stopped Januvia 2/2 large doses of mealtime insulin.  He checks his sugars  1x a day: - am: 79-128 >> 93-135, 155, 166 >> 89-149, 162 >> 105-120 - 2h after b'fast: 189-251 >> 244 x1 >> n/c >> 148 >> n/c - lunch:  126-160 >> n/c >> 130-177 >> 138, 147 >> n/c - 2h after lunch:  137-190 >> 158, 160 >> 77-113, 197 >> n/c - dinner: 80, 141-158 >> 149-164 >> 147 >> 125 >> n/c - 2h after dinner: 148-189, 300 >> 200 >> 160, 163 >> n/c - bedtime: 168-189 >> n/c >> 169, 194 >> see below - nighttime: 200 >> n/c >> 81 >> 90 >> 73 >> 73, 109 >> n/c Low sugar: 50 - at night ... >> 73 (night) >> 89 >> 105; has hypoglycemia awareness in the 60s. Highest sugar was 368 ... >> 162 >> 184.  He has a ReliOn meter.  Pt's meals are: - Breakfast: grits or sandwich - Lunch: sandwich - Dinner: chicken or beef or pork + vegetables - Snacks: 1 or 2 snacks  - crackers, peanuts or cookies  No CKD, last BUN/creatinine:  Lab Results  Component Value Date   BUN 17 10/06/2021   CREATININE 1.13 10/06/2021  On lisinopril.  + HL; last set of lipids: Lab Results  Component Value Date   CHOL 114 12/03/2020   HDL 29 (L) 12/03/2020   LDLCALC 59 12/03/2020   LDLDIRECT 82.0 04/04/2021   TRIG 152 (H) 12/03/2020   CHOLHDL 3.9 12/03/2020  On Lipitor 40 mg every day and fish oil  1000 mg 1x a day.  - Latest eye exam: 08/2020: No DR, prev.+ DR. He was seeing Dr Ambrose Mantle [facility] - retired.  Now Dr. Drucilla Schmidt.  - no  numbness and tingling in his feet. Last foot exam: 03/2021.  He also has a history of HTN, h/o nephrolithiasis x 3, last in 03/2013, OSA.   He had an MI 07/2014 >> had a stent placed. Cardiologist: Dr Sedonia Small. He is on supplementation with B12. He had heel pain, improved after starting to use special socks and using a TENS unit-Plantar fasciitis.  Thyroid nodules:  Thyroid U/S (01/22/2019): Several nodules, of which 2 were dominant:  Narrative & Impression    Parenchymal Echotexture: Moderately heterogenous Isthmus: 1.0 cm Right lobe: 5.4 x 2.0 x 1.8 cm Left  lobe: 4.7 x 2.4 x 2.0 cm _____________________________________   Estimated total number of nodules >/= 1 cm: 5 _____________________________________   Nodule # 1: Location: Isthmus; Mid Maximum size: 1.9 cm; Other 2 dimensions: 1.3 x 1.7 cm Composition: cannot determine (2) Echogenicity: hypoechoic (2) Echogenic foci: macrocalcifications (1) ACR TI-RADS total points: 5.  **Given size (>/= 1.5 cm) and appearance, fine needle aspiration of this moderately suspicious nodule should be considered based on TI-RADS criteria. _________________________________________________________   Nodule # 2: Location: Right; Superior Maximum size: 1.0 cm; Other 2 dimensions: 0.6 x 0.7 cm Composition: solid/almost completely solid (2) Echogenicity: hypoechoic (2) Echogenic foci: punctate echogenic foci (3) ACR TI-RADS total points: 7.  **Given size (>/= 1.0 cm) and appearance, fine needle aspiration of this highly suspicious nodule should be considered based on TI-RADS criteria. _________________________________________________________   Nodule #3 is a predominantly cystic nodule the mid right thyroid lobe that measures 1.1 x 0.8 x 0.8 cm.   Nodule #4 is a cystic nodule in the superior left thyroid lobe that measures 1.5 x 0.9 x 1.0 cm.   Nodule #5 is a mildly complex cystic nodule with septations in the mid left thyroid lobe that measures 1.2 x 1.0 x 1.3 cm.   Nodule # 6: Location: Left; Mid Maximum size: 0.8 cm; Other 2 dimensions: 0.6 x 0.5 cm Composition: mixed cystic and solid (1) Echogenicity: isoechoic (1) Shape: taller-than-wide (3) ACR TI-RADS total points: 5. Given size (<0.9 cm) and appearance, this nodule does NOT meet TI-RADS criteria for biopsy or dedicated follow-up. _______________________________________   IMPRESSION: 1. Multinodular goiter. 2. Nodule #1 in the isthmus and nodule #2 in the superior right thyroid lobe both meet criteria for ultrasound-guided  biopsy. 3. Multiple cystic nodules that do not meet criteria for biopsy.   The above is in keeping with the ACR TI-RADS recommendations - J Am Coll Radiol 2017;14:587-595.   Electronically Signed   By: Richarda Overlie M.D.   On: 01/22/2019 17:11    Biopsies of the dominant nodules (02/13/2019): Benign:  Clinical History: Nodule 1 Isthmus Mid 1.9 cm; Other 2 dimensions: 1.3 x  1.7 cm, Hypoechoic, ACR TI-RADS total points: 5, Moderately suspicious  nodule  Specimen Submitted:  A. THYROID ISTHMUS, FINE NEEDLE ASPIRATION:   FINAL MICROSCOPIC DIAGNOSIS:  - Consistent with benign follicular nodule (Bethesda category II)   SPECIMEN ADEQUACY:  Satisfactory for evaluation   CYTOLOGY - NON PAP  CASE: [id number]  PATIENT: Starling Lapier  Non-Gynecological Cytology Report   Clinical History: Nodule 2 Right Superior 1.0 cm; Other 2 dimensions:  0.6 x 0.7 cm, Solid almost completely solid, Hypoechoic, ACR TI-RADS  total points: 7, Highly suspicious  nodule  Specimen Submitted:  A. THYROID, RT LOBE RUP, FINE NEEDLE ASPIRATION:   FINAL MICROSCOPIC DIAGNOSIS:  - Consistent with benign follicular nodule (Bethesda category II)   SPECIMEN ADEQUACY:  Satisfactory for evaluation  Thyroid ultrasound (04/20/2020):  Parenchymal Echotexture: Moderately heterogeneous Isthmus: 0.8 cm Right lobe: 4.4 x 2.0 x 1.6 cm Left lobe: 4.8 x 2.0 x 1.8 cm _________________________________________________________  Nodule 1: 1.1 x 1.1 x 0.7 cm nodule located in the isthmus has decreased in size since prior examination where it measured 1.9 x 1.7 x 1.3 cm. FNA of this nodule was performed on 02/12/2019. Please correlate with results.  Nodule 2: 1.0 x 0.9 x 0.6 cm nodule located in the superior right thyroid lobe is not significantly changed in size measuring 1.0 x 0.7 x 0.6 cm on the prior exam. FNA of this nodule was performed on 02/12/2019. Please correlate with results.  Nodule 3: 1.2 x 0.9 x 0.7 cm  cystic nodule located in the mid right thyroid lobe is not significantly changed in size since prior exam where it measured 1.1 x 0.8 x 0.8 cm. It does not meet criteria for FNA or imaging follow-up.  Nodule 4: 0.6 x 0.6 x 0.3 cm solid hypoechoic nodule in the inferior right thyroid lobe is new since the prior examination. It does not meet criteria for FNA or imaging follow-up.  Nodule 5: 1.6 x 0.9 x 0.8 cm cystic nodule in the superior left thyroid lobe is not significantly changed in size since prior examination when where measured 1.5 x 1.0 x 0.9 cm. This does not meet criteria for FNA or imaging follow-up.  Nodule 6: 0.8 x 0.7 x 0.6 cm solid isoechoic nodule in the mid left thyroid lobe previously measured 1.2 x 1.0 x 1.3 cm. Interval decrease in size favors a benign etiology. This nodule does not meet criteria for imaging follow-up or FNA. _________________________________________________________  IMPRESSION: Bilateral thyroid nodules. Nodules 1 and 2 were previously biopsied. Please correlate with results. Remaining nodules do not meet criteria for FNA or imaging follow-up.  Pt denies: - feeling nodules in neck - hoarseness - dysphagia - choking  Latest TSH was normal: Lab Results  Component Value Date   TSH 1.14 03/31/2020   ROS: + See HPI  I reviewed pt's medications, allergies, PMH, social hx, family hx, and changes were documented in the history of present illness. Otherwise, unchanged from my initial visit note.  Past Medical History:  Diagnosis Date   Allergy    Diabetes mellitus    Hyperlipidemia    Hypertension    Myocardial infarction [facility])    May 2016   Nephrolithiasis    3x 2007-2015, passed on his own   Sleep apnea    wears CPAP   Past Surgical History:  Procedure Laterality Date   CARDIAC CATHETERIZATION     stent May 2016   COLONOSCOPY     heart stent     May 2016   HERNIA REPAIR     Social History   Socioeconomic History   Marital  status: Married    Spouse name: Not on file   Number of children: Not on file   Years of education: Not on file   Highest education level: Not on file  Occupational History   Not on file  Tobacco Use   Smoking status: Never   Smokeless tobacco: Never  Substance and Sexual Activity   Alcohol use: No   Drug use: No   Sexual activity: Never  Other Topics Concern   Not on file  Social History Narrative   Married. 2 children son and daughter. Granddaughter 12/02/2014 born and grandson 7/17//2018.       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      Hobbies: golfing (low to mid 16s)   Social Determinants of Health   Financial Resource Strain: Low Risk  (08/08/2021)   Overall Financial Resource Strain (CARDIA)    Difficulty of Paying Living Expenses: Not hard at all  Food Insecurity: No Food Insecurity (08/08/2021)   Hunger Vital Sign    Worried About Running Out of Food in the Last Year: Never true    Ran Out of Food in the Last Year: Never true  Transportation Needs: No Transportation Needs (08/08/2021)   PRAPARE - Administrator, Civil Service (Medical): No    Lack of Transportation (Non-Medical): No  Physical Activity: Sufficiently Active (08/08/2021)   Exercise Vital Sign    Days of Exercise per Week: 3 days    Minutes of Exercise per Session: 120 min  Stress: No Stress Concern Present (08/08/2021)   [employer] of Occupational Health - Occupational Stress Questionnaire    Feeling of Stress : Not at all  Social Connections: Moderately Integrated (08/08/2021)   Social Connection and Isolation Panel [NHANES]    Frequency of Communication with Friends and Family: More than three times a week    Frequency of Social Gatherings with Friends and Family: More than three times a week    Attends Religious Services: More than 4 times per year    Active Member of [employer] or Organizations: No    Attends Banker Meetings:  Never    Marital Status: Married  Catering manager Violence: Not At Risk (08/08/2021)   Humiliation, Afraid, Rape, and Kick questionnaire    Fear of Current or Ex-Partner: No    Emotionally Abused: No    Physically Abused: No    Sexually Abused: No   Current Outpatient Medications on File Prior to Visit  Medication Sig Dispense Refill   aspirin 81 MG tablet Take 81 mg by mouth daily.     atorvastatin (LIPITOR) 40 MG tablet Take 1 tablet (40 mg total) by mouth daily. 90 tablet 3   carvedilol (COREG) 3.125 MG tablet Take 1 tablet (3.125 mg total) by mouth 2 times daily. (Patient taking differently: Take 3.125 mg by mouth 2 (two) times daily with a meal.) 60 tablet 3   EQ FIBER SUPPLEMENT PO Take 1 tablet by mouth daily. Unknown strength     fluticasone (FLONASE) 50 MCG/ACT nasal spray Place 1 spray into both nostrils daily as needed for allergies or rhinitis.     insulin lispro (HUMALOG KWIKPEN) 100 UNIT/ML KwikPen INJECT 28-48 UNITS SUBCUTANEOUSLY THREE TIMES DAILY (Patient taking differently: Inject 28-48 Units into the skin 3 (three) times daily. INJECT 28-48 UNITS SUBCUTANEOUSLY THREE TIMES DAILY) 45 mL 3   JARDIANCE 25 MG TABS tablet TAKE ONE TABLET (25mg ) BY MOUTH DAILY (Patient taking differently: Take 25 mg by mouth daily.) 90 tablet 5   lisinopril (ZESTRIL) 40 MG tablet Take 1 tablet (40 mg total) by mouth daily. 30 tablet 3   metFORMIN (GLUCOPHAGE) 1000 MG tablet Take 1 tablet (1,000 mg total) by mouth 2 (two) times daily with a meal. 180 tablet 3   nitroGLYCERIN (NITROSTAT) 0.4 MG SL tablet PLACE 1 TABLET UNDER TONGUE EVERY 5 MINUTES AS NEEDED FOR CHEST PAIN. CALL 911/DOCTOR IF YOU TAKE 2 TABLETS. max of 3 tablets/day. (Patient taking differently: Place 0.4 mg under the tongue every 5 (five) minutes as needed for chest pain.) 25 tablet 3   Omega-3 Fatty Acids (FISH OIL) 1000 MG  CAPS Take 4 capsules by mouth daily.     OZEMPIC, 1 MG/DOSE, 4 MG/3ML SOPN Inject 1 mg into the skin once a  week. 9 mL 3   TOUJEO MAX SOLOSTAR 300 UNIT/ML Solostar Pen Inject 80 Units into the skin daily. 24 mL 3   vitamin B-12 (CYANOCOBALAMIN) 500 MCG tablet Take 500 mcg by mouth daily.     No current facility-administered medications on file prior to visit.   Allergies  Allergen Reactions   Invokana [Canagliflozin] Other (See Comments)    Yeast infection   Family History  Problem Relation Age of Onset   Coronary artery disease Father    Liver disease Father        from heart meds per pt   Diabetes Sister        grandmother   Kidney disease Sister        dialysis from dm   Diabetes Sister    Other Sister        flash pulmonary edema led to death   Colon cancer Neg Hx    Esophageal cancer Neg Hx    Rectal cancer Neg Hx    Stomach cancer Neg Hx     PE: BP 120/76 (BP Location: Right Arm, Patient Position: Sitting, Cuff Size: Normal)   Pulse 86   Ht  (1.778 m)   Wt 238 lb 3.2 oz (108 kg)   SpO2 98%   BMI 34.18 kg/m   Wt Readings from Last 3 Encounters:  12/14/21 238 lb 3.2 oz (108 kg)  12/12/21 237 lb 6.4 oz (107.7 kg)  12/05/21 239 lb 3.2 oz (108.5 kg)   Constitutional: overweight, in NAD Eyes: EOMI, no exophthalmos ENT:no thyromegaly but left thyroid nodule felt on palpation, no cervical lymphadenopathy Cardiovascular: RRR, No MRG Respiratory: CTA B Musculoskeletal: no deformities Skin: no rashes Neurological: no tremor with outstretched hands  ASSESSMENT: 1. DM2, insulin-dependent, controlled, with complications - CAD, s/p AMi - DR  He does not have a history of pancreatitis or family history of medullary thyroid cancer.    2. HL  3.  Obesity class I  4.  Thyroid nodules  PLAN:  1. Patient with standing, fairly well-controlled type 2 diabetes, on a complex medication regimen with metformin, SGLT2 inhibitor, basal-bolus insulin and weekly GLP-1 receptor agonist, with improved HbA1c at last visit.  At that time, this was 7.0%, lower, so we did not check  a fructosamine level, which is usually more accurate for him (positive glycation gap).  At last visit, sugars were at goal with only occasional hyperglycemic spikes in the morning after dietary indiscretions the night before.  We did not change his regimen. -At today's visit, blood sugars appear to be at goal in the morning, but he is not checking later in the day.  I did advise him to start doing so, as otherwise it is impossible to adjust his regimen.  For now, especially in the setting of a lower HbA1c, at goal (see below), we can continue the regimen. - I suggested to:  Patient Instructions  Please continue: - Metformin 1000 mg 2x a day. - Jardiance 25 mg daily before breakfast  - Toujeo 80 units daily - Humalog 25-30-40 units before meals - Ozempic 1 mg weekly  Please return in 4 months with your sugar log.  - we checked his HbA1c: 6.8% (lower) - advised to check sugars at different times of the day - 3-4x a day, rotating check times - advised for yearly  eye exams >> he is due - return to clinic in 4 months  2. HL -Reviewed his latest lipid panel from 11/2020: LDL at goal, HDL slightly low: Lab Results  Component Value Date   CHOL 114 12/03/2020   HDL 29 (L) 12/03/2020   LDLCALC 59 12/03/2020   LDLDIRECT 82.0 04/04/2021   TRIG 152 (H) 12/03/2020   CHOLHDL 3.9 12/03/2020  -He continues on Lipitor 40 mg daily and fish oil 1000 mg once a day-tolerated well -He is due for another lipid panel - will check today, fasting  3.  Obesity class I -continue SGLT 2 inhibitor and GLP-1 receptor agonist which should also help with weight loss -His weight was stable at last visit -Weight remains stable now  4.  Thyroid nodules -Initially felt on palpation -He denies neck compression symptoms -TSH was normal: Lab Results  Component Value Date   TSH 1.14 03/31/2020  -Reviewed latest thyroid ultrasound report from 03/2020: He has 2 dominant nodules which were biopsied with benign  results.  The rest of the nodules were small and not worrisome.  No follow-up was needed for these. -We discussed about repeating an ultrasound next year -We will repeat a TSH today  Zachary Pavlovristina Banesa Tristan, MD PhD [facility]

## 2021-12-17 ENCOUNTER — Other Ambulatory Visit: Payer: Self-pay | Admitting: Family Medicine

## 2021-12-26 ENCOUNTER — Ambulatory Visit: Payer: HMO | Attending: Cardiology

## 2021-12-26 DIAGNOSIS — R9431 Abnormal electrocardiogram [ECG] [EKG]: Secondary | ICD-10-CM | POA: Diagnosis not present

## 2021-12-26 LAB — ECHOCARDIOGRAM COMPLETE
Area-P 1/2: 4.71 cm2
S' Lateral: 3.1 cm

## 2022-03-13 ENCOUNTER — Other Ambulatory Visit: Payer: Self-pay | Admitting: Internal Medicine

## 2022-03-15 ENCOUNTER — Other Ambulatory Visit: Payer: Self-pay | Admitting: Internal Medicine

## 2022-03-15 ENCOUNTER — Other Ambulatory Visit: Payer: Self-pay | Admitting: Family Medicine

## 2022-03-15 DIAGNOSIS — Z794 Long term (current) use of insulin: Secondary | ICD-10-CM

## 2022-03-17 ENCOUNTER — Other Ambulatory Visit: Payer: Self-pay | Admitting: Internal Medicine

## 2022-03-17 DIAGNOSIS — Z794 Long term (current) use of insulin: Secondary | ICD-10-CM

## 2022-03-30 ENCOUNTER — Other Ambulatory Visit: Payer: Self-pay | Admitting: Family Medicine

## 2022-04-07 ENCOUNTER — Ambulatory Visit (INDEPENDENT_AMBULATORY_CARE_PROVIDER_SITE_OTHER): Payer: HMO | Admitting: Family Medicine

## 2022-04-07 ENCOUNTER — Encounter: Payer: Self-pay | Admitting: Family Medicine

## 2022-04-07 VITALS — BP 115/56 | HR 90 | Temp 97.9°F | Ht 70.0 in | Wt 232.6 lb

## 2022-04-07 DIAGNOSIS — R351 Nocturia: Secondary | ICD-10-CM

## 2022-04-07 DIAGNOSIS — Z0000 Encounter for general adult medical examination without abnormal findings: Secondary | ICD-10-CM

## 2022-04-07 DIAGNOSIS — E1159 Type 2 diabetes mellitus with other circulatory complications: Secondary | ICD-10-CM

## 2022-04-07 DIAGNOSIS — I10 Essential (primary) hypertension: Secondary | ICD-10-CM

## 2022-04-07 LAB — CBC WITH DIFFERENTIAL/PLATELET
Basophils Absolute: 0 10*3/uL (ref 0.0–0.1)
Basophils Relative: 0.4 % (ref 0.0–3.0)
Eosinophils Absolute: 0.3 10*3/uL (ref 0.0–0.7)
Eosinophils Relative: 3.4 % (ref 0.0–5.0)
HCT: 43.4 % (ref 39.0–52.0)
Hemoglobin: 14.5 g/dL (ref 13.0–17.0)
Lymphocytes Relative: 29.3 % (ref 12.0–46.0)
Lymphs Abs: 2.3 10*3/uL (ref 0.7–4.0)
MCHC: 33.3 g/dL (ref 30.0–36.0)
MCV: 86 fl (ref 78.0–100.0)
Monocytes Absolute: 0.5 10*3/uL (ref 0.1–1.0)
Monocytes Relative: 6.5 % (ref 3.0–12.0)
Neutro Abs: 4.8 10*3/uL (ref 1.4–7.7)
Neutrophils Relative %: 60.4 % (ref 43.0–77.0)
Platelets: 210 10*3/uL (ref 150.0–400.0)
RBC: 5.05 Mil/uL (ref 4.22–5.81)
RDW: 14.1 % (ref 11.5–15.5)
WBC: 7.9 10*3/uL (ref 4.0–10.5)

## 2022-04-07 LAB — COMPREHENSIVE METABOLIC PANEL
ALT: 15 U/L (ref 0–53)
AST: 13 U/L (ref 0–37)
Albumin: 4.3 g/dL (ref 3.5–5.2)
Alkaline Phosphatase: 63 U/L (ref 39–117)
BUN: 19 mg/dL (ref 6–23)
CO2: 24 mEq/L (ref 19–32)
Calcium: 9.4 mg/dL (ref 8.4–10.5)
Chloride: 104 mEq/L (ref 96–112)
Creatinine, Ser: 0.97 mg/dL (ref 0.40–1.50)
GFR: 81.26 mL/min (ref >60.00)
Glucose, Bld: 121 mg/dL — ABNORMAL HIGH (ref 70–99)
Potassium: 4.3 mEq/L (ref 3.5–5.1)
Sodium: 140 mEq/L (ref 135–145)
Total Bilirubin: 0.6 mg/dL (ref 0.2–1.2)
Total Protein: 7 g/dL (ref 6.0–8.3)

## 2022-04-07 LAB — MICROALBUMIN / CREATININE URINE RATIO
Creatinine,U: 72 mg/dL
Microalb Creat Ratio: 1 mg/g (ref 0.0–30.0)
Microalb, Ur: <0.7 mg/dL (ref 0.0–1.9)

## 2022-04-07 LAB — PSA: PSA: 0.94 ng/mL (ref 0.10–4.00)

## 2022-04-07 LAB — HEMOGLOBIN A1C: Hgb A1c MFr Bld: 7.2 % — ABNORMAL HIGH (ref 4.6–6.5)

## 2022-04-07 NOTE — Progress Notes (Signed)
Phone: [phone]   Subjective:  Patient presents today for their annual physical. Chief complaint-noted.   See problem oriented charting- ROS- full  review of systems was completed and negative  except for: congestion, cough with allergies- wife with covid but he has tested negative- worse 2 weeks ago and doing better  The following were reviewed and entered/updated in epic: Past Medical History:  Diagnosis Date   Allergy    Diabetes mellitus    Hyperlipidemia    Hypertension    Myocardial infarction [facility])    May 2016   Nephrolithiasis    3x 2007-2015, passed on his own   Sleep apnea    wears CPAP   Patient Active Problem List   Diagnosis Date Noted   Diabetic retinopathy ([place]) 02/12/2017    Priority: High   CAD (coronary artery disease) status post PTCA and stent to obtuse marginal branch in 2016 08/18/2014    Priority: High   DM (diabetes mellitus) ([place]) 02/04/2006    Priority: High   Multiple thyroid nodules 03/31/2020    Priority: Medium    OSA on CPAP 08/26/2019    Priority: Medium    GERD (gastroesophageal reflux disease) 08/19/2014    Priority: Medium    Hyperlipidemia associated with type 2 diabetes mellitus ([place]) 02/04/2006    Priority: Medium    Essential hypertension 02/04/2006    Priority: Medium    Allergy 12/02/2020    Priority: Low   Old MI (myocardial infarction) 01/29/2017    Priority: Low   Nephrolithiasis     Priority: Low   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    Priority: Low   RECTAL FISSURE 06/05/2006    Priority: Low   Past Surgical History:  Procedure Laterality Date   CARDIAC CATHETERIZATION     stent May 2016   COLONOSCOPY     heart stent     May 2016   HERNIA REPAIR      Family History  Problem Relation Age of Onset   Coronary artery disease Father    Liver disease Father        from heart meds per pt   Diabetes Sister        grandmother   Kidney disease Sister        dialysis from dm   Diabetes Sister    Other Sister         flash pulmonary edema led to death   Colon cancer Neg Hx    Esophageal cancer Neg Hx    Rectal cancer Neg Hx    Stomach cancer Neg Hx     Medications- reviewed and updated Current Outpatient Medications  Medication Sig Dispense Refill   aspirin 81 MG tablet Take 81 mg by mouth daily.     atorvastatin (LIPITOR) 40 MG tablet Take 1 tablet (40 mg total) by mouth daily. 90 tablet 3   carvedilol (COREG) 3.125 MG tablet Take 1 tablet (3.125 mg total) by mouth 2 times daily. 60 tablet 3   EQ FIBER SUPPLEMENT PO Take 1 tablet by mouth daily. Unknown strength     fluticasone (FLONASE) 50 MCG/ACT nasal spray Place 1 spray into both nostrils daily as needed for allergies or rhinitis.     insulin lispro (HUMALOG KWIKPEN) 100 UNIT/ML KwikPen INJECT 28-48 UNITS SUBCUTANEOUSLY THREE TIMES DAILY 45 mL 0   JARDIANCE 25 MG TABS tablet TAKE ONE TABLET (25mg ) BY MOUTH DAILY (Patient taking differently: Take 25 mg by mouth daily.) 90 tablet 5   lisinopril (ZESTRIL)  40 MG tablet Take 1 tablet (40 mg total) by mouth daily. 30 tablet 3   metFORMIN (GLUCOPHAGE) 1000 MG tablet Take 1 tablet (1,000 mg total) by mouth 2 (two) times daily with a meal. 180 tablet 3   nitroGLYCERIN (NITROSTAT) 0.4 MG SL tablet PLACE 1 TABLET UNDER TONGUE EVERY 5 MINUTES AS NEEDED FOR CHEST PAIN. CALL 911/DOCTOR IF YOU TAKE 2 TABLETS. max of 3 tablets/day. (Patient taking differently: Place 0.4 mg under the tongue every 5 (five) minutes as needed for chest pain.) 25 tablet 3   Omega-3 Fatty Acids (FISH OIL) 1000 MG CAPS Take 4 capsules by mouth daily.     OZEMPIC, 1 MG/DOSE, 4 MG/3ML SOPN Inject 1 mg into the skin once a week. 9 mL 3   TOUJEO MAX SOLOSTAR 300 UNIT/ML Solostar Pen Inject 80 Units into the skin daily. 24 mL 3   vitamin B-12 (CYANOCOBALAMIN) 500 MCG tablet Take 500 mcg by mouth daily.     No current facility-administered medications for this visit.    Allergies-reviewed and updated Allergies  Allergen Reactions    Invokana [Canagliflozin] Other (See Comments)    Yeast infection    Social History   Social History Narrative   Married. 2 children son and daughter. Granddaughter 12/02/2014 born and grandson 7/17//2018.       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 Oropharynx normal Neck: no thyromegaly CV: RRR no murmurs rubs or gallops Lungs: CTAB no crackles, wheeze, rhonchi Abdomen: soft/nontender/nondistended/normal bowel sounds. No rebound or guarding. Obesity noted but improved Ext: no edema Skin: warm, dry Neuro: grossly normal, moves all extremities, PERRLA   Diabetic Foot Exam - Simple   Simple Foot Form Diabetic Foot exam was performed with the following findings: Yes 04/07/2022  9:29 AM  Visual Inspection No deformities, no ulcerations, no other skin breakdown bilaterally: Yes Sensation Testing Intact to touch and monofilament testing bilaterally: Yes Pulse Check Posterior Tibialis and Dorsalis pulse intact bilaterally: Yes Comments        Assessment and Plan  67 y.o. male presenting for annual physical.  Health Maintenance counseling: 1. Anticipatory guidance: Patient counseled regarding regular dental exams -q6 months, eye exams -yearly,  avoiding smoking and second hand smoke , limiting alcohol to 2 beverages per day - not at all, no illicit drugs .   2. Risk factor reduction:  Advised patient of need for regular exercise and diet rich and fruits and vegetables to reduce risk of heart attack and stroke.  Exercise- still playing golf 1-2x a week- also doing some walking outside of this- but wants to improve-  goal 150 minutes a week of exercise.  Diet/weight management-weight down another 5 pounds-nice gradual weight loss - continues on improved diet. Long term goal 220 Wt Readings from Last 3 Encounters:  04/07/22 232 lb 9.6 oz (105.5 kg)  12/14/21 238 lb 3.2 oz (108 kg)  12/12/21 237 lb 6.4 oz (107.7 kg)  3. Immunizations/screenings/ancillary studies-fully up to date- team will log covid from november Immunization History  Administered Date(s) Administered   Fluad Quad(high Dose 65+) 02/23/2022   Influenza Split 04/17/2011   Influenza Whole 01/02/2007, 01/07/2009, 02/04/2010, 01/06/2012   Influenza, High Dose Seasonal PF 01/08/2021   Influenza, Quadrivalent, Recombinant, Inj, Pf 01/04/2019   Influenza,inj,Quad PF,6+ Mos 03/14/2013, 02/03/2014, 01/06/2015, 11/28/2017, 03/03/2020   Influenza-Unspecified 12/25/2012, 01/28/2016, 01/13/2017   PFIZER Comirnaty(Gray Top)Covid-19 Tri-Sucrose Vaccine 10/04/2020   PFIZER(Purple Top)SARS-COV-2 Vaccination 05/30/2019, 06/30/2019   PNEUMOCOCCAL CONJUGATE-20 04/04/2021   Pneumococcal Conjugate-13 07/15/2013   Pneumococcal Polysaccharide-23 05/11/2008, 08/19/2014   Tdap 07/15/2013   Zoster Recombinat (Shingrix) 02/24/2019, 08/26/2019  4. Prostate cancer screening- low risk prior PSA trend-update PSA with labs Lab Results  Component Value Date   PSA 0.61 04/04/2021   PSA 0.85 03/03/2020   PSA 0.59 02/24/2019   5. Colon cancer screening - 04/28/2016 with 10-year repeat planned 6. Skin cancer screening- no dermatologist in years-advised regular sunscreen use. Denies worrisome, changing, or new skin lesions.  7. Smoking associated screening (lung cancer screening, AAA screen 65-75, UA)-never smoker 8. STD screening -only active with wife  Status of chronic or acute concerns   #RUQ pain gradually improved with time- did not have gallstones. Did nave some fatty liver but he continues to work on weight loss. If worsened could consider ct abd/pelvis with  calcifications  %# CAD- follows with Dr. [facility] in the past now with Dr. Agustin Cree with [facility] with aspirin and atorvastatin 40 mg and omega-3 fatty acids Symptoms-no chest pain or shortness of breath   -1 stent 2015 Lab Results  Component Value Date   CHOL 128 12/14/2021   HDL 33.40 (L) 12/14/2021   LDLCALC 56 12/14/2021   LDLDIRECT 82.0 04/04/2021   TRIG 194.0 (H) 12/14/2021   CHOLHDL 4 12/14/2021  A/P: CAD asymptomatic- continue current medications  LDL goal under 70-at goal within 6 months- continue current medications   #hypertension S: compliant with coreg 3.125mg  BID, lisinopril 40mg   A/P: stable- continue current medicines   %# Diabetes with history of diabetic retinopathy S: followed closely by Dr. Cruzita Lederer and on both long and short acting insulin, Ozempic, metformin 1000 mg twice daily (takes B12 for this), Jardiance 25 mg.   Has history retinopathy- has been stable  A/P: doing well on most recent a1c- sees Dr. Cruzita Lederer in 12 days- we will update a1c with labs today  # Multiple thyroid nodules-I am in [place] FN 02/12/2019 benign with Dr. Cruzita Lederer. Tsh just checked in september  Lab Results  Component Value Date   TSH 1.61 12/14/2021   #fatty liver- incidental finding- working on weight loss  Recommended follow up: Return in about 6 months (around 10/06/2022) for followup or sooner if needed.Schedule b4 you leave. Future Appointments  Date Time Provider [place]  04/19/2022  8:40 AM Philemon Kingdom, MD [facility] None  06/12/2022  2:00 PM Park Liter, MD [facility] None  08/21/2022 11:30 AM [facility] HEALTH COACH [facility]   Lab/Order associations:ate at 12 30 so still fasting as 9 hours out   ICD-10-CM   1. Preventative health care  Z00.00     2. Type 2 diabetes mellitus with other circulatory complication, without long-term current use of insulin (HCC)  E11.59 CBC with Differential/Platelet    Comprehensive metabolic  panel    Hemoglobin A1c    Microalbumin / creatinine urine ratio    3. Essential hypertension  I10     4. Nocturia  R35.1 PSA      No orders of the defined types were  placed in this encounter.   Return precautions advised.  Garret Reddish, MD

## 2022-04-07 NOTE — Patient Instructions (Addendum)
Team please log covid shot 02/23/22 at [employer] job on weight loss!   Please stop by lab before you go If you have mychart- we will send your results within 3 business days of [place] receiving them.  If you do not have mychart- we will call you about results within 5 business days of [place] receiving them.  *please also note that you will see labs on mychart as soon as they post. I will later go in and write notes on them- will say "notes from Dr. Yong Channel"   Recommended follow up: Return in about 6 months (around 10/06/2022) for followup or sooner if needed.Schedule b4 you leave.

## 2022-04-19 ENCOUNTER — Ambulatory Visit (INDEPENDENT_AMBULATORY_CARE_PROVIDER_SITE_OTHER): Payer: PPO | Admitting: Internal Medicine

## 2022-04-19 ENCOUNTER — Encounter: Payer: Self-pay | Admitting: Internal Medicine

## 2022-04-19 VITALS — BP 128/72 | HR 86 | Ht 70.0 in | Wt 231.0 lb

## 2022-04-19 DIAGNOSIS — E669 Obesity, unspecified: Secondary | ICD-10-CM

## 2022-04-19 DIAGNOSIS — E1159 Type 2 diabetes mellitus with other circulatory complications: Secondary | ICD-10-CM

## 2022-04-19 DIAGNOSIS — E042 Nontoxic multinodular goiter: Secondary | ICD-10-CM | POA: Diagnosis not present

## 2022-04-19 DIAGNOSIS — Z794 Long term (current) use of insulin: Secondary | ICD-10-CM | POA: Diagnosis not present

## 2022-04-19 DIAGNOSIS — E785 Hyperlipidemia, unspecified: Secondary | ICD-10-CM

## 2022-04-19 NOTE — Progress Notes (Signed)
Patient ID: Zachary Warner, male   DOB: [birth year], 67 y.o.   MRN: [id number]  HPI: Zachary Warner is a 67 y.o.-year-old male, presenting for f/u for DM2, dx in ~1995, insulin-dependent since ~2005, uncontrolled, with complications (CAD - s/p AMI, DR). Last visit 4 months ago.  Interim history: No increased urination, blurry vision, nausea, chest pain.   He had 1 or 2 more episodes of penile itching, which resolved with using Azo.  DM2:  Reviewed HbA1c levels: Lab Results  Component Value Date   HGBA1C 7.2 (H) 04/07/2022   HGBA1C 6.8 (A) 12/14/2021   HGBA1C 7.0 (A) 08/02/2021  07/22/2020: HbA1c calculated from fructosamine is 6.16%, excellent. 03/31/2020: HbA1c calculated from fructosamine is at goal, at 6.1%. 11/25/2019: HbA1c calculated from fructosamine is 6.2% 01/10/2019: HbA1c calculated from fructosamine 6.35% 04/09/2017: HbA1c calculated from fructosamine is 6.8% 11/28/2017:  Hba1c calculated from fructosamine is better: 6.45% 07/11/2017: HbA1c calculated from the fructosamine is 6.6%. 03/08/2017: HbA1c calculated from the fructosamine is 6.5%. 11/06/2016: HbA1c calculated from the fructosamine is slightly higher, at 6.6% 07/05/2016: HbA1c calculated from the fructosamine is 6.2% 04/05/2016: HbA1c calculated from fructosamine is excellent, at 6% 01/04/2016: HbA1c calculated from fructosamine is  6.55%. 11/10/2015: HbA1c calculated from fructosamine is 5.8%. 08/10/2015: HbA1c calculated from fructosamine is 7.05%. 04/09/2015: HbA1c calculated fructosamine (326): 7.1%, which is much more concordant with his sugar log.   He is on: - Metformin 1000 mg 2x a day. - Jardiance 25 mg daily before breakfast - Ozempic 0.5 mg weekly-added 07/2019 >> 1 mg weekly - Tresiba U200  >> Toujeo 80 units daily - Humalog: 25-30-40 units before meals We stopped Januvia 2/2 large doses of mealtime insulin. He initially had yeast infections with Jardiance, but now controlled with Azo.  He checks his  sugars 1x a day: - am: 79-128 >> 93-135, 155, 166 >> 89-149, 162 >> 105-120 >> 83-112 - 2h after b'fast: 189-251 >> 244 x1 >> n/c >> 148 >> n/c >> 103-179 - lunch:  126-160 >> n/c >> 130-177 >> 138, 147 >> n/c - 2h after lunch: 158, 160 >> 77-113, 197 >> n/c >> 124, 142, 260 - dinner: 80, 141-158 >> 149-164 >> 147 >> 125 >> n/c >> 120-139 - 2h after dinner: 148-189, 300 >> 200 >> 160, 163 >> n/c - bedtime: 168-189 >> n/c >> 169, 194 >> see below >> 72-164 - nighttime: 200 >> n/c >> 81 >> 90 >> 73 >> 73, 109 >> n/c Low sugar: 50 - at night ... >> 73 (night) >> 89 >> 105 >> 70; has hypoglycemia awareness in the 60s. Highest sugar was 368 ... >> 162 >> 184 >> 260 (oj).  He has a ReliOn meter.  Pt's meals are: - Breakfast: grits or sandwich - Lunch: sandwich - Dinner: chicken or beef or pork + vegetables - Snacks: 1 or 2 snacks  - crackers, peanuts or cookies  No CKD, last BUN/creatinine:  Lab Results  Component Value Date   BUN 19 04/07/2022   CREATININE 0.97 04/07/2022  On lisinopril.  + HL; last set of lipids: Lab Results  Component Value Date   CHOL 128 12/14/2021   HDL 33.40 (L) 12/14/2021   LDLCALC 56 12/14/2021   LDLDIRECT 82.0 04/04/2021   TRIG 194.0 (H) 12/14/2021   CHOLHDL 4 12/14/2021  On Lipitor 40 mg every day and fish oil  1000 mg 1x a day.  - Latest eye exam: 03/25/2022: No DR, prev.+ DR. He was seeing Dr Ulanda [facility] - retired.  Now Dr. Ovid Curd.  - no numbness and tingling in his feet. Last foot exam: 03/2022.  He also has a history of HTN, h/o nephrolithiasis x 3, last in 03/2013, OSA.   He had an MI 07/2014 >> had a stent placed. Cardiologist: Dr Jeryl Columbia. He is on supplementation with B12. He had heel pain, improved after starting to use special socks and using a TENS unit-Plantar fasciitis.  He is retired and works only 3 days a week (10 am to 4 pm).  He loves it.  Thyroid nodules:  Thyroid U/S (01/22/2019): Several nodules, of which  2 were dominant:  Narrative & Impression    Parenchymal Echotexture: Moderately heterogenous Isthmus: 1.0 cm Right lobe: 5.4 x 2.0 x 1.8 cm Left lobe: 4.7 x 2.4 x 2.0 cm _____________________________________   Estimated total number of nodules >/= 1 cm: 5 _____________________________________   Nodule # 1: Location: Isthmus; Mid Maximum size: 1.9 cm; Other 2 dimensions: 1.3 x 1.7 cm Composition: cannot determine (2) Echogenicity: hypoechoic (2) Echogenic foci: macrocalcifications (1) ACR TI-RADS total points: 5.  **Given size (>/= 1.5 cm) and appearance, fine needle aspiration of this moderately suspicious nodule should be considered based on TI-RADS criteria. _________________________________________________________   Nodule # 2: Location: Right; Superior Maximum size: 1.0 cm; Other 2 dimensions: 0.6 x 0.7 cm Composition: solid/almost completely solid (2) Echogenicity: hypoechoic (2) Echogenic foci: punctate echogenic foci (3) ACR TI-RADS total points: 7.  **Given size (>/= 1.0 cm) and appearance, fine needle aspiration of this highly suspicious nodule should be considered based on TI-RADS criteria. _________________________________________________________   Nodule #3 is a predominantly cystic nodule the mid right thyroid lobe that measures 1.1 x 0.8 x 0.8 cm.   Nodule #4 is a cystic nodule in the superior left thyroid lobe that measures 1.5 x 0.9 x 1.0 cm.   Nodule #5 is a mildly complex cystic nodule with septations in the mid left thyroid lobe that measures 1.2 x 1.0 x 1.3 cm.   Nodule # 6: Location: Left; Mid Maximum size: 0.8 cm; Other 2 dimensions: 0.6 x 0.5 cm Composition: mixed cystic and solid (1) Echogenicity: isoechoic (1) Shape: taller-than-wide (3) ACR TI-RADS total points: 5. Given size (<0.9 cm) and appearance, this nodule does NOT meet TI-RADS criteria for biopsy or dedicated follow-up. _______________________________________   IMPRESSION: 1.  Multinodular goiter. 2. Nodule #1 in the isthmus and nodule #2 in the superior right thyroid lobe both meet criteria for ultrasound-guided biopsy. 3. Multiple cystic nodules that do not meet criteria for biopsy.   The above is in keeping with the ACR TI-RADS recommendations - J Am Coll Radiol 2017;14:587-595.   Electronically Signed   By: Markus Daft M.D.   On: 01/22/2019 17:11    Biopsies of the dominant nodules (02/13/2019): Benign:  Clinical History: Nodule 1 Isthmus Mid 1.9 cm; Other 2 dimensions: 1.3 x  1.7 cm, Hypoechoic, ACR TI-RADS total points: 5, Moderately suspicious  nodule  Specimen Submitted:  A. THYROID ISTHMUS, FINE NEEDLE ASPIRATION:   FINAL MICROSCOPIC DIAGNOSIS:  - Consistent with benign follicular nodule (Bethesda category II)   SPECIMEN ADEQUACY:  Satisfactory for evaluation   CYTOLOGY - NON PAP  CASE: [id number]  PATIENT: Eustace Aubry  Non-Gynecological Cytology Report   Clinical History: Nodule 2 Right Superior 1.0 cm; Other 2 dimensions:  0.6 x 0.7 cm, Solid almost completely solid, Hypoechoic, ACR TI-RADS  total points: 7, Highly suspicious  nodule  Specimen Submitted:  A. THYROID, RT LOBE  RUP, FINE NEEDLE ASPIRATION:   FINAL MICROSCOPIC DIAGNOSIS:  - Consistent with benign follicular nodule (Bethesda category II)   SPECIMEN ADEQUACY:  Satisfactory for evaluation  Thyroid ultrasound (04/20/2020):  Parenchymal Echotexture: Moderately heterogeneous Isthmus: 0.8 cm Right lobe: 4.4 x 2.0 x 1.6 cm Left lobe: 4.8 x 2.0 x 1.8 cm _________________________________________________________  Nodule 1: 1.1 x 1.1 x 0.7 cm nodule located in the isthmus has decreased in size since prior examination where it measured 1.9 x 1.7 x 1.3 cm. FNA of this nodule was performed on 02/12/2019. Please correlate with results.  Nodule 2: 1.0 x 0.9 x 0.6 cm nodule located in the superior right thyroid lobe is not significantly changed in size measuring 1.0 x 0.7 x  0.6 cm on the prior exam. FNA of this nodule was performed on 02/12/2019. Please correlate with results.  Nodule 3: 1.2 x 0.9 x 0.7 cm cystic nodule located in the mid right thyroid lobe is not significantly changed in size since prior exam where it measured 1.1 x 0.8 x 0.8 cm. It does not meet criteria for FNA or imaging follow-up.  Nodule 4: 0.6 x 0.6 x 0.3 cm solid hypoechoic nodule in the inferior right thyroid lobe is new since the prior examination. It does not meet criteria for FNA or imaging follow-up.  Nodule 5: 1.6 x 0.9 x 0.8 cm cystic nodule in the superior left thyroid lobe is not significantly changed in size since prior examination when where measured 1.5 x 1.0 x 0.9 cm. This does not meet criteria for FNA or imaging follow-up.  Nodule 6: 0.8 x 0.7 x 0.6 cm solid isoechoic nodule in the mid left thyroid lobe previously measured 1.2 x 1.0 x 1.3 cm. Interval decrease in size favors a benign etiology. This nodule does not meet criteria for imaging follow-up or FNA. _________________________________________________________  IMPRESSION: Bilateral thyroid nodules. Nodules 1 and 2 were previously biopsied. Please correlate with results. Remaining nodules do not meet criteria for FNA or imaging follow-up.  Pt denies: - feeling nodules in neck - hoarseness - dysphagia - choking  Latest TSH was normal: Lab Results  Component Value Date   TSH 1.61 12/14/2021   ROS: + See HPI  I reviewed pt's medications, allergies, PMH, social hx, family hx, and changes were documented in the history of present illness. Otherwise, unchanged from my initial visit note.  Past Medical History:  Diagnosis Date   Allergy    Diabetes mellitus    Hyperlipidemia    Hypertension    Myocardial infarction [facility])    May 2016   Nephrolithiasis    3x 2007-2015, passed on his own   Sleep apnea    wears CPAP   Past Surgical History:  Procedure Laterality Date   CARDIAC CATHETERIZATION      stent May 2016   COLONOSCOPY     heart stent     May 2016   HERNIA REPAIR     Social History   Socioeconomic History   Marital status: Married    Spouse name: Not on file   Number of children: Not on file   Years of education: Not on file   Highest education level: Not on file  Occupational History   Not on file  Tobacco Use   Smoking status: Never   Smokeless tobacco: Never  Substance and Sexual Activity   Alcohol use: No   Drug use: No   Sexual activity: Never  Other Topics Concern   Not on file  Social History Narrative   Married. 2 children son and daughter. Granddaughter 12/02/2014 born and grandson 7/17//2018.       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      Hobbies: golfing (low to mid 81s)   Social Determinants of Corporate investment banker Strain: Low Risk  (08/08/2021)   Overall Financial Resource Strain (CARDIA)    Difficulty of Paying Living Expenses: Not hard at all  Food Insecurity: No Food Insecurity (08/08/2021)   Hunger Vital Sign    Worried About Running Out of Food in the Last Year: Never true    Ran Out of Food in the Last Year: Never true  Transportation Needs: No Transportation Needs (08/08/2021)   PRAPARE - Administrator, Civil Service (Medical): No    Lack of Transportation (Non-Medical): No  Physical Activity: Sufficiently Active (08/08/2021)   Exercise Vital Sign    Days of Exercise per Week: 3 days    Minutes of Exercise per Session: 120 min  Stress: No Stress Concern Present (08/08/2021)   [employer] of Occupational Health - Occupational Stress Questionnaire    Feeling of Stress : Not at all  Social Connections: Moderately Integrated (08/08/2021)   Social Connection and Isolation Panel [NHANES]    Frequency of Communication with Friends and Family: More than three times a week    Frequency of Social Gatherings with Friends and Family: More than three times a week    Attends  Religious Services: More than 4 times per year    Active Member of [employer] or Organizations: No    Attends Banker Meetings: Never    Marital Status: Married  Catering manager Violence: Not At Risk (08/08/2021)   Humiliation, Afraid, Rape, and Kick questionnaire    Fear of Current or Ex-Partner: No    Emotionally Abused: No    Physically Abused: No    Sexually Abused: No   Current Outpatient Medications on File Prior to Visit  Medication Sig Dispense Refill   aspirin 81 MG tablet Take 81 mg by mouth daily.     atorvastatin (LIPITOR) 40 MG tablet Take 1 tablet (40 mg total) by mouth daily. 90 tablet 3   carvedilol (COREG) 3.125 MG tablet Take 1 tablet (3.125 mg total) by mouth 2 times daily. 60 tablet 3   EQ FIBER SUPPLEMENT PO Take 1 tablet by mouth daily. Unknown strength     fluticasone (FLONASE) 50 MCG/ACT nasal spray Place 1 spray into both nostrils daily as needed for allergies or rhinitis.     insulin lispro (HUMALOG KWIKPEN) 100 UNIT/ML KwikPen INJECT 28-48 UNITS SUBCUTANEOUSLY THREE TIMES DAILY 45 mL 0   JARDIANCE 25 MG TABS tablet TAKE ONE TABLET (25mg ) BY MOUTH DAILY (Patient taking differently: Take 25 mg by mouth daily.) 90 tablet 5   lisinopril (ZESTRIL) 40 MG tablet Take 1 tablet (40 mg total) by mouth daily. 30 tablet 3   metFORMIN (GLUCOPHAGE) 1000 MG tablet Take 1 tablet (1,000 mg total) by mouth 2 (two) times daily with a meal. 180 tablet 3   nitroGLYCERIN (NITROSTAT) 0.4 MG SL tablet PLACE 1 TABLET UNDER TONGUE EVERY 5 MINUTES AS NEEDED FOR CHEST PAIN. CALL 911/DOCTOR IF YOU TAKE 2 TABLETS. max of 3 tablets/day. (Patient taking differently: Place 0.4 mg under the tongue every 5 (five) minutes as needed for chest pain.) 25 tablet 3   Omega-3 Fatty Acids (FISH OIL) 1000 MG CAPS Take 4  capsules by mouth daily.     OZEMPIC, 1 MG/DOSE, 4 MG/3ML SOPN Inject 1 mg into the skin once a week. 9 mL 3   TOUJEO MAX SOLOSTAR 300 UNIT/ML Solostar Pen Inject 80 Units into the  skin daily. 24 mL 3   vitamin B-12 (CYANOCOBALAMIN) 500 MCG tablet Take 500 mcg by mouth daily.     No current facility-administered medications on file prior to visit.   Allergies  Allergen Reactions   Invokana [Canagliflozin] Other (See Comments)    Yeast infection   Family History  Problem Relation Age of Onset   Coronary artery disease Father    Liver disease Father        from heart meds per pt   Diabetes Sister        grandmother   Kidney disease Sister        dialysis from dm   Diabetes Sister    Other Sister        flash pulmonary edema led to death   Colon cancer Neg Hx    Esophageal cancer Neg Hx    Rectal cancer Neg Hx    Stomach cancer Neg Hx     PE: BP 128/72 (BP Location: Right Arm, Patient Position: Sitting, Cuff Size: Normal)   Pulse 86   Ht 5\' 10"  (1.778 m)   Wt 231 lb (104.8 kg)   SpO2 98%   BMI 33.15 kg/m   Wt Readings from Last 3 Encounters:  04/19/22 231 lb (104.8 kg)  04/07/22 232 lb 9.6 oz (105.5 kg)  12/14/21 238 lb 3.2 oz (108 kg)   Constitutional: overweight, in NAD Eyes: EOMI, no exophthalmos ENT:no thyromegaly but left thyroid nodule felt on palpation, no cervical lymphadenopathy Cardiovascular: RRR, No MRG Respiratory: CTA B Musculoskeletal: no deformities Skin: no rashes Neurological: no tremor with outstretched hands  ASSESSMENT: 1. DM2, insulin-dependent, controlled, with complications - CAD, s/p AMi - DR  He does not have a history of pancreatitis or family history of medullary thyroid cancer.    2. HL  3.  Obesity class I  4.  Thyroid nodules  PLAN:  1. Patient with longstanding, fairly well-controlled type 2 diabetes, on a complex medication regimen with metformin, SGLT2 inhibitor, basal/bolus insulin regimen and weekly GLP-1 receptor agonist, with improving HbA1c at last visit, but recently increased HbA1c, at 7.2%, obtained earlier this month.  At last visit, sugars appears to be at goal in the morning but he was not  checking later in the day and I advised him to try to do so.  We did not change the regimen but then, as HbA1c was 6.8%, improved. -At today's visit, sugars appear to be at goal mostly, with few hyperglycemic spikes, due to dietary indiscretions.  However, he has lower blood sugars in the 70s after dinner and we discussed about reducing the dose of Humalog that he takes before this meal from 40 to 35 units at the highest.  Otherwise, we can continue the current regimen. - I suggested to:  Patient Instructions  Please continue: - Metformin 1000 mg 2x a day. - Jardiance 25 mg daily before breakfast  - Toujeo 80 units daily - Ozempic 1 mg weekly  Please reduce: - Humalog 25-30-35 units before meals  Please have another thyroid ultrasound.   Please return in 4 months with your sugar log.  - advised to check sugars at different times of the day - 3-4x a day, rotating check times - advised for yearly eye exams >>  he is UTD - return to clinic in 4 months  2. HL -Reviewed latest lipid panel from 11/2021: LDL at goal, triglycerides slightly high, LDL slightly low: Lab Results  Component Value Date   CHOL 128 12/14/2021   HDL 33.40 (L) 12/14/2021   LDLCALC 56 12/14/2021   LDLDIRECT 82.0 04/04/2021   TRIG 194.0 (H) 12/14/2021   CHOLHDL 4 12/14/2021  -He continues on Lipitor 40 mg daily and fish oil 1000 mg daily-tolerated well  3.  Obesity class I -continue SGLT 2 inhibitor and GLP-1 receptor agonist which should also help with weight loss -Weight was stable at last visit; he lost 7 lbs since then!  4.  Thyroid nodules -Initially felt on palpation -No neck compression symptoms -Initially felt on palpation -Latest PSA age was normal: Lab Results  Component Value Date   TSH 1.61 12/14/2021  -Reviewed latest thyroid ultrasound report from 03/2020: She had 2 dominant nodules which were biopsied with benign results.  The rest of the nodules were small and not worrisome - no follow-up is  needed for these. -We will repeat another ultrasound now  Carlus Pavlovristina Sarim Rothman, MD PhD [facility]

## 2022-04-19 NOTE — Patient Instructions (Addendum)
Please continue: - Metformin 1000 mg 2x a day. - Jardiance 25 mg daily before breakfast  - Toujeo 80 units daily - Ozempic 1 mg weekly  Please reduce: - Humalog 25-30-35 units before meals  Please have another thyroid ultrasound.   Please return in 4 months with your sugar log.

## 2022-04-27 ENCOUNTER — Other Ambulatory Visit: Payer: Self-pay | Admitting: Internal Medicine

## 2022-04-27 DIAGNOSIS — Z794 Long term (current) use of insulin: Secondary | ICD-10-CM

## 2022-05-08 ENCOUNTER — Ambulatory Visit
Admission: RE | Admit: 2022-05-08 | Discharge: 2022-05-08 | Disposition: A | Discharge status: Home / Self Care | Payer: PPO | Source: Ambulatory Visit | Attending: Internal Medicine | Admitting: Internal Medicine

## 2022-05-08 DIAGNOSIS — E042 Nontoxic multinodular goiter: Secondary | ICD-10-CM

## 2022-05-29 ENCOUNTER — Other Ambulatory Visit: Payer: Self-pay | Admitting: Internal Medicine

## 2022-05-29 DIAGNOSIS — Z794 Long term (current) use of insulin: Secondary | ICD-10-CM

## 2022-06-12 ENCOUNTER — Other Ambulatory Visit: Payer: Self-pay | Admitting: Family Medicine

## 2022-06-12 ENCOUNTER — Ambulatory Visit: Payer: HMO | Admitting: Cardiology

## 2022-07-06 ENCOUNTER — Telehealth: Payer: Self-pay

## 2022-07-06 NOTE — Telephone Encounter (Signed)
Pharmacy called pt Ozempic requires a PA.

## 2022-07-11 ENCOUNTER — Telehealth: Payer: Self-pay

## 2022-07-11 NOTE — Telephone Encounter (Signed)
Patient Advocate Encounter   Received notification from pt msgs that prior authorization is required for Ozempic  Submitted: 07/11/22 Key [id number]  Status is pending

## 2022-07-13 NOTE — Telephone Encounter (Signed)
Pharmacy Patient Advocate Encounter  Prior Authorization for Ozempic has been approved by [employer] (ins).    Effective dates: 07/13/22 through 07/13/23

## 2022-07-24 ENCOUNTER — Telehealth: Payer: Self-pay | Admitting: Family Medicine

## 2022-07-24 NOTE — Telephone Encounter (Signed)
Copied from CRM [id number]. Topic: Medicare AWV >> Jul 24, 2022  1:31 PM Gwenith Spitz wrote: Reason for CRM: Called patient to reschedule Medicare Annual Wellness Visit (AWV). Left message for patient to call back and reschedule Medicare Annual Wellness Visit (AWV).  Last date of AWV: 08/09/2022  Please schedule an appointment at any time with Inetta Fermo, [facility]. Please reschedule AWVS with Inetta Fermo, [facility].  If any questions, please contact me at [phone].  Thank you ,  Gabriel Cirri [facility] AWV TEAM Direct Dial [phone]

## 2022-07-31 ENCOUNTER — Telehealth: Payer: Self-pay | Admitting: Family Medicine

## 2022-07-31 NOTE — Telephone Encounter (Signed)
Contacted Zachary Warner to schedule their annual wellness visit. Appointment made for 08/22/2022.  Gabriel Cirri [facility] AWV TEAM Direct Dial [phone]

## 2022-08-13 ENCOUNTER — Other Ambulatory Visit: Payer: Self-pay | Admitting: Internal Medicine

## 2022-08-13 DIAGNOSIS — E1159 Type 2 diabetes mellitus with other circulatory complications: Secondary | ICD-10-CM

## 2022-08-18 ENCOUNTER — Encounter: Payer: Self-pay | Admitting: Internal Medicine

## 2022-08-18 ENCOUNTER — Ambulatory Visit (INDEPENDENT_AMBULATORY_CARE_PROVIDER_SITE_OTHER): Payer: PPO | Admitting: Internal Medicine

## 2022-08-18 VITALS — BP 120/72 | HR 86 | Ht 70.0 in | Wt 235.6 lb

## 2022-08-18 DIAGNOSIS — Z7984 Long term (current) use of oral hypoglycemic drugs: Secondary | ICD-10-CM

## 2022-08-18 DIAGNOSIS — E669 Obesity, unspecified: Secondary | ICD-10-CM

## 2022-08-18 DIAGNOSIS — Z794 Long term (current) use of insulin: Secondary | ICD-10-CM

## 2022-08-18 DIAGNOSIS — E042 Nontoxic multinodular goiter: Secondary | ICD-10-CM

## 2022-08-18 DIAGNOSIS — E119 Type 2 diabetes mellitus without complications: Secondary | ICD-10-CM

## 2022-08-18 DIAGNOSIS — E1159 Type 2 diabetes mellitus with other circulatory complications: Secondary | ICD-10-CM | POA: Diagnosis not present

## 2022-08-18 DIAGNOSIS — Z7985 Long-term (current) use of injectable non-insulin antidiabetic drugs: Secondary | ICD-10-CM | POA: Diagnosis not present

## 2022-08-18 DIAGNOSIS — E785 Hyperlipidemia, unspecified: Secondary | ICD-10-CM

## 2022-08-18 DIAGNOSIS — E66811 Obesity, class 1: Secondary | ICD-10-CM

## 2022-08-18 LAB — POCT GLYCOSYLATED HEMOGLOBIN (HGB A1C): Hemoglobin A1C: 6.9 % — AB (ref 4.0–5.6)

## 2022-08-18 NOTE — Progress Notes (Signed)
Patient ID: Zachary Warner, male   DOB: 04/20/1955, 67 y.o.   MRN: [id number]  HPI: Zachary Warner is a 67 y.o.-year-old male, presenting for f/u for DM2, dx in ~1995, insulin-dependent since ~2005, uncontrolled, with complications (CAD - s/p AMI, DR). Last visit 4 months ago.  Interim history: No increased urination, blurry vision, nausea, chest pain.   He failed the hearing test-cannot hear high-pitched voices.  He will need hearing aids.  DM2:  Reviewed HbA1c levels: Lab Results  Component Value Date   HGBA1C 7.2 (H) 04/07/2022   HGBA1C 6.8 (A) 12/14/2021   HGBA1C 7.0 (A) 08/02/2021  07/22/2020: HbA1c calculated from fructosamine is 6.16%, excellent. 03/31/2020: HbA1c calculated from fructosamine is at goal, at 6.1%. 11/25/2019: HbA1c calculated from fructosamine is 6.2% 01/10/2019: HbA1c calculated from fructosamine 6.35% 04/09/2017: HbA1c calculated from fructosamine is 6.8% 11/28/2017:  Hba1c calculated from fructosamine is better: 6.45% 07/11/2017: HbA1c calculated from the fructosamine is 6.6%. 03/08/2017: HbA1c calculated from the fructosamine is 6.5%. 11/06/2016: HbA1c calculated from the fructosamine is slightly higher, at 6.6% 07/05/2016: HbA1c calculated from the fructosamine is 6.2% 04/05/2016: HbA1c calculated from fructosamine is excellent, at 6% 01/04/2016: HbA1c calculated from fructosamine is  6.55%. 11/10/2015: HbA1c calculated from fructosamine is 5.8%. 08/10/2015: HbA1c calculated from fructosamine is 7.05%. 04/09/2015: HbA1c calculated fructosamine (326): 7.1%, which is much more concordant with his sugar log.   He is on: - Metformin 1000 mg 2x a day. - Jardiance 25 mg daily before breakfast - Ozempic 0.5 mg weekly-added 07/2019 >> 1 mg weekly - Tresiba U200  >> Toujeo 80 units daily - Humalog: 25-30-40 units before meals >> 25-30-35 (36-38) units before meals We stopped Januvia 2/2 large doses of mealtime insulin. He initially had yeast infections with  Jardiance, but now controlled with Azo.  He checks his sugars 1x a day: - am: 93-135, 155, 166 >> 89-149, 162 >> 105-120 >> 83-112 >> 104-146 - 2h after b'fast: 189-251 >> 244 x1 >> n/c >> 148 >> n/c >> 103-179 - lunch:  126-160 >> n/c >> 130-177 >> 138, 147 >> n/c >> 67 - 2h after lunch: 77-113, 197 >> n/c >> 124, 142, 260 >> 106, 120, 222, 266 (no insulin) - dinner:  149-164 >> 147 >> 125 >> n/c >> 120-139 >> 72, 130, 147 - 2h after dinner: 148-189, 300 >> 200 >> 160, 163 >> n/c - bedtime: 168-189 >> n/c >> 169, 194 >> see below >> 72-164 >> 67, 72 - nighttime: n/c >> 81 >> 90 >> 73 >> 73, 109 >> n/c >> 67 x2, 71, 79, 143 Low sugar: 50 - at night ... >> 73 (night) >> 89 >> 105 >> 70 >> 67; has hypoglycemia awareness in the 60s. Highest sugar was 368 ... >> 162 >> 184 >> 260 (oj) >> 222  He has a ReliOn meter.  Pt's meals are: - Breakfast: grits or sandwich - Lunch: sandwich - Dinner: chicken or beef or pork + vegetables - Snacks: 1 or 2 snacks  - crackers, peanuts or cookies  No CKD, last BUN/creatinine:  Lab Results  Component Value Date   BUN 19 04/07/2022   CREATININE 0.97 04/07/2022  On lisinopril.  + HL; last set of lipids: Lab Results  Component Value Date   CHOL 128 12/14/2021   HDL 33.40 (L) 12/14/2021   LDLCALC 56 12/14/2021   LDLDIRECT 82.0 04/04/2021   TRIG 194.0 (H) 12/14/2021   CHOLHDL 4 12/14/2021  On Lipitor 40 mg every day and  fish oil  1000 mg 1x a day.  - Latest eye exam: 03/25/2022: No DR, prev.+ DR. He was seeing Dr Ambrose Mantle [facility] - retired.  Now Dr. Drucilla Schmidt.  - no numbness and tingling in his feet. Last foot exam: 03/2022.  He also has a history of HTN, h/o nephrolithiasis x 3, last in 03/2013, OSA.   He had an MI 07/2014 >> had a stent placed. Cardiologist: Dr Sedonia Small. He is on supplementation with B12. He had heel pain, improved after starting to use special socks and using a TENS unit-Plantar fasciitis.  He is retired and works  only 3 days a week (10 am to 4 pm).  He loves it.  Thyroid nodules:  Thyroid U/S (01/22/2019): Several nodules, of which 2 were dominant: Parenchymal Echotexture: Moderately heterogenous Isthmus: 1.0 cm Right lobe: 5.4 x 2.0 x 1.8 cm Left lobe: 4.7 x 2.4 x 2.0 cm _____________________________________   Estimated total number of nodules >/= 1 cm: 5 _____________________________________   Nodule # 1: Location: Isthmus; Mid Maximum size: 1.9 cm; Other 2 dimensions: 1.3 x 1.7 cm Composition: cannot determine (2) Echogenicity: hypoechoic (2) Echogenic foci: macrocalcifications (1) ACR TI-RADS total points: 5.  **Given size (>/= 1.5 cm) and appearance, fine needle aspiration of this moderately suspicious nodule should be considered based on TI-RADS criteria. _________________________________________________________   Nodule # 2: Location: Right; Superior Maximum size: 1.0 cm; Other 2 dimensions: 0.6 x 0.7 cm Composition: solid/almost completely solid (2) Echogenicity: hypoechoic (2) Echogenic foci: punctate echogenic foci (3) ACR TI-RADS total points: 7.  **Given size (>/= 1.0 cm) and appearance, fine needle aspiration of this highly suspicious nodule should be considered based on TI-RADS criteria. _________________________________________________________   Nodule #3 is a predominantly cystic nodule the mid right thyroid lobe that measures 1.1 x 0.8 x 0.8 cm.   Nodule #4 is a cystic nodule in the superior left thyroid lobe that measures 1.5 x 0.9 x 1.0 cm.   Nodule #5 is a mildly complex cystic nodule with septations in the mid left thyroid lobe that measures 1.2 x 1.0 x 1.3 cm.   Nodule # 6: Location: Left; Mid Maximum size: 0.8 cm; Other 2 dimensions: 0.6 x 0.5 cm Composition: mixed cystic and solid (1) Echogenicity: isoechoic (1) Shape: taller-than-wide (3) ACR TI-RADS total points: 5. Given size (<0.9 cm) and appearance, this nodule does NOT meet TI-RADS criteria  for biopsy or dedicated follow-up. _______________________________________   IMPRESSION: 1. Multinodular goiter. 2. Nodule #1 in the isthmus and nodule #2 in the superior right thyroid lobe both meet criteria for ultrasound-guided biopsy. 3. Multiple cystic nodules that do not meet criteria for biopsy.   Biopsies of the dominant nodules (02/13/2019): Benign:  Clinical History: Nodule 1 Isthmus Mid 1.9 cm; Other 2 dimensions: 1.3 x  1.7 cm, Hypoechoic, ACR TI-RADS total points: 5, Moderately suspicious  nodule  Specimen Submitted:  A. THYROID ISTHMUS, FINE NEEDLE ASPIRATION:   FINAL MICROSCOPIC DIAGNOSIS:  - Consistent with benign follicular nodule (Bethesda category II)   SPECIMEN ADEQUACY:  Satisfactory for evaluation   CYTOLOGY - NON PAP  CASE: [id number]  PATIENT: Burnis Wehling  Non-Gynecological Cytology Report   Clinical History: Nodule 2 Right Superior 1.0 cm; Other 2 dimensions:  0.6 x 0.7 cm, Solid almost completely solid, Hypoechoic, ACR TI-RADS  total points: 7, Highly suspicious  nodule  Specimen Submitted:  A. THYROID, RT LOBE RUP, FINE NEEDLE ASPIRATION:   FINAL MICROSCOPIC DIAGNOSIS:  - Consistent with benign follicular nodule (Bethesda category  II)   SPECIMEN ADEQUACY:  Satisfactory for evaluation  Thyroid ultrasound (04/20/2020):  Parenchymal Echotexture: Moderately heterogeneous Isthmus: 0.8 cm Right lobe: 4.4 x 2.0 x 1.6 cm Left lobe: 4.8 x 2.0 x 1.8 cm _________________________________________________________  Nodule 1: 1.1 x 1.1 x 0.7 cm nodule located in the isthmus has decreased in size since prior examination where it measured 1.9 x 1.7 x 1.3 cm. FNA of this nodule was performed on 02/12/2019. Please correlate with results.  Nodule 2: 1.0 x 0.9 x 0.6 cm nodule located in the superior right thyroid lobe is not significantly changed in size measuring 1.0 x 0.7 x 0.6 cm on the prior exam. FNA of this nodule was performed on 02/12/2019. Please  correlate with results.  Nodule 3: 1.2 x 0.9 x 0.7 cm cystic nodule located in the mid right thyroid lobe is not significantly changed in size since prior exam where it measured 1.1 x 0.8 x 0.8 cm. It does not meet criteria for FNA or imaging follow-up.  Nodule 4: 0.6 x 0.6 x 0.3 cm solid hypoechoic nodule in the inferior right thyroid lobe is new since the prior examination. It does not meet criteria for FNA or imaging follow-up.  Nodule 5: 1.6 x 0.9 x 0.8 cm cystic nodule in the superior left thyroid lobe is not significantly changed in size since prior examination when where measured 1.5 x 1.0 x 0.9 cm. This does not meet criteria for FNA or imaging follow-up.  Nodule 6: 0.8 x 0.7 x 0.6 cm solid isoechoic nodule in the mid left thyroid lobe previously measured 1.2 x 1.0 x 1.3 cm. Interval decrease in size favors a benign etiology. This nodule does not meet criteria for imaging follow-up or FNA. _________________________________________________________  IMPRESSION: Bilateral thyroid nodules. Nodules 1 and 2 were previously biopsied. Please correlate with results. Remaining nodules do not meet criteria for FNA or imaging follow-up.  Thyroid U/S (05/08/2022): Parenchymal Echotexture: Moderately heterogenous  Isthmus: 0.9 cm, previously 0.8 cm  Right lobe: 4.7 x 1.8 x 1.5 cm, previously 4.4 x 2.0 x 1.6 cm  Left lobe: 4.3 x 1.9 x 1.9 cm, previously 4.8 x 2.0 x 1.8 cm  _________________________________________________________   Estimated total number of nodules >/= 1 cm: 2 _________________________________________________________   Nodule #1 in the isthmus is heterogeneous but hypoechoic. Nodule measures 1.1 x 0.9 x 1.3 cm and previously measured 1.1 x 0.7 x 1.1 cm. Morphology of the nodule is not significantly changed. This represents the previously biopsied nodule.   Nodul #2 in the superior right thyroid lobe is a hypoechoic nodule with at least 1 small echogenic focus. This  represents the previously biopsied nodule and measures 1.1 x 0.6 x 0.8 cm and previously measured 1.0 x 0.6 x 0.9 cm. Morphology of the nodule is stable.   Multiple bilateral thyroid nodules.   Nodule #5 in the left mid thyroid lobe is a predominantly isoechoic solid nodule with small cystic component. This nodule measures 0.9 x 0.6 x 0.7 cm. This is compatible with a TR 3 nodule and does not meet criteria for biopsy or dedicated follow-up.   IMPRESSION: 1. Bilateral thyroid nodules. 2. Previously biopsied nodules are stable. The other nodules do not meet criteria for biopsy or dedicated follow-up.  Pt denies: - feeling nodules in neck - hoarseness - dysphagia - choking  Latest TSH was normal: Lab Results  Component Value Date   TSH 1.61 12/14/2021   ROS: + See HPI  I reviewed pt's medications, allergies, PMH,  social hx, family hx, and changes were documented in the history of present illness. Otherwise, unchanged from my initial visit note.  Past Medical History:  Diagnosis Date   Allergy    Diabetes mellitus    Hyperlipidemia    Hypertension    Myocardial infarction [facility])    May 2016   Nephrolithiasis    3x 2007-2015, passed on his own   Sleep apnea    wears CPAP   Past Surgical History:  Procedure Laterality Date   CARDIAC CATHETERIZATION     stent May 2016   COLONOSCOPY     heart stent     May 2016   HERNIA REPAIR     Social History   Socioeconomic History   Marital status: Married    Spouse name: Not on file   Number of children: Not on file   Years of education: Not on file   Highest education level: Not on file  Occupational History   Not on file  Tobacco Use   Smoking status: Never   Smokeless tobacco: Never  Substance and Sexual Activity   Alcohol use: No   Drug use: No   Sexual activity: Never  Other Topics Concern   Not on file  Social History Narrative   Married. 2 children son and daughter. Granddaughter 12/02/2014 born and  grandson 7/17//2018.       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      Hobbies: golfing (low to mid 71s)   Social Determinants of Corporate investment banker Strain: Low Risk  (08/08/2021)   Overall Financial Resource Strain (CARDIA)    Difficulty of Paying Living Expenses: Not hard at all  Food Insecurity: No Food Insecurity (08/08/2021)   Hunger Vital Sign    Worried About Running Out of Food in the Last Year: Never true    Ran Out of Food in the Last Year: Never true  Transportation Needs: No Transportation Needs (08/08/2021)   PRAPARE - Administrator, Civil Service (Medical): No    Lack of Transportation (Non-Medical): No  Physical Activity: Sufficiently Active (08/08/2021)   Exercise Vital Sign    Days of Exercise per Week: 3 days    Minutes of Exercise per Session: 120 min  Stress: No Stress Concern Present (08/08/2021)   [employer] of Occupational Health - Occupational Stress Questionnaire    Feeling of Stress : Not at all  Social Connections: Moderately Integrated (08/08/2021)   Social Connection and Isolation Panel [NHANES]    Frequency of Communication with Friends and Family: More than three times a week    Frequency of Social Gatherings with Friends and Family: More than three times a week    Attends Religious Services: More than 4 times per year    Active Member of [employer] or Organizations: No    Attends Banker Meetings: Never    Marital Status: Married  Catering manager Violence: Not At Risk (08/08/2021)   Humiliation, Afraid, Rape, and Kick questionnaire    Fear of Current or Ex-Partner: No    Emotionally Abused: No    Physically Abused: No    Sexually Abused: No   Current Outpatient Medications on File Prior to Visit  Medication Sig Dispense Refill   aspirin 81 MG tablet Take 81 mg by mouth daily.     atorvastatin (LIPITOR) 40 MG tablet Take 1 tablet (40 mg total) by mouth daily. 90 tablet 3  carvedilol (COREG) 3.125 MG tablet Take 1 tablet (3.125 mg total) by mouth 2 times daily. 60 tablet 3   EQ FIBER SUPPLEMENT PO Take 1 tablet by mouth daily. Unknown strength     fluticasone (FLONASE) 50 MCG/ACT nasal spray Place 1 spray into both nostrils daily as needed for allergies or rhinitis.     insulin lispro (HUMALOG KWIKPEN) 100 UNIT/ML KwikPen INJECT 25-35 UNITS SUBCUTANEOUSLY THREE TIMES DAILY 45 mL 0   JARDIANCE 25 MG TABS tablet TAKE ONE TABLET (25mg ) BY MOUTH DAILY (Patient taking differently: Take 25 mg by mouth daily.) 90 tablet 5   lisinopril (ZESTRIL) 40 MG tablet Take 1 tablet by mouth daily. 30 tablet 3   metFORMIN (GLUCOPHAGE) 1000 MG tablet Take 1 tablet (1,000 mg total) by mouth 2 (two) times daily with a meal. 180 tablet 3   nitroGLYCERIN (NITROSTAT) 0.4 MG SL tablet PLACE 1 TABLET UNDER TONGUE EVERY 5 MINUTES AS NEEDED FOR CHEST PAIN. CALL 911/DOCTOR IF YOU TAKE 2 TABLETS. max of 3 tablets/day. (Patient taking differently: Place 0.4 mg under the tongue every 5 (five) minutes as needed for chest pain.) 25 tablet 3   Omega-3 Fatty Acids (FISH OIL) 1000 MG CAPS Take 4 capsules by mouth daily.     OZEMPIC, 1 MG/DOSE, 4 MG/3ML SOPN Inject 1 mg into the skin once a week. 9 mL 3   TOUJEO MAX SOLOSTAR 300 UNIT/ML Solostar Pen Inject 80 Units into the skin daily. 24 mL 3   vitamin B-12 (CYANOCOBALAMIN) 500 MCG tablet Take 500 mcg by mouth daily.     No current facility-administered medications on file prior to visit.   Allergies  Allergen Reactions   Invokana [Canagliflozin] Other (See Comments)    Yeast infection   Family History  Problem Relation Age of Onset   Coronary artery disease Father    Liver disease Father        from heart meds per pt   Diabetes Sister        grandmother   Kidney disease Sister        dialysis from dm   Diabetes Sister    Other Sister        flash pulmonary edema led to death   Colon cancer Neg Hx    Esophageal cancer Neg Hx    Rectal  cancer Neg Hx    Stomach cancer Neg Hx    PE: BP 120/72 (BP Location: Left Arm, Patient Position: Sitting, Cuff Size: Normal)   Pulse 86   Ht 5\' 10"  (1.778 m)   Wt 235 lb 9.6 oz (106.9 kg)   SpO2 97%   BMI 33.81 kg/m   Wt Readings from Last 3 Encounters:  08/18/22 235 lb 9.6 oz (106.9 kg)  04/19/22 231 lb (104.8 kg)  04/07/22 232 lb 9.6 oz (105.5 kg)   Constitutional: overweight, in NAD Eyes: EOMI, no exophthalmos ENT:no thyromegaly but left thyroid nodule felt on palpation, no cervical lymphadenopathy Cardiovascular: RRR, No MRG Respiratory: CTA B Musculoskeletal: no deformities Skin: no rashes Neurological: no tremor with outstretched hands  ASSESSMENT: 1. DM2, insulin-dependent, controlled, with complications - CAD, s/p AMi - DR  He does not have a history of pancreatitis or family history of medullary thyroid cancer.    2. HL  3.  Obesity class I  4.  Thyroid nodules  PLAN:  1. Patient with longstanding, fairly well-controlled type 2 diabetes, on a complex medication regimen with metformin, SGLT2 inhibitor, basal/bolus insulin and weekly GLP-1 receptor agonist,  with still suboptimal control.  At last visit, HbA1c was slightly higher, at 7.2%, increased from 6.8% sugars appears to be mostly at goal with few hyperglycemic spikes, due to dietary indiscretions.  He had some lower blood sugars in the 70s after dinner and I advised him to take less insulin with this meal.  We otherwise continued the same regimen -At today's visit, most of his blood sugars are at goal, but he does have lower blood sugars after dinner and in the middle of the night, down to 67.  I will advise him to reduce the Humalog dose before dinner more.  Otherwise, we can continue the other regimen. - I suggested to:  Patient Instructions  Please continue: - Metformin 1000 mg 2x a day. - Jardiance 25 mg daily before breakfast  - Toujeo 80 units daily - Ozempic 1 mg weekly  Change Humalog: - 25  units before b'fast - 30 units before lunch - 30-32 units before dinner  Please return in 4 months with your sugar log.  - we checked his HbA1c: 6.9%  - better - advised to check sugars at different times of the day - 4x a day, rotating check times - advised for yearly eye exams >> he is UTD - return to clinic in 4 months  2. HL -Reviewed latest lipid panel from 11/2021: LDL at goal, triglycerides slightly high, HDL slightly low, Lab Results  Component Value Date   CHOL 128 12/14/2021   HDL 33.40 (L) 12/14/2021   LDLCALC 56 12/14/2021   LDLDIRECT 82.0 04/04/2021   TRIG 194.0 (H) 12/14/2021   CHOLHDL 4 12/14/2021  -He continues Lipitor 40 mg daily and fish oil 1000 mg daily without side effects  3.  Obesity class I -continue SGLT 2 inhibitor and GLP-1 receptor agonist which should also help with weight loss -He lost 7 pounds before last visit -Since then, he gained 4 pounds -he just returned from a cruise in the [place]  4.  Thyroid nodules - No neck compression symptoms - initially felt on palpation -Latest TSH was normal Lab Results  Component Value Date   TSH 1.61 12/14/2021  -Reviewed thyroid ultrasound report from 03/2020: She had 2 dominant nodules which were biopsied with benign results the rest of the nodules were small and not worrisome. -We obtained another thyroid ultrasound after last visit in 04/2022: Stable nodules  Carlus Pavlov, MD PhD [facility] Endocrinology

## 2022-08-18 NOTE — Patient Instructions (Addendum)
Please continue: - Metformin 1000 mg 2x a day. - Jardiance 25 mg daily before breakfast  - Toujeo 80 units daily - Ozempic 1 mg weekly  Change Humalog: - 25 units before b'fast - 30 units before lunch - 30-32 units before dinner  Please return in 4 months with your sugar log.

## 2022-08-22 ENCOUNTER — Ambulatory Visit (INDEPENDENT_AMBULATORY_CARE_PROVIDER_SITE_OTHER): Payer: PPO

## 2022-08-22 VITALS — Wt 235.0 lb

## 2022-08-22 DIAGNOSIS — Z0000 Encounter for general adult medical examination without abnormal findings: Secondary | ICD-10-CM | POA: Diagnosis not present

## 2022-08-22 NOTE — Patient Instructions (Signed)
Zachary Warner , Thank you for taking time to come for your Medicare Wellness Visit. I appreciate your ongoing commitment to your health goals. Please review the following plan we discussed and let me know if I can assist you in the future.   These are the goals we discussed:  Goals      Patient Stated     None at this time      Patient Stated     Lose a little weight         This is a list of the screening recommended for you and due dates:  Health Maintenance  Topic Date Due   COVID-19 Vaccine (5 - 2023-24 season) 04/20/2022   Hepatitis C Screening  03/24/2098*   Flu Shot  10/26/2022   Hemoglobin A1C  02/18/2023   Eye exam for diabetics  03/26/2023   Yearly kidney function blood test for diabetes  04/08/2023   Yearly kidney health urinalysis for diabetes  04/08/2023   Complete foot exam   04/08/2023   DTaP/Tdap/Td vaccine (2 - Td or Tdap) 07/16/2023   Medicare Annual Wellness Visit  08/22/2023   Colon Cancer Screening  04/28/2026   Pneumonia Vaccine  Completed   Zoster (Shingles) Vaccine  Completed   HPV Vaccine  Aged Out  *Topic was postponed. The date shown is not the original due date.    Advanced directives: Advance directive discussed with you today. Even though you declined this today please call our office should you change your mind and we can give you the proper paperwork for you to fill out  Conditions/risks identified: lose a little weight   Next appointment: Follow up in one year for your annual wellness visit.   Preventive Care 19 Years and Older, Male  Preventive care refers to lifestyle choices and visits with your health care provider that can promote health and wellness. What does preventive care include? A yearly physical exam. This is also called an annual well check. Dental exams once or twice a year. Routine eye exams. Ask your health care provider how often you should have your eyes checked. Personal lifestyle choices, including: Daily care of your  teeth and gums. Regular physical activity. Eating a healthy diet. Avoiding tobacco and drug use. Limiting alcohol use. Practicing safe sex. Taking low doses of aspirin every day. Taking vitamin and mineral supplements as recommended by your health care provider. What happens during an annual well check? The services and screenings done by your health care provider during your annual well check will depend on your age, overall health, lifestyle risk factors, and family history of disease. Counseling  Your health care provider may ask you questions about your: Alcohol use. Tobacco use. Drug use. Emotional well-being. Home and relationship well-being. Sexual activity. Eating habits. History of falls. Memory and ability to understand (cognition). Work and work Astronomer. Screening  You may have the following tests or measurements: Height, weight, and BMI. Blood pressure. Lipid and cholesterol levels. These may be checked every 5 years, or more frequently if you are over 40 years old. Skin check. Lung cancer screening. You may have this screening every year starting at age 50 if you have a 30-pack-year history of smoking and currently smoke or have quit within the past 15 years. Fecal occult blood test (FOBT) of the stool. You may have this test every year starting at age 63. Flexible sigmoidoscopy or colonoscopy. You may have a sigmoidoscopy every 5 years or a colonoscopy every 10 years starting  at age 18. Prostate cancer screening. Recommendations will vary depending on your family history and other risks. Hepatitis C blood test. Hepatitis B blood test. Sexually transmitted disease (STD) testing. Diabetes screening. This is done by checking your blood sugar (glucose) after you have not eaten for a while (fasting). You may have this done every 1-3 years. Abdominal aortic aneurysm (AAA) screening. You may need this if you are a current or former smoker. Osteoporosis. You may be  screened starting at age 70 if you are at high risk. Talk with your health care provider about your test results, treatment options, and if necessary, the need for more tests. Vaccines  Your health care provider may recommend certain vaccines, such as: Influenza vaccine. This is recommended every year. Tetanus, diphtheria, and acellular pertussis (Tdap, Td) vaccine. You may need a Td booster every 10 years. Zoster vaccine. You may need this after age 102. Pneumococcal 13-valent conjugate (PCV13) vaccine. One dose is recommended after age 37. Pneumococcal polysaccharide (PPSV23) vaccine. One dose is recommended after age 47. Talk to your health care provider about which screenings and vaccines you need and how often you need them. This information is not intended to replace advice given to you by your health care provider. Make sure you discuss any questions you have with your health care provider. Document Released: 04/09/2015 Document Revised: 12/01/2015 Document Reviewed: 01/12/2015 Elsevier Interactive Patient Education  2017 [employer].  Fall Prevention in the Home Falls can cause injuries. They can happen to people of all ages. There are many things you can do to make your home safe and to help prevent falls. What can I do on the outside of my home? Regularly fix the edges of walkways and driveways and fix any cracks. Remove anything that might make you trip as you walk through a door, such as a raised step or threshold. Trim any bushes or trees on the path to your home. Use bright outdoor lighting. Clear any walking paths of anything that might make someone trip, such as rocks or tools. Regularly check to see if handrails are loose or broken. Make sure that both sides of any steps have handrails. Any raised decks and porches should have guardrails on the edges. Have any leaves, snow, or ice cleared regularly. Use sand or salt on walking paths during winter. Clean up any spills in  your garage right away. This includes oil or grease spills. What can I do in the bathroom? Use night lights. Install grab bars by the toilet and in the tub and shower. Do not use towel bars as grab bars. Use non-skid mats or decals in the tub or shower. If you need to sit down in the shower, use a plastic, non-slip stool. Keep the floor dry. Clean up any water that spills on the floor as soon as it happens. Remove soap buildup in the tub or shower regularly. Attach bath mats securely with double-sided non-slip rug tape. Do not have throw rugs and other things on the floor that can make you trip. What can I do in the bedroom? Use night lights. Make sure that you have a light by your bed that is easy to reach. Do not use any sheets or blankets that are too big for your bed. They should not hang down onto the floor. Have a firm chair that has side arms. You can use this for support while you get dressed. Do not have throw rugs and other things on the floor that can  make you trip. What can I do in the kitchen? Clean up any spills right away. Avoid walking on wet floors. Keep items that you use a lot in easy-to-reach places. If you need to reach something above you, use a strong step stool that has a grab bar. Keep electrical cords out of the way. Do not use floor polish or wax that makes floors slippery. If you must use wax, use non-skid floor wax. Do not have throw rugs and other things on the floor that can make you trip. What can I do with my stairs? Do not leave any items on the stairs. Make sure that there are handrails on both sides of the stairs and use them. Fix handrails that are broken or loose. Make sure that handrails are as long as the stairways. Check any carpeting to make sure that it is firmly attached to the stairs. Fix any carpet that is loose or worn. Avoid having throw rugs at the top or bottom of the stairs. If you do have throw rugs, attach them to the floor with carpet  tape. Make sure that you have a light switch at the top of the stairs and the bottom of the stairs. If you do not have them, ask someone to add them for you. What else can I do to help prevent falls? Wear shoes that: Do not have high heels. Have rubber bottoms. Are comfortable and fit you well. Are closed at the toe. Do not wear sandals. If you use a stepladder: Make sure that it is fully opened. Do not climb a closed stepladder. Make sure that both sides of the stepladder are locked into place. Ask someone to hold it for you, if possible. Clearly mark and make sure that you can see: Any grab bars or handrails. First and last steps. Where the edge of each step is. Use tools that help you move around (mobility aids) if they are needed. These include: Canes. Walkers. Scooters. Crutches. Turn on the lights when you go into a dark area. Replace any light bulbs as soon as they burn out. Set up your furniture so you have a clear path. Avoid moving your furniture around. If any of your floors are uneven, fix them. If there are any pets around you, be aware of where they are. Review your medicines with your doctor. Some medicines can make you feel dizzy. This can increase your chance of falling. Ask your doctor what other things that you can do to help prevent falls. This information is not intended to replace advice given to you by your health care provider. Make sure you discuss any questions you have with your health care provider. Document Released: 01/07/2009 Document Revised: 08/19/2015 Document Reviewed: 04/17/2014 Elsevier Interactive Patient Education  2017 [employer].

## 2022-08-22 NOTE — Progress Notes (Signed)
I connected with  Zachary Warner on 08/22/22 by a audio enabled telemedicine application and verified that I am speaking with the correct person using two identifiers.  Patient Location: Home  Provider Location: Office/Clinic  I discussed the limitations of evaluation and management by telemedicine. The patient expressed understanding and agreed to proceed.   Patient Medicare AWV questionnaire was completed by the patient on 08/21/22; I have confirmed that all information answered by patient is correct and no changes since this date.      Subjective:   Zachary Warner is a 67 y.o. male who presents for Medicare Annual/Subsequent preventive examination.  Review of Systems     Cardiac Risk Factors include: advanced age (>70men, >65 women);diabetes mellitus;dyslipidemia;hypertension;male gender;obesity (BMI >30kg/m2)     Objective:    Today's Vitals   08/22/22 0903  Weight: 235 lb (106.6 kg)   Body mass index is 33.72 kg/m.     08/22/2022    9:09 AM 08/08/2021   11:37 AM 04/28/2016   10:22 AM 04/17/2016    2:04 PM  Advanced Directives  Does Patient Have a Medical Advance Directive? No No No No  Would patient like information on creating a medical advance directive? No - Patient declined No - Patient declined      Current Medications (verified) Outpatient Encounter Medications as of 08/22/2022  Medication Sig   aspirin 81 MG tablet Take 81 mg by mouth daily.   atorvastatin (LIPITOR) 40 MG tablet Take 1 tablet (40 mg total) by mouth daily.   carvedilol (COREG) 3.125 MG tablet Take 1 tablet (3.125 mg total) by mouth 2 times daily.   EQ FIBER SUPPLEMENT PO Take 1 tablet by mouth daily. Unknown strength   fluticasone (FLONASE) 50 MCG/ACT nasal spray Place 1 spray into both nostrils daily as needed for allergies or rhinitis.   insulin lispro (HUMALOG KWIKPEN) 100 UNIT/ML KwikPen INJECT 25-35 UNITS SUBCUTANEOUSLY THREE TIMES DAILY   JARDIANCE 25 MG TABS tablet TAKE ONE TABLET (25mg )  BY MOUTH DAILY (Patient taking differently: Take 25 mg by mouth daily.)   lisinopril (ZESTRIL) 40 MG tablet Take 1 tablet by mouth daily.   metFORMIN (GLUCOPHAGE) 1000 MG tablet Take 1 tablet (1,000 mg total) by mouth 2 (two) times daily with a meal.   nitroGLYCERIN (NITROSTAT) 0.4 MG SL tablet PLACE 1 TABLET UNDER TONGUE EVERY 5 MINUTES AS NEEDED FOR CHEST PAIN. CALL 911/DOCTOR IF YOU TAKE 2 TABLETS. max of 3 tablets/day. (Patient taking differently: Place 0.4 mg under the tongue every 5 (five) minutes as needed for chest pain.)   Omega-3 Fatty Acids (FISH OIL) 1000 MG CAPS Take 4 capsules by mouth daily.   OZEMPIC, 1 MG/DOSE, 4 MG/3ML SOPN Inject 1 mg into the skin once a week.   TOUJEO MAX SOLOSTAR 300 UNIT/ML Solostar Pen Inject 80 Units into the skin daily.   vitamin B-12 (CYANOCOBALAMIN) 500 MCG tablet Take 500 mcg by mouth daily.   No facility-administered encounter medications on file as of 08/22/2022.    Allergies (verified) Invokana [canagliflozin]   History: Past Medical History:  Diagnosis Date   Allergy    Diabetes mellitus    Hyperlipidemia    Hypertension    Myocardial infarction [facility])    May 2016   Nephrolithiasis    3x 2007-2015, passed on his own   Sleep apnea    wears CPAP   Past Surgical History:  Procedure Laterality Date   CARDIAC CATHETERIZATION     stent May 2016  COLONOSCOPY     heart stent     May 2016   HERNIA REPAIR     Family History  Problem Relation Age of Onset   Coronary artery disease Father    Liver disease Father        from heart meds per pt   Diabetes Sister        grandmother   Kidney disease Sister        dialysis from dm   Diabetes Sister    Other Sister        flash pulmonary edema led to death   Colon cancer Neg Hx    Esophageal cancer Neg Hx    Rectal cancer Neg Hx    Stomach cancer Neg Hx    Social History   Socioeconomic History   Marital status: Married    Spouse name: Not on file   Number of children: Not on  file   Years of education: Not on file   Highest education level: Not on file  Occupational History   Not on file  Tobacco Use   Smoking status: Never   Smokeless tobacco: Never  Substance and Sexual Activity   Alcohol use: No   Drug use: No   Sexual activity: Never  Other Topics Concern   Not on file  Social History Narrative   Married. 2 children son and daughter. Granddaughter 12/02/2014 born and grandson 7/17//2018.       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      Hobbies: golfing (low to mid 49s)   Social Determinants of Corporate investment banker Strain: Low Risk  (08/21/2022)   Overall Financial Resource Strain (CARDIA)    Difficulty of Paying Living Expenses: Not very hard  Food Insecurity: No Food Insecurity (08/21/2022)   Hunger Vital Sign    Worried About Running Out of Food in the Last Year: Never true    Ran Out of Food in the Last Year: Never true  Transportation Needs: No Transportation Needs (08/21/2022)   PRAPARE - Administrator, Civil Service (Medical): No    Lack of Transportation (Non-Medical): No  Physical Activity: Sufficiently Active (08/21/2022)   Exercise Vital Sign    Days of Exercise per Week: 3 days    Minutes of Exercise per Session: 60 min  Stress: No Stress Concern Present (08/21/2022)   [employer] of Occupational Health - Occupational Stress Questionnaire    Feeling of Stress : Not at all  Social Connections: Socially Integrated (08/21/2022)   Social Connection and Isolation Panel [NHANES]    Frequency of Communication with Friends and Family: Once a week    Frequency of Social Gatherings with Friends and Family: Twice a week    Attends Religious Services: More than 4 times per year    Active Member of [employer] or Organizations: Yes    Attends Banker Meetings: 1 to 4 times per year    Marital Status: Married    Tobacco Counseling Counseling given: Not  Answered   Clinical Intake:  Pre-visit preparation completed: Yes  Pain : No/denies pain     BMI - recorded: 33.72 Nutritional Status: BMI > 30  Obese Nutritional Risks: None Diabetes: Yes CBG done?: No Did pt. bring in CBG monitor from home?: No  How often do you need to have someone help you when you read instructions, pamphlets, or other written materials from your doctor or pharmacy?:  1 - Never  Diabetic?Nutrition Risk Assessment:  Has the patient had any N/V/D within the last 2 months?  No  Does the patient have any non-healing wounds?  No  Has the patient had any unintentional weight loss or weight gain?  No   Diabetes:  Is the patient diabetic?  Yes  If diabetic, was a CBG obtained today?  No  Did the patient bring in their glucometer from home?  No  How often do you monitor your CBG's? N/a.   Financial Strains and Diabetes Management:  Are you having any financial strains with the device, your supplies or your medication? No .  Does the patient want to be seen by Chronic Care Management for management of their diabetes?  No  Would the patient like to be referred to a Nutritionist or for Diabetic Management?  No   Diabetic Exams:  Diabetic Eye Exam: Completed 03/25/22 Diabetic Foot Exam: Completed 04/07/22   Interpreter Needed?: No  Information entered by :: Lanier Ensign, LPN   Activities of Daily Living    08/21/2022    6:38 PM  In your present state of health, do you have any difficulty performing the following activities:  Hearing? 0  Vision? 0  Difficulty concentrating or making decisions? 0  Walking or climbing stairs? 0  Dressing or bathing? 0  Doing errands, shopping? 0  Preparing Food and eating ? N  Using the Toilet? N  In the past six months, have you accidently leaked urine? N  Do you have problems with loss of bowel control? N  Managing your Medications? N  Managing your Finances? N  Housekeeping or managing your Housekeeping? N     Patient Care Team: Shelva Majestic, MD as PCP - General (Family Medicine) Carlus Pavlov, MD as Consulting Physician (Internal Medicine) Molly Maduro, Lacretia Leigh, MD as Referring Physician (Internal Medicine)  Indicate any recent Medical Services you may have received from other than [facility] providers in the past year (date may be approximate).     Assessment:   This is a routine wellness examination for Zachary Warner.  Hearing/Vision screen Hearing Screening - Comments:: Pt stated HOH needs hearing aids  Vision Screening - Comments:: Pt follows up with Dr Lanny Cramp for annual eye exams   Dietary issues and exercise activities discussed: Current Exercise Habits: Home exercise routine, Type of exercise: walking, Time (Minutes): 60, Frequency (Times/Week): 3, Weekly Exercise (Minutes/Week): 180   Goals Addressed             This Visit's Progress    Patient Stated       Lose a little weight        Depression Screen    08/22/2022    9:08 AM 08/08/2021   11:37 AM 08/08/2021   11:35 AM 04/04/2021   10:17 AM 09/02/2020    8:16 AM 03/03/2020    9:09 AM 02/24/2019    9:36 AM  PHQ 2/9 Scores  PHQ - 2 Score 0 0 0 0 0 0 0  PHQ- 9 Score    2  0 2    Fall Risk    08/21/2022    6:38 PM 08/08/2021   11:37 AM 09/02/2020    8:16 AM 02/24/2019    9:38 AM 08/16/2015   10:24 AM  Fall Risk   Falls in the past year? 1 0 0 0 No  Number falls in past yr: 1 0 0 0   Injury with Fall? 0 0 0 0   Risk  for fall due to : Impaired vision      Follow up Falls prevention discussed Falls prevention discussed       FALL RISK PREVENTION PERTAINING TO THE HOME:  Any stairs in or around the home? Yes  If so, are there any without handrails? No  Home free of loose throw rugs in walkways, pet beds, electrical cords, etc? Yes  Adequate lighting in your home to reduce risk of falls? Yes   ASSISTIVE DEVICES UTILIZED TO PREVENT FALLS:  Life alert? No  Use of a cane, walker or w/c? No  Grab bars in the  bathroom? No  Shower chair or bench in shower? No  Elevated toilet seat or a handicapped toilet? No   TIMED UP AND GO:  Was the test performed? No .  Cognitive Function:        08/22/2022    9:10 AM 08/08/2021   11:39 AM  6CIT Screen  What Year? 0 points 0 points  What month? 0 points 0 points  What time? 0 points 0 points  Count back from 20 0 points 0 points  Months in reverse 0 points 0 points  Repeat phrase 0 points 0 points  Total Score 0 points 0 points    Immunizations Immunization History  Administered Date(s) Administered   Fluad Quad(high Dose 65+) 02/23/2022   Influenza Split 04/17/2011   Influenza Whole 01/02/2007, 01/07/2009, 02/04/2010, 01/06/2012   Influenza, High Dose Seasonal PF 01/08/2021   Influenza, Quadrivalent, Recombinant, Inj, Pf 01/04/2019   Influenza,inj,Quad PF,6+ Mos 03/14/2013, 02/03/2014, 01/06/2015, 11/28/2017, 03/03/2020   Influenza-Unspecified 12/25/2012, 01/28/2016, 01/13/2017   PFIZER Comirnaty(Gray Top)Covid-19 Tri-Sucrose Vaccine 10/04/2020   PFIZER(Purple Top)SARS-COV-2 Vaccination 05/30/2019, 06/30/2019   PNEUMOCOCCAL CONJUGATE-20 04/04/2021   Pfizer Covid-19 Vaccine Bivalent Booster 22yrs & up 02/23/2022   Pneumococcal Conjugate-13 07/15/2013   Pneumococcal Polysaccharide-23 05/11/2008, 08/19/2014   Tdap 07/15/2013   Zoster Recombinat (Shingrix) 02/24/2019, 08/26/2019    TDAP status: Up to date  Flu Vaccine status: Up to date  Pneumococcal vaccine status: Up to date  Covid-19 vaccine status: Completed vaccines  Qualifies for Shingles Vaccine? Yes   Zostavax completed Yes   Shingrix Completed?: Yes  Screening Tests Health Maintenance  Topic Date Due   COVID-19 Vaccine (5 - 2023-24 season) 04/20/2022   Hepatitis C Screening  03/24/2098 (Originally 08/07/1973)   INFLUENZA VACCINE  10/26/2022   HEMOGLOBIN A1C  02/18/2023   OPHTHALMOLOGY EXAM  03/26/2023   Diabetic kidney evaluation - eGFR measurement  04/08/2023    Diabetic kidney evaluation - Urine ACR  04/08/2023   FOOT EXAM  04/08/2023   DTaP/Tdap/Td (2 - Td or Tdap) 07/16/2023   Medicare Annual Wellness (AWV)  08/22/2023   Colonoscopy  04/28/2026   Pneumonia Vaccine 40+ Years old  Completed   Zoster Vaccines- Shingrix  Completed   HPV VACCINES  Aged Out    Health Maintenance  Health Maintenance Due  Topic Date Due   COVID-19 Vaccine (5 - 2023-24 season) 04/20/2022    Colorectal cancer screening: Type of screening: Colonoscopy. Completed 04/28/16. Repeat every 10 years   Additional Screening:  Hepatitis C Screening: does qualify  Vision Screening: Recommended annual ophthalmology exams for early detection of glaucoma and other disorders of the eye. Is the patient up to date with their annual eye exam?  Yes  Who is the provider or what is the name of the office in which the patient attends annual eye exams? Dr Lanny Cramp  If pt is not established with a  provider, would they like to be referred to a provider to establish care? No .   Dental Screening: Recommended annual dental exams for proper oral hygiene  Community Resource Referral / Chronic Care Management: CRR required this visit?  No   CCM required this visit?  No      Plan:     I have personally reviewed and noted the following in the patient's chart:   Medical and social history Use of alcohol, tobacco or illicit drugs  Current medications and supplements including opioid prescriptions. Patient is not currently taking opioid prescriptions. Functional ability and status Nutritional status Physical activity Advanced directives List of other physicians Hospitalizations, surgeries, and ER visits in previous 12 months Vitals Screenings to include cognitive, depression, and falls Referrals and appointments  In addition, I have reviewed and discussed with patient certain preventive protocols, quality metrics, and best practice recommendations. A written personalized  care plan for preventive services as well as general preventive health recommendations were provided to patient.     Marzella Schlein, LPN   1/61/0960   Nurse Notes: none

## 2022-08-23 ENCOUNTER — Telehealth: Payer: Self-pay | Admitting: Family Medicine

## 2022-08-23 NOTE — Telephone Encounter (Signed)
Patient states we should have copy of [facility] results [facility] [place], [place]. Pt is requesting copy of these results once we have received them.

## 2022-08-24 NOTE — Telephone Encounter (Signed)
Noted  

## 2022-09-04 ENCOUNTER — Encounter: Payer: Self-pay | Admitting: Cardiology

## 2022-09-04 ENCOUNTER — Ambulatory Visit: Payer: PPO | Attending: Cardiology | Admitting: Cardiology

## 2022-09-04 VITALS — BP 142/70 | HR 75 | Ht 70.0 in | Wt 234.4 lb

## 2022-09-04 DIAGNOSIS — G4733 Obstructive sleep apnea (adult) (pediatric): Secondary | ICD-10-CM

## 2022-09-04 DIAGNOSIS — E785 Hyperlipidemia, unspecified: Secondary | ICD-10-CM

## 2022-09-04 DIAGNOSIS — E1169 Type 2 diabetes mellitus with other specified complication: Secondary | ICD-10-CM | POA: Diagnosis not present

## 2022-09-04 DIAGNOSIS — I2510 Atherosclerotic heart disease of native coronary artery without angina pectoris: Secondary | ICD-10-CM | POA: Diagnosis not present

## 2022-09-04 DIAGNOSIS — E1159 Type 2 diabetes mellitus with other circulatory complications: Secondary | ICD-10-CM

## 2022-09-04 DIAGNOSIS — I10 Essential (primary) hypertension: Secondary | ICD-10-CM

## 2022-09-04 DIAGNOSIS — Z7984 Long term (current) use of oral hypoglycemic drugs: Secondary | ICD-10-CM | POA: Diagnosis not present

## 2022-09-04 NOTE — Progress Notes (Signed)
Cardiology Office Note:    Date:  09/04/2022   ID:  Zachary Warner, Zachary Warner 1955-07-28, MRN [id number]  PCP:  Shelva Majestic, MD  Cardiologist:  Gypsy Balsam, MD    Referring MD: Shelva Majestic, MD   Chief Complaint  Patient presents with   Follow-up    History of Present Illness:    Zachary Warner is a 67 y.o. male  with past medical history significant for coronary artery disease in 2016 he suffered from myocardial infarction that required PTCA and stenting of the obtuse marginal branch at that time he was found to have mild to moderate distal LAD. Additional problems include essential hypertension, longstanding diabetes, dyslipidemia.  Comes today to months for follow-up.  Overall doing very well.  He denies have any chest pain tightness squeezing pressure burning chest.  Play golf on the regular basis walk quite a distance does not have no difficulty doing it.  Past Medical History:  Diagnosis Date   Allergy    Diabetes mellitus    Hyperlipidemia    Hypertension    Myocardial infarction [facility])    May 2016   Nephrolithiasis    3x 2007-2015, passed on his own   Sleep apnea    wears CPAP    Past Surgical History:  Procedure Laterality Date   CARDIAC CATHETERIZATION     stent May 2016   COLONOSCOPY     heart stent     May 2016   HERNIA REPAIR      Current Medications: Current Meds  Medication Sig   aspirin 81 MG tablet Take 81 mg by mouth daily.   atorvastatin (LIPITOR) 40 MG tablet Take 1 tablet (40 mg total) by mouth daily.   carvedilol (COREG) 3.125 MG tablet Take 1 tablet (3.125 mg total) by mouth 2 times daily. (Patient taking differently: Take 3.125 mg by mouth 2 (two) times daily with a meal.)   EQ FIBER SUPPLEMENT PO Take 1 tablet by mouth daily. Unknown strength   fluticasone (FLONASE) 50 MCG/ACT nasal spray Place 1 spray into both nostrils daily as needed for allergies or rhinitis.   insulin lispro (HUMALOG KWIKPEN) 100 UNIT/ML KwikPen INJECT 25-35  UNITS SUBCUTANEOUSLY THREE TIMES DAILY (Patient taking differently: Inject 25-35 Units into the skin 3 (three) times daily. INJECT 25-35 UNITS SUBCUTANEOUSLY THREE TIMES DAILY)   JARDIANCE 25 MG TABS tablet TAKE ONE TABLET (25mg ) BY MOUTH DAILY (Patient taking differently: Take 25 mg by mouth daily.)   lisinopril (ZESTRIL) 40 MG tablet Take 1 tablet by mouth daily.   metFORMIN (GLUCOPHAGE) 1000 MG tablet Take 1 tablet (1,000 mg total) by mouth 2 (two) times daily with a meal.   nitroGLYCERIN (NITROSTAT) 0.4 MG SL tablet PLACE 1 TABLET UNDER TONGUE EVERY 5 MINUTES AS NEEDED FOR CHEST PAIN. CALL 911/DOCTOR IF YOU TAKE 2 TABLETS. max of 3 tablets/day. (Patient taking differently: Place 0.4 mg under the tongue every 5 (five) minutes as needed for chest pain.)   Omega-3 Fatty Acids (FISH OIL) 1000 MG CAPS Take 4 capsules by mouth daily.   OZEMPIC, 1 MG/DOSE, 4 MG/3ML SOPN Inject 1 mg into the skin once a week.   TOUJEO MAX SOLOSTAR 300 UNIT/ML Solostar Pen Inject 80 Units into the skin daily.   vitamin B-12 (CYANOCOBALAMIN) 500 MCG tablet Take 500 mcg by mouth daily.     Allergies:   Invokana [canagliflozin]   Social History   Socioeconomic History   Marital status: Married    Spouse name: Not  on file   Number of children: Not on file   Years of education: Not on file   Highest education level: Not on file  Occupational History   Not on file  Tobacco Use   Smoking status: Never   Smokeless tobacco: Never  Substance and Sexual Activity   Alcohol use: No   Drug use: No   Sexual activity: Never  Other Topics Concern   Not on file  Social History Narrative   Married. 2 children son and daughter. Granddaughter 12/02/2014 born and grandson 7/17//2018.       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      Hobbies: golfing (low to mid 59s)   Social Determinants of Corporate investment banker Strain: Low Risk  (08/21/2022)   Overall Financial Resource  Strain (CARDIA)    Difficulty of Paying Living Expenses: Not very hard  Food Insecurity: No Food Insecurity (08/21/2022)   Hunger Vital Sign    Worried About Running Out of Food in the Last Year: Never true    Ran Out of Food in the Last Year: Never true  Transportation Needs: No Transportation Needs (08/21/2022)   PRAPARE - Administrator, Civil Service (Medical): No    Lack of Transportation (Non-Medical): No  Physical Activity: Sufficiently Active (08/21/2022)   Exercise Vital Sign    Days of Exercise per Week: 3 days    Minutes of Exercise per Session: 60 min  Stress: No Stress Concern Present (08/21/2022)   [employer] of Occupational Health - Occupational Stress Questionnaire    Feeling of Stress : Not at all  Social Connections: Socially Integrated (08/21/2022)   Social Connection and Isolation Panel [NHANES]    Frequency of Communication with Friends and Family: Once a week    Frequency of Social Gatherings with Friends and Family: Twice a week    Attends Religious Services: More than 4 times per year    Active Member of [employer] or Organizations: Yes    Attends Banker Meetings: 1 to 4 times per year    Marital Status: Married     Family History: The patient's family history includes Coronary artery disease in his father; Diabetes in his sister and sister; Kidney disease in his sister; Liver disease in his father; Other in his sister. There is no history of Colon cancer, Esophageal cancer, Rectal cancer, or Stomach cancer. ROS:   Please see the history of present illness.    All 14 point review of systems negative except as described per history of present illness  EKGs/Labs/Other Studies Reviewed:      Recent Labs: 12/14/2021: TSH 1.61 04/07/2022: ALT 15; BUN 19; Creatinine, Ser 0.97; Hemoglobin 14.5; Platelets 210.0; Potassium 4.3; Sodium 140  Recent Lipid Panel    Component Value Date/Time   CHOL 128 12/14/2021 0922   CHOL 114 12/03/2020  0922   TRIG 194.0 (H) 12/14/2021 0922   TRIG 176 (H) 01/29/2006 1110   HDL 33.40 (L) 12/14/2021 0922   HDL 29 (L) 12/03/2020 0922   CHOLHDL 4 12/14/2021 0922   VLDL 38.8 12/14/2021 0922   LDLCALC 56 12/14/2021 0922   LDLCALC 59 12/03/2020 0922   LDLCALC 51 03/03/2020 0952   LDLDIRECT 82.0 04/04/2021 1015    Physical Exam:    VS:  BP (!) 142/70 (BP Location: Left Arm, Patient Position: Sitting)   Pulse 75   Ht 5\' 10"  (1.778 m)   Wt  234 lb 6.4 oz (106.3 kg)   SpO2 94%   BMI 33.63 kg/m     Wt Readings from Last 3 Encounters:  09/04/22 234 lb 6.4 oz (106.3 kg)  08/22/22 235 lb (106.6 kg)  08/18/22 235 lb 9.6 oz (106.9 kg)     GEN:  Well nourished, well developed in no acute distress HEENT: Normal NECK: No JVD; No carotid bruits LYMPHATICS: No lymphadenopathy CARDIAC: RRR, no murmurs, no rubs, no gallops RESPIRATORY:  Clear to auscultation without rales, wheezing or rhonchi  ABDOMEN: Soft, non-tender, non-distended MUSCULOSKELETAL:  No edema; No deformity  SKIN: Warm and dry LOWER EXTREMITIES: no swelling NEUROLOGIC:  Alert and oriented x 3 PSYCHIATRIC:  Normal affect   ASSESSMENT:    1. Coronary artery disease involving native coronary artery of native heart without angina pectoris   2. Essential hypertension   3. OSA on CPAP   4. Type 2 diabetes mellitus with other circulatory complication, without long-term current use of insulin (HCC)   5. Hyperlipidemia associated with type 2 diabetes mellitus (HCC)    PLAN:    In order of problems listed above:  Coronary artery disease stable from that point review continue antiplatelet therapy. Essential hypertension, slightly elevated in the office today but he said every single time he checked at home at different doctors offices is normal so we will recheck his blood pressure before he leaves the room. Dyslipidemia I did review K PN which show me LDL 56 HDL at 33.4.  Will continue present management. Obstructive sleep  Apnea.  He uses CPAP mask on the regular basis which I recommend to continue. Diabetes followed by primary care physician I do have his hemoglobin A1c that I reviewed 6.9 this is from 11/18/2022.  Will continue present management. Last time we get concerned about Q waves will sign on the EKG however echocardiogram did not show any segmental wall motion abnormalities   Medication Adjustments/Labs and Tests Ordered: Current medicines are reviewed at length with the patient today.  Concerns regarding medicines are outlined above.  No orders of the defined types were placed in this encounter.  Medication changes: No orders of the defined types were placed in this encounter.   Signed, Georgeanna Lea, MD, [facility] 09/04/2022 9:48 AM    [facility]

## 2022-09-04 NOTE — Patient Instructions (Signed)

## 2022-09-05 NOTE — Telephone Encounter (Signed)
Pt called for an update. Informed him it was received and ready for pick up.   Results have been printed and placed in pick up folder.

## 2022-09-11 ENCOUNTER — Other Ambulatory Visit: Payer: Self-pay | Admitting: Family Medicine

## 2022-09-11 ENCOUNTER — Other Ambulatory Visit: Payer: Self-pay | Admitting: Internal Medicine

## 2022-09-25 ENCOUNTER — Other Ambulatory Visit: Payer: Self-pay | Admitting: Internal Medicine

## 2022-09-25 DIAGNOSIS — E1159 Type 2 diabetes mellitus with other circulatory complications: Secondary | ICD-10-CM

## 2022-10-12 ENCOUNTER — Encounter: Payer: Self-pay | Admitting: Family Medicine

## 2022-10-12 ENCOUNTER — Ambulatory Visit (INDEPENDENT_AMBULATORY_CARE_PROVIDER_SITE_OTHER): Payer: PPO | Admitting: Family Medicine

## 2022-10-12 VITALS — BP 118/60 | HR 81 | Temp 98.0°F | Ht 70.0 in | Wt 234.0 lb

## 2022-10-12 DIAGNOSIS — E1169 Type 2 diabetes mellitus with other specified complication: Secondary | ICD-10-CM

## 2022-10-12 DIAGNOSIS — Z794 Long term (current) use of insulin: Secondary | ICD-10-CM | POA: Diagnosis not present

## 2022-10-12 DIAGNOSIS — I10 Essential (primary) hypertension: Secondary | ICD-10-CM

## 2022-10-12 DIAGNOSIS — E785 Hyperlipidemia, unspecified: Secondary | ICD-10-CM

## 2022-10-12 DIAGNOSIS — Z7984 Long term (current) use of oral hypoglycemic drugs: Secondary | ICD-10-CM | POA: Diagnosis not present

## 2022-10-12 DIAGNOSIS — Z7985 Long-term (current) use of injectable non-insulin antidiabetic drugs: Secondary | ICD-10-CM

## 2022-10-12 DIAGNOSIS — E1159 Type 2 diabetes mellitus with other circulatory complications: Secondary | ICD-10-CM

## 2022-10-12 DIAGNOSIS — I2510 Atherosclerotic heart disease of native coronary artery without angina pectoris: Secondary | ICD-10-CM

## 2022-10-12 LAB — CBC WITH DIFFERENTIAL/PLATELET
Basophils Absolute: 0 10*3/uL (ref 0.0–0.1)
Basophils Relative: 0.4 % (ref 0.0–3.0)
Eosinophils Absolute: 0.2 10*3/uL (ref 0.0–0.7)
Eosinophils Relative: 2.6 % (ref 0.0–5.0)
HCT: 44.4 % (ref 39.0–52.0)
Hemoglobin: 14.4 g/dL (ref 13.0–17.0)
Lymphocytes Relative: 34.6 % (ref 12.0–46.0)
Lymphs Abs: 2.3 10*3/uL (ref 0.7–4.0)
MCHC: 32.4 g/dL (ref 30.0–36.0)
MCV: 87.1 fl (ref 78.0–100.0)
Monocytes Absolute: 0.5 10*3/uL (ref 0.1–1.0)
Monocytes Relative: 7 % (ref 3.0–12.0)
Neutro Abs: 3.6 10*3/uL (ref 1.4–7.7)
Neutrophils Relative %: 55.4 % (ref 43.0–77.0)
Platelets: 201 10*3/uL (ref 150.0–400.0)
RBC: 5.1 Mil/uL (ref 4.22–5.81)
RDW: 14.7 % (ref 11.5–15.5)
WBC: 6.5 10*3/uL (ref 4.0–10.5)

## 2022-10-12 LAB — COMPREHENSIVE METABOLIC PANEL
ALT: 18 U/L (ref 0–53)
AST: 15 U/L (ref 0–37)
Albumin: 4.3 g/dL (ref 3.5–5.2)
Alkaline Phosphatase: 52 U/L (ref 39–117)
BUN: 23 mg/dL (ref 6–23)
CO2: 28 mEq/L (ref 19–32)
Calcium: 9.8 mg/dL (ref 8.4–10.5)
Chloride: 102 mEq/L (ref 96–112)
Creatinine, Ser: 1.06 mg/dL (ref 0.40–1.50)
GFR: 72.79 mL/min (ref >60.00)
Glucose, Bld: 84 mg/dL (ref 70–99)
Potassium: 4.4 mEq/L (ref 3.5–5.1)
Sodium: 138 mEq/L (ref 135–145)
Total Bilirubin: 0.9 mg/dL (ref 0.2–1.2)
Total Protein: 7.2 g/dL (ref 6.0–8.3)

## 2022-10-12 LAB — LDL CHOLESTEROL, DIRECT: Direct LDL: 52 mg/dL

## 2022-10-12 LAB — TSH: TSH: 1.29 u[IU]/mL (ref 0.35–5.50)

## 2022-10-12 NOTE — Patient Instructions (Addendum)
Sign release of information at the check out desk for Diabetic Eye Exam.  Let us know if you get any COVID vaccines this fall.  Please stop by lab before you go If you have mychart- we will send your results within 3 business days of [place] receiving them.  If you do not have mychart- we will call you about results within 5 business days of [place] receiving them.  *please also note that you will see labs on mychart as soon as they post. I will later go in and write notes on them- will say "notes from Dr. Durene Cal"   Recommended follow up: Return in about 6 months (around 04/14/2023) for physical or sooner if needed.Schedule b4 you leave.

## 2022-10-12 NOTE — Progress Notes (Signed)
Phone [phone] In person visit   Subjective:   Zachary Warner is a 67 y.o. year old very pleasant male patient who presents for/with See problem oriented charting Chief Complaint  Patient presents with   Medical Management of Chronic Issues   Hyperlipidemia   Hypertension   Diabetes    Sign ROI   Past Medical History-  Patient Active Problem List   Diagnosis Date Noted   Diabetic retinopathy (HCC) 02/12/2017    Priority: High   CAD (coronary artery disease) status post PTCA and stent to obtuse marginal branch in 2016 08/18/2014    Priority: High   DM (diabetes mellitus) (HCC) 02/04/2006    Priority: High   Multiple thyroid nodules 03/31/2020    Priority: Medium    OSA on CPAP 08/26/2019    Priority: Medium    GERD (gastroesophageal reflux disease) 08/19/2014    Priority: Medium    Hyperlipidemia associated with type 2 diabetes mellitus (HCC) 02/04/2006    Priority: Medium    Essential hypertension 02/04/2006    Priority: Medium    Allergy 12/02/2020    Priority: Low   Old MI (myocardial infarction) 01/29/2017    Priority: Low   Nephrolithiasis     Priority: Low   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     atorvastatin (LIPITOR) 40 MG tablet Take 1 tablet (40 mg total) by mouth daily. 90 tablet 3   carvedilol (COREG) 3.125 MG tablet Take 1 tablet (3.125 mg total) by mouth 2 times daily. 60 tablet 3   EQ FIBER SUPPLEMENT PO Take 1 tablet by mouth daily. Unknown strength     fluticasone (FLONASE) 50 MCG/ACT nasal spray Place 1 spray into both nostrils daily as needed for allergies or rhinitis.     insulin lispro (HUMALOG KWIKPEN) 100 UNIT/ML KwikPen INJECT 25-35 UNITS SUBCUTANEOUSLY THREE TIMES DAILY 45 mL 0   JARDIANCE 25 MG TABS tablet TAKE ONE TABLET (25mg ) BY  MOUTH DAILY (Patient taking differently: Take 25 mg by mouth daily.) 90 tablet 5   lisinopril (ZESTRIL) 40 MG tablet Take 1 tablet by mouth daily. 30 tablet 3   metFORMIN (GLUCOPHAGE) 1000 MG tablet Take 1 tablet (1,000 mg total) by mouth 2 (two) times daily with a meal. 180 tablet 3   nitroGLYCERIN (NITROSTAT) 0.4 MG SL tablet PLACE 1 TABLET UNDER TONGUE EVERY 5 MINUTES AS NEEDED FOR CHEST PAIN. CALL 911/DOCTOR IF YOU TAKE 2 TABLETS. max of 3 tablets/day. (Patient taking differently: Place 0.4 mg under the tongue every 5 (five) minutes as needed for chest pain.) 25 tablet 3   Omega-3 Fatty Acids (FISH OIL) 1000 MG CAPS Take 4 capsules by mouth daily.     OZEMPIC, 1 MG/DOSE, 4 MG/3ML SOPN Inject 1 mg into the skin once a week. 9 mL 3   TOUJEO MAX SOLOSTAR 300 UNIT/ML Solostar Pen Inject 80 Units into the skin daily. 24 mL 3   vitamin B-12 (CYANOCOBALAMIN) 500 MCG tablet Take 500 mcg by mouth daily.     No current facility-administered medications for this visit.     Objective:  BP 118/60   Pulse 81   Temp 98 F (36.7 C)   Ht 5\' 10"  (1.778 m)   Wt 234 lb (106.1 kg)   SpO2  98%   BMI 33.58 kg/m  Gen: NAD, resting comfortably CV: RRR no murmurs rubs or gallops Lungs: CTAB no crackles, wheeze, rhonchi Ext: trace edema Skin: warm, dry    Assessment and Plan   %# CAD- follows with with Dr. Bing Matter with [facility] #hyperlipidemia S:compliant with aspirin and atorvastatin 40 mg  (2 in a row then day off and repeats) and omega-3 fatty acids Symptoms- no chest pain or shortness of breath  -1 stent 2015 Lab Results  Component Value Date   CHOL 128 12/14/2021   HDL 33.40 (L) 12/14/2021   LDLCALC 56 12/14/2021   LDLDIRECT 82.0 04/04/2021   TRIG 194.0 (H) 12/14/2021   CHOLHDL 4 12/14/2021  A/P: CAD asymptomatic - continue current medications Cholesterol has been at goal with LDL at 56 on last check- will check direct LDL  #hypertension S: compliant with coreg 3.125mg  BID, lisinopril 40mg    BP Readings from Last 3 Encounters:  10/12/22 118/60  09/04/22 (!) 142/70  08/18/22 120/72  A/P: stable- continue current medicines   %# Diabetes with history of diabetic retinopathy S: followed closely by Dr. Elvera Lennox and on both long (toujeo ) and short acting insulin- lispro 25-35 units TID, Ozempic 1 mg, metformin 1000 mg twice daily (takes B12 for this), Jardiance 25 mg.   -Fructosamine more relevant for him- we do a1c when we see him primarily for metrics.   Has history retinopathy- reports good report lately- request records Lab Results  Component Value Date   HGBA1C 6.9 (A) 08/18/2022   HGBA1C 7.2 (H) 04/07/2022   HGBA1C 6.8 (A) 12/14/2021   A/P: diabetes well controlled continue current medications and endocrinology follow up   # Multiple thyroid nodules-I am in [place] FN 02/12/2019 benign with Dr. Elvera Lennox. Stable ultrasound on 05/08/22 Lab Results  Component Value Date   TSH 1.61 12/14/2021   #Fatty liver- incidental finding when evaluating RUQ pain-monitor LFTs Lab Results  Component Value Date   ALT 15 04/07/2022   AST 13 04/07/2022   ALKPHOS 63 04/07/2022   BILITOT 0.6 04/07/2022   #Failed hearing test- considering hearing aids in 2024- may get through [facility]   Recommended follow up: Return in about 6 months (around 04/14/2023) for physical or sooner if needed.Schedule b4 you leave. Future Appointments  Date Time Provider Department Center  12/22/2022  8:40 AM Carlus Pavlov, MD [facility] None  08/28/2023  8:45 AM [facility] ANNUAL WELLNESS VISIT 1 [facility]   Lab/Order associations:   ICD-10-CM   1. Coronary artery disease involving native coronary artery of native heart without angina pectoris  I25.10     2. Type 2 diabetes mellitus with other circulatory complication, without long-term current use of insulin (HCC)  E11.59     3. Essential hypertension  I10 Comprehensive metabolic panel    CBC with Differential/Platelet    4. Hyperlipidemia associated with  type 2 diabetes mellitus (HCC)  E11.69 Comprehensive metabolic panel   N82.9 CBC with Differential/Platelet    TSH    Direct LDL      No orders of the defined types were placed in this encounter.   Return precautions advised.  [name], MD

## 2022-10-24 ENCOUNTER — Other Ambulatory Visit: Payer: Self-pay | Admitting: Family Medicine

## 2022-10-24 ENCOUNTER — Other Ambulatory Visit: Payer: Self-pay | Admitting: Internal Medicine

## 2022-10-24 DIAGNOSIS — Z794 Long term (current) use of insulin: Secondary | ICD-10-CM

## 2022-11-21 ENCOUNTER — Telehealth: Payer: Self-pay | Admitting: Pharmacist

## 2022-11-21 ENCOUNTER — Encounter: Payer: Self-pay | Admitting: Pharmacist

## 2022-11-21 MED ORDER — LISINOPRIL 40 MG PO TABS: 40.0000 mg | ORAL_TABLET | Freq: Every day | ORAL | 3 refills | Status: DC

## 2022-11-21 NOTE — Telephone Encounter (Signed)
-----   Message from [name] sent at 11/21/2022  9:16 AM EDT ----- Yes that is fine-I am okay with 3 refills-he is generally good about coming in for his visits-thanks for all your hard work! ----- Message ----- From: Henrene Pastor, RPH-CPP Sent: 11/21/2022   9:14 AM EDT To: Shelva Majestic, MD  Patient was on insurance list as due to refill lisinopril and atorvastatin - he has actually filled both around 10/25/2022.  Lisinopril Rx is only for 30 Ds but atorvastatin is for 90 DS. He will see you next 03/2023 - would it be OK to send in Rx for 90 DS for lisinopril with 1 refill?

## 2022-11-21 NOTE — Telephone Encounter (Signed)
Prescription for lisinopril 40mg  - sent to [facility] for 90 DS / 3 RF

## 2022-11-21 NOTE — Progress Notes (Signed)
   11/21/2022  Patient ID: Zachary Warner, male   DOB: Jan 22, 1956, 67 y.o.   MRN: [id number]  Pharmacy Quality Measure Review  This patient is appearing on a report for being at risk of failing the adherence measure for cholesterol (statin) and hypertension (ACEi/ARB) medications this calendar year.   Medication: atorvastatin 40mg  Last fill date: 06/28/2022 for 30 day supply  Medication: lisinopril 40mg  Last fill date: 06/28/2022 for 30 day supply  Reviewed records in Dr [employer]. Acutually atorvastatin was filled for 30 DS on 09/25/2022 and 90 DS on 10/24/2022 at [facility] Lisinopril was filled for 30 DS 09/25/2022 and 10/25/2022 at [facility].   Insurance report was not up to date.  Since patient only has 1 refill left on lisinopril prescription will ask PCP about changing to 90 day supply. Next appt with PCP in January 2025.   Henrene Pastor, PharmD Clinical Pharmacist [facility]  Population Health [phone]

## 2022-12-22 ENCOUNTER — Ambulatory Visit: Payer: PPO | Admitting: Internal Medicine

## 2022-12-22 ENCOUNTER — Encounter: Payer: Self-pay | Admitting: Internal Medicine

## 2022-12-22 VITALS — BP 138/80 | HR 83 | Resp 18 | Ht 70.0 in | Wt 233.6 lb

## 2022-12-22 DIAGNOSIS — E785 Hyperlipidemia, unspecified: Secondary | ICD-10-CM | POA: Diagnosis not present

## 2022-12-22 DIAGNOSIS — E1159 Type 2 diabetes mellitus with other circulatory complications: Secondary | ICD-10-CM

## 2022-12-22 DIAGNOSIS — E669 Obesity, unspecified: Secondary | ICD-10-CM | POA: Diagnosis not present

## 2022-12-22 DIAGNOSIS — Z794 Long term (current) use of insulin: Secondary | ICD-10-CM | POA: Diagnosis not present

## 2022-12-22 DIAGNOSIS — E042 Nontoxic multinodular goiter: Secondary | ICD-10-CM | POA: Diagnosis not present

## 2022-12-22 LAB — POCT GLYCOSYLATED HEMOGLOBIN (HGB A1C): Hemoglobin A1C: 6.5 % — AB (ref 4.0–5.6)

## 2022-12-22 LAB — MICROALBUMIN / CREATININE URINE RATIO
Creatinine,U: 107.9 mg/dL
Microalb Creat Ratio: 0.8 mg/g (ref 0.0–30.0)
Microalb, Ur: 0.9 mg/dL (ref 0.0–1.9)

## 2022-12-22 LAB — LIPID PANEL
Cholesterol: 121 mg/dL (ref 0–200)
HDL: 33.1 mg/dL — ABNORMAL LOW (ref >39.00)
LDL Cholesterol: 56 mg/dL (ref 0–99)
NonHDL: 87.58
Total CHOL/HDL Ratio: 4
Triglycerides: 158 mg/dL — ABNORMAL HIGH (ref 0.0–149.0)
VLDL: 31.6 mg/dL (ref 0.0–40.0)

## 2022-12-22 NOTE — Patient Instructions (Addendum)
Please continue: - Metformin 1000 mg 2x a day. - Jardiance 25 mg daily before breakfast  - Toujeo 80 units daily - Ozempic 1 mg weekly - Humalog: - 25 units before b'fast - 20-25 units before lunch - 20-25 units before dinner  Please return in 4 months with your sugar log.

## 2022-12-22 NOTE — Progress Notes (Signed)
Patient ID: Zachary Warner, male   DOB: 1955-08-06, 67 y.o.   MRN: [id number]  HPI: Zachary Warner is a 67 y.o.-year-old male, presenting for f/u for DM2, dx in ~1995, insulin-dependent since ~2005, uncontrolled, with complications (CAD - s/p AMI, DR). Last visit 4 months ago.  Interim history: No increased urination, blurry vision, nausea, chest pain.   He failed the hearing test-cannot hear high-pitched voices.  He has new hearing aids but he is not very happy with them. He is preparing to go on a [place] cruise starting in [place] city.  DM2:  Reviewed HbA1c levels: Lab Results  Component Value Date   HGBA1C 6.9 (A) 08/18/2022   HGBA1C 7.2 (H) 04/07/2022   HGBA1C 6.8 (A) 12/14/2021  07/22/2020: HbA1c calculated from fructosamine is 6.16%, excellent. 03/31/2020: HbA1c calculated from fructosamine is at goal, at 6.1%. 11/25/2019: HbA1c calculated from fructosamine is 6.2% 01/10/2019: HbA1c calculated from fructosamine 6.35% 04/09/2017: HbA1c calculated from fructosamine is 6.8% 11/28/2017:  Hba1c calculated from fructosamine is better: 6.45% 07/11/2017: HbA1c calculated from the fructosamine is 6.6%. 03/08/2017: HbA1c calculated from the fructosamine is 6.5%. 11/06/2016: HbA1c calculated from the fructosamine is slightly higher, at 6.6% 07/05/2016: HbA1c calculated from the fructosamine is 6.2% 04/05/2016: HbA1c calculated from fructosamine is excellent, at 6% 01/04/2016: HbA1c calculated from fructosamine is  6.55%. 11/10/2015: HbA1c calculated from fructosamine is 5.8%. 08/10/2015: HbA1c calculated from fructosamine is 7.05%. 04/09/2015: HbA1c calculated fructosamine (326): 7.1%, which is much more concordant with his sugar log.   He is on: - Metformin 1000 mg 2x a day. - Jardiance 25 mg daily before breakfast - Ozempic 0.5 mg weekly-added 07/2019 >> 1 mg weekly - Tresiba U200  >> Toujeo 80 units daily - Humalog: 25-30-40 units before meals >> 25-30-35 (36-38) units before meals  >> - 25 units before b'fast - 25-28 units before lunch - 30-32 units before dinner We stopped Januvia 2/2 large doses of mealtime insulin. He initially had yeast infections with Jardiance, but now controlled with Azo.  He checks his sugars 1x a day: - am: 89-149, 162 >> 105-120 >> 83-112 >> 104-146 >> 81-127, 139 - 2h after b'fast: 244 x1 >> n/c >> 148 >> n/c >> 103-179 >> 165 - lunch:  130-177 >> 138, 147 >> n/c >> 67 >> n/c - 2h after lunch: 124, 142, 260 >> 106, 120, 222, 266 (no insulin) >> n/c - dinner: 147 >> 125 >> n/c >> 120-139 >> 72, 130, 147 >> 89 - 2h after dinner: 148-189, 300 >> 200 >> 160, 163 >> n/c - bedtime: 169, 194 >> see below >> 72-164 >> 67, 72 >> n/c - nighttime: 73, 109 >> n/c >> 67 x2, 71, 79, 143 >> 56, 63, 70, 74, 83 Low sugar: 50 - at night ... >> 70 >> 67 >> 56x 1, 63; has hypoglycemia awareness in the 60s. Highest sugar was 368 ... >> 260 (OJ) >> 222 >> 169  He has a ReliOn meter.  Pt's meals are: - Breakfast: grits or sandwich - Lunch: sandwich - Dinner: chicken or beef or pork + vegetables - Snacks: 1 or 2 snacks  - crackers, peanuts or cookies  No CKD, last BUN/creatinine:  Lab Results  Component Value Date   BUN 23 10/12/2022   CREATININE 1.06 10/12/2022   Lab Results  Component Value Date   MICRALBCREAT 1.0 04/07/2022   MICRALBCREAT 0.8 02/04/2016   MICRALBCREAT 0.6 07/08/2013   MICRALBCREAT 0.6 03/07/2013   MICRALBCREAT 13.7 05/29/2006  On  lisinopril.  + HL; last set of lipids: Lab Results  Component Value Date   CHOL 128 12/14/2021   HDL 33.40 (L) 12/14/2021   LDLCALC 56 12/14/2021   LDLDIRECT 52.0 10/12/2022   TRIG 194.0 (H) 12/14/2021   CHOLHDL 4 12/14/2021  On Lipitor 40 mg every day and fish oil  1000 mg 1x a day.  - Latest eye exam: 06/2022: No DR reportedly, prev.+ DR. He sees Dr. Maude Leriche.  - no numbness and tingling in his feet. Last foot exam: 03/2022.  He also has a history of HTN, h/o nephrolithiasis x 3,  last in 03/2013, OSA.   He had an MI 07/2014 >> had a stent placed. Cardiologist: Dr Sedonia Small. He is on supplementation with B12. He had heel pain, improved after starting to use special socks and using a TENS unit-Plantar fasciitis.  He is retired and works only 3 days a week (10 am to 4 pm).  He loves it.  Thyroid nodules:  Thyroid U/S (01/22/2019): Several nodules, of which 2 were dominant: Parenchymal Echotexture: Moderately heterogenous Isthmus: 1.0 cm Right lobe: 5.4 x 2.0 x 1.8 cm Left lobe: 4.7 x 2.4 x 2.0 cm _____________________________________   Estimated total number of nodules >/= 1 cm: 5 _____________________________________   Nodule # 1: Location: Isthmus; Mid Maximum size: 1.9 cm; Other 2 dimensions: 1.3 x 1.7 cm Composition: cannot determine (2) Echogenicity: hypoechoic (2) Echogenic foci: macrocalcifications (1) ACR TI-RADS total points: 5.  **Given size (>/= 1.5 cm) and appearance, fine needle aspiration of this moderately suspicious nodule should be considered based on TI-RADS criteria. _________________________________________________________   Nodule # 2: Location: Right; Superior Maximum size: 1.0 cm; Other 2 dimensions: 0.6 x 0.7 cm Composition: solid/almost completely solid (2) Echogenicity: hypoechoic (2) Echogenic foci: punctate echogenic foci (3) ACR TI-RADS total points: 7.  **Given size (>/= 1.0 cm) and appearance, fine needle aspiration of this highly suspicious nodule should be considered based on TI-RADS criteria. _________________________________________________________   Nodule #3 is a predominantly cystic nodule the mid right thyroid lobe that measures 1.1 x 0.8 x 0.8 cm.   Nodule #4 is a cystic nodule in the superior left thyroid lobe that measures 1.5 x 0.9 x 1.0 cm.   Nodule #5 is a mildly complex cystic nodule with septations in the mid left thyroid lobe that measures 1.2 x 1.0 x 1.3 cm.   Nodule # 6: Location: Left;  Mid Maximum size: 0.8 cm; Other 2 dimensions: 0.6 x 0.5 cm Composition: mixed cystic and solid (1) Echogenicity: isoechoic (1) Shape: taller-than-wide (3) ACR TI-RADS total points: 5. Given size (<0.9 cm) and appearance, this nodule does NOT meet TI-RADS criteria for biopsy or dedicated follow-up. _______________________________________   IMPRESSION: 1. Multinodular goiter. 2. Nodule #1 in the isthmus and nodule #2 in the superior right thyroid lobe both meet criteria for ultrasound-guided biopsy. 3. Multiple cystic nodules that do not meet criteria for biopsy.   Biopsies of the dominant nodules (02/13/2019): Benign:  Clinical History: Nodule 1 Isthmus Mid 1.9 cm; Other 2 dimensions: 1.3 x  1.7 cm, Hypoechoic, ACR TI-RADS total points: 5, Moderately suspicious  nodule  Specimen Submitted:  A. THYROID ISTHMUS, FINE NEEDLE ASPIRATION:   FINAL MICROSCOPIC DIAGNOSIS:  - Consistent with benign follicular nodule (Bethesda category II)   SPECIMEN ADEQUACY:  Satisfactory for evaluation   CYTOLOGY - NON PAP  CASE: [id number]  PATIENT: Zachary Warner  Non-Gynecological Cytology Report   Clinical History: Nodule 2 Right Superior 1.0 cm; Other 2 dimensions:  0.6 x 0.7 cm, Solid almost completely solid, Hypoechoic, ACR TI-RADS  total points: 7, Highly suspicious  nodule  Specimen Submitted:  A. THYROID, RT LOBE RUP, FINE NEEDLE ASPIRATION:   FINAL MICROSCOPIC DIAGNOSIS:  - Consistent with benign follicular nodule (Bethesda category II)   SPECIMEN ADEQUACY:  Satisfactory for evaluation  Thyroid ultrasound (04/20/2020):  Parenchymal Echotexture: Moderately heterogeneous Isthmus: 0.8 cm Right lobe: 4.4 x 2.0 x 1.6 cm Left lobe: 4.8 x 2.0 x 1.8 cm _________________________________________________________  Nodule 1: 1.1 x 1.1 x 0.7 cm nodule located in the isthmus has decreased in size since prior examination where it measured 1.9 x 1.7 x 1.3 cm. FNA of this nodule was performed  on 02/12/2019. Please correlate with results.  Nodule 2: 1.0 x 0.9 x 0.6 cm nodule located in the superior right thyroid lobe is not significantly changed in size measuring 1.0 x 0.7 x 0.6 cm on the prior exam. FNA of this nodule was performed on 02/12/2019. Please correlate with results.  Nodule 3: 1.2 x 0.9 x 0.7 cm cystic nodule located in the mid right thyroid lobe is not significantly changed in size since prior exam where it measured 1.1 x 0.8 x 0.8 cm. It does not meet criteria for FNA or imaging follow-up.  Nodule 4: 0.6 x 0.6 x 0.3 cm solid hypoechoic nodule in the inferior right thyroid lobe is new since the prior examination. It does not meet criteria for FNA or imaging follow-up.  Nodule 5: 1.6 x 0.9 x 0.8 cm cystic nodule in the superior left thyroid lobe is not significantly changed in size since prior examination when where measured 1.5 x 1.0 x 0.9 cm. This does not meet criteria for FNA or imaging follow-up.  Nodule 6: 0.8 x 0.7 x 0.6 cm solid isoechoic nodule in the mid left thyroid lobe previously measured 1.2 x 1.0 x 1.3 cm. Interval decrease in size favors a benign etiology. This nodule does not meet criteria for imaging follow-up or FNA. _________________________________________________________  IMPRESSION: Bilateral thyroid nodules. Nodules 1 and 2 were previously biopsied. Please correlate with results. Remaining nodules do not meet criteria for FNA or imaging follow-up.  Thyroid U/S (05/08/2022): Parenchymal Echotexture: Moderately heterogenous  Isthmus: 0.9 cm, previously 0.8 cm  Right lobe: 4.7 x 1.8 x 1.5 cm, previously 4.4 x 2.0 x 1.6 cm  Left lobe: 4.3 x 1.9 x 1.9 cm, previously 4.8 x 2.0 x 1.8 cm  _________________________________________________________   Estimated total number of nodules >/= 1 cm: 2 _________________________________________________________   Nodule #1 in the isthmus is heterogeneous but hypoechoic. Nodule measures 1.1 x 0.9 x  1.3 cm and previously measured 1.1 x 0.7 x 1.1 cm. Morphology of the nodule is not significantly changed. This represents the previously biopsied nodule.   Nodul #2 in the superior right thyroid lobe is a hypoechoic nodule with at least 1 small echogenic focus. This represents the previously biopsied nodule and measures 1.1 x 0.6 x 0.8 cm and previously measured 1.0 x 0.6 x 0.9 cm. Morphology of the nodule is stable.   Multiple bilateral thyroid nodules.   Nodule #5 in the left mid thyroid lobe is a predominantly isoechoic solid nodule with small cystic component. This nodule measures 0.9 x 0.6 x 0.7 cm. This is compatible with a TR 3 nodule and does not meet criteria for biopsy or dedicated follow-up.   IMPRESSION: 1. Bilateral thyroid nodules. 2. Previously biopsied nodules are stable. The other nodules do not meet criteria for biopsy or  dedicated follow-up.  Pt denies: - feeling nodules in neck - hoarseness - dysphagia - choking  Latest TSH was normal: Lab Results  Component Value Date   TSH 1.29 10/12/2022   ROS: + See HPI  I reviewed pt's medications, allergies, PMH, social hx, family hx, and changes were documented in the history of present illness. Otherwise, unchanged from my initial visit note.  Past Medical History:  Diagnosis Date   Allergy    Diabetes mellitus    Hyperlipidemia    Hypertension    Myocardial infarction [facility]    May 2016   Nephrolithiasis    3x 2007-2015, passed on his own   Sleep apnea    wears CPAP   Past Surgical History:  Procedure Laterality Date   CARDIAC CATHETERIZATION     stent May 2016   COLONOSCOPY     heart stent     May 2016   HERNIA REPAIR     Social History   Socioeconomic History   Marital status: Married    Spouse name: Not on file   Number of children: Not on file   Years of education: Not on file   Highest education level: Not on file  Occupational History   Not on file  Tobacco Use   Smoking status:  Never   Smokeless tobacco: Never  Substance and Sexual Activity   Alcohol use: No   Drug use: No   Sexual activity: Never  Other Topics Concern   Not on file  Social History Narrative   Married. 2 children son and daughter. Granddaughter 12/02/2014 born and grandson 7/17//2018.       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      Hobbies: golfing (low to mid 67s)   Social Determinants of Corporate investment banker Strain: Low Risk  (08/21/2022)   Overall Financial Resource Strain (CARDIA)    Difficulty of Paying Living Expenses: Not very hard  Food Insecurity: No Food Insecurity (08/21/2022)   Hunger Vital Sign    Worried About Running Out of Food in the Last Year: Never true    Ran Out of Food in the Last Year: Never true  Transportation Needs: No Transportation Needs (08/21/2022)   PRAPARE - Administrator, Civil Service (Medical): No    Lack of Transportation (Non-Medical): No  Physical Activity: Sufficiently Active (08/21/2022)   Exercise Vital Sign    Days of Exercise per Week: 3 days    Minutes of Exercise per Session: 60 min  Stress: No Stress Concern Present (08/21/2022)   [employer] of Occupational Health - Occupational Stress Questionnaire    Feeling of Stress : Not at all  Social Connections: Socially Integrated (08/21/2022)   Social Connection and Isolation Panel [NHANES]    Frequency of Communication with Friends and Family: Once a week    Frequency of Social Gatherings with Friends and Family: Twice a week    Attends Religious Services: More than 4 times per year    Active Member of [employer] or Organizations: Yes    Attends Banker Meetings: 1 to 4 times per year    Marital Status: Married  Catering manager Violence: Not At Risk (08/22/2022)   Humiliation, Afraid, Rape, and Kick questionnaire    Fear of Current or Ex-Partner: No    Emotionally Abused: No    Physically Abused: No    Sexually  Abused: No   Current Outpatient Medications  on File Prior to Visit  Medication Sig Dispense Refill   aspirin 81 MG tablet Take 81 mg by mouth daily.     atorvastatin (LIPITOR) 40 MG tablet Take 1 tablet (40 mg total) by mouth daily. 90 tablet 3   carvedilol (COREG) 3.125 MG tablet Take 1 tablet (3.125 mg total) by mouth 2 times daily. 60 tablet 3   EQ FIBER SUPPLEMENT PO Take 1 tablet by mouth daily. Unknown strength     fluticasone (FLONASE) 50 MCG/ACT nasal spray Place 1 spray into both nostrils daily as needed for allergies or rhinitis.     insulin lispro (HUMALOG KWIKPEN) 100 UNIT/ML KwikPen INJECT 25-35 UNITS SUBCUTANEOUSLY THREE TIMES DAILY 45 mL 0   JARDIANCE 25 MG TABS tablet TAKE ONE TABLET (25mg ) BY MOUTH DAILY 90 tablet 5   lisinopril (ZESTRIL) 40 MG tablet Take 1 tablet (40 mg total) by mouth daily. 90 tablet 3   metFORMIN (GLUCOPHAGE) 1000 MG tablet Take 1 tablet (1,000 mg total) by mouth 2 (two) times daily with a meal. 180 tablet 3   nitroGLYCERIN (NITROSTAT) 0.4 MG SL tablet PLACE 1 TABLET UNDER TONGUE EVERY 5 MINUTES AS NEEDED FOR CHEST PAIN. CALL 911/DOCTOR IF YOU TAKE 2 TABLETS. max of 3 tablets/day. (Patient taking differently: Place 0.4 mg under the tongue every 5 (five) minutes as needed for chest pain.) 25 tablet 3   Omega-3 Fatty Acids (FISH OIL) 1000 MG CAPS Take 4 capsules by mouth daily.     OZEMPIC, 1 MG/DOSE, 4 MG/3ML SOPN Inject 1 mg into the skin once a week. 9 mL 3   TOUJEO MAX SOLOSTAR 300 UNIT/ML Solostar Pen Inject 80 Units into the skin daily. 24 mL 3   vitamin B-12 (CYANOCOBALAMIN) 500 MCG tablet Take 500 mcg by mouth daily.     No current facility-administered medications on file prior to visit.   Allergies  Allergen Reactions   Invokana [Canagliflozin] Other (See Comments)    Yeast infection   Family History  Problem Relation Age of Onset   Coronary artery disease Father    Liver disease Father        from heart meds per pt   Diabetes Sister         grandmother   Kidney disease Sister        dialysis from dm   Diabetes Sister    Other Sister        flash pulmonary edema led to death   Colon cancer Neg Hx    Esophageal cancer Neg Hx    Rectal cancer Neg Hx    Stomach cancer Neg Hx    PE: BP 138/80 (BP Location: Right Arm, Patient Position: Sitting, Cuff Size: Large)   Pulse 83   Resp 18   Ht 5\' 10"  (1.778 m)   Wt 233 lb 9.6 oz (106 kg)   SpO2 99%   BMI 33.52 kg/m   Wt Readings from Last 3 Encounters:  12/22/22 233 lb 9.6 oz (106 kg)  10/12/22 234 lb (106.1 kg)  09/04/22 234 lb 6.4 oz (106.3 kg)   Constitutional: overweight, in NAD Eyes: EOMI, no exophthalmos ENT:no thyromegaly but left thyroid nodule felt on palpation, no cervical lymphadenopathy Cardiovascular: RRR, No MRG Respiratory: CTA B Musculoskeletal: no deformities Skin: no rashes Neurological: no tremor with outstretched hands Diabetic Foot Exam - Simple   Simple Foot Form Diabetic Foot exam was performed with the following findings: Yes 12/22/2022  8:54 AM  Visual Inspection No deformities, no ulcerations,  no other skin breakdown bilaterally: Yes Sensation Testing Intact to touch and monofilament testing bilaterally: Yes Pulse Check Posterior Tibialis and Dorsalis pulse intact bilaterally: Yes Comments    ASSESSMENT: 1. DM2, insulin-dependent, controlled, with complications - CAD, s/p AMi - DR  He does not have a history of pancreatitis or family history of medullary thyroid cancer.    2. HL  3.  Obesity class I  4.  Thyroid nodules  PLAN:  1. Patient with longstanding, fairly well-controlled type 2 diabetes, on a complex medication regimen with all medications (metformin, SGLT2 inhibitor) along with weekly GLP-1 receptor agonist and basal/bolus insulin regimen.  On this regimen, latest HbA1c was lower, at 6.9%.  Most of the blood sugars were at goal with occasional lower blood sugars after dinner and in the middle of the night, down to 67.   I advised him to reduce the Humalog dose before dinner.  We continued the rest of the regimen. -At today's visit, sugars are mostly at goal, with occasional lower values at night.  Also, he may have lower blood sugars during the day, also.  I recommended to decrease his Humalog dose with lunch and dinner.  Otherwise, we can continue the same regimen. - I suggested to:  Patient Instructions  Please continue: - Metformin 1000 mg 2x a day. - Jardiance 25 mg daily before breakfast  - Toujeo 80 units daily - Ozempic 1 mg weekly - Humalog: - 25 units before b'fast - 20-25 units before lunch - 20-25 units before dinner  Please return in 4 months with your sugar log.  - we checked his HbA1c: 6.5% (LOWER) - advised to check sugars at different times of the day - 4x a day, rotating check times - advised for yearly eye exams >> he is UTD - will check an ACR today - return to clinic in 4 months  2. HL -Reviewed latest lipid panel from 11/2021: LDL at goal, triglycerides slightly high, HDL slightly low: Lab Results  Component Value Date   CHOL 128 12/14/2021   HDL 33.40 (L) 12/14/2021   LDLCALC 56 12/14/2021   LDLDIRECT 52.0 10/12/2022   TRIG 194.0 (H) 12/14/2021   CHOLHDL 4 12/14/2021  -He continues Lipitor 40 mg daily and fish oil 1000 mg daily without side effects -Will check a lipid panel today  3.  Obesity class I -continue SGLT 2 inhibitor and GLP-1 receptor agonist which should also help with weight loss -He gained 4 pounds before last visit after a cruise to the [place], previously lost 7 pounds  4.  Thyroid nodules -He denies neck compression symptoms -Latest TSH was normal: Lab Results  Component Value Date   TSH 1.29 10/12/2022  -Thyroid ultrasound report from 03/2020 showed 2 dominant nodules which were biopsied with benign results; the rest of the nodules were small and not worrisome -We obtained another thyroid ultrasound in 04/2022 which showed stable  nodules  Component     Latest Ref Rng 12/22/2022  Hemoglobin A1C     4.0 - 5.6 % 6.5 !   Cholesterol     0 - 200 mg/dL 161   Triglycerides     0.0 - 149.0 mg/dL 096.0 (H)   HDL Cholesterol     >39.00 mg/dL 45.40 (L)   VLDL     0.0 - 40.0 mg/dL 98.1   Total CHOL/HDL Ratio 4   NonHDL 87.58   LDL (calc)     0 - 99 mg/dL 56   Microalb, Ur  0.0 - 1.9 mg/dL 0.9   Creatinine,U     mg/dL 161.0   MICROALB/CREAT RATIO     0.0 - 30.0 mg/g 0.8   LDL at goal, triglycerides at goal (nonfasting), while HDL is low, similar to before. ACR normal.  Carlus Pavlov, MD PhD [facility]

## 2023-01-09 ENCOUNTER — Other Ambulatory Visit: Payer: Self-pay | Admitting: Family Medicine

## 2023-02-07 ENCOUNTER — Other Ambulatory Visit: Payer: Self-pay | Admitting: Internal Medicine

## 2023-02-07 DIAGNOSIS — Z794 Long term (current) use of insulin: Secondary | ICD-10-CM

## 2023-03-06 ENCOUNTER — Other Ambulatory Visit: Payer: Self-pay | Admitting: Internal Medicine

## 2023-04-08 ENCOUNTER — Other Ambulatory Visit: Payer: Self-pay | Admitting: Family Medicine

## 2023-04-27 ENCOUNTER — Encounter: Payer: Self-pay | Admitting: Family Medicine

## 2023-04-27 ENCOUNTER — Ambulatory Visit (INDEPENDENT_AMBULATORY_CARE_PROVIDER_SITE_OTHER): Payer: PPO | Admitting: Family Medicine

## 2023-04-27 VITALS — BP 110/64 | HR 82 | Temp 97.2°F | Ht 70.0 in | Wt 234.6 lb

## 2023-04-27 DIAGNOSIS — E1169 Type 2 diabetes mellitus with other specified complication: Secondary | ICD-10-CM

## 2023-04-27 DIAGNOSIS — E1159 Type 2 diabetes mellitus with other circulatory complications: Secondary | ICD-10-CM | POA: Diagnosis not present

## 2023-04-27 DIAGNOSIS — E785 Hyperlipidemia, unspecified: Secondary | ICD-10-CM | POA: Diagnosis not present

## 2023-04-27 DIAGNOSIS — I10 Essential (primary) hypertension: Secondary | ICD-10-CM | POA: Diagnosis not present

## 2023-04-27 DIAGNOSIS — E042 Nontoxic multinodular goiter: Secondary | ICD-10-CM

## 2023-04-27 DIAGNOSIS — I2510 Atherosclerotic heart disease of native coronary artery without angina pectoris: Secondary | ICD-10-CM | POA: Diagnosis not present

## 2023-04-27 DIAGNOSIS — Z7984 Long term (current) use of oral hypoglycemic drugs: Secondary | ICD-10-CM

## 2023-04-27 DIAGNOSIS — Z125 Encounter for screening for malignant neoplasm of prostate: Secondary | ICD-10-CM | POA: Diagnosis not present

## 2023-04-27 DIAGNOSIS — Z7985 Long-term (current) use of injectable non-insulin antidiabetic drugs: Secondary | ICD-10-CM | POA: Diagnosis not present

## 2023-04-27 DIAGNOSIS — E113299 Type 2 diabetes mellitus with mild nonproliferative diabetic retinopathy without macular edema, unspecified eye: Secondary | ICD-10-CM

## 2023-04-27 DIAGNOSIS — Z0000 Encounter for general adult medical examination without abnormal findings: Secondary | ICD-10-CM | POA: Diagnosis not present

## 2023-04-27 LAB — CBC WITH DIFFERENTIAL/PLATELET
Basophils Absolute: 0 10*3/uL (ref 0.0–0.1)
Basophils Relative: 0.9 % (ref 0.0–3.0)
Eosinophils Absolute: 0.2 10*3/uL (ref 0.0–0.7)
Eosinophils Relative: 3.4 % (ref 0.0–5.0)
HCT: 43.3 % (ref 39.0–52.0)
Hemoglobin: 14.3 g/dL (ref 13.0–17.0)
Lymphocytes Relative: 40.1 % (ref 12.0–46.0)
Lymphs Abs: 2.3 10*3/uL (ref 0.7–4.0)
MCHC: 33 g/dL (ref 30.0–36.0)
MCV: 87.9 fL (ref 78.0–100.0)
Monocytes Absolute: 0.5 10*3/uL (ref 0.1–1.0)
Monocytes Relative: 8.1 % (ref 3.0–12.0)
Neutro Abs: 2.7 10*3/uL (ref 1.4–7.7)
Neutrophils Relative %: 47.5 % (ref 43.0–77.0)
Platelets: 191 10*3/uL (ref 150.0–400.0)
RBC: 4.93 Mil/uL (ref 4.22–5.81)
RDW: 15 % (ref 11.5–15.5)
WBC: 5.6 10*3/uL (ref 4.0–10.5)

## 2023-04-27 LAB — COMPREHENSIVE METABOLIC PANEL
ALT: 18 U/L (ref 0–53)
AST: 15 U/L (ref 0–37)
Albumin: 4.3 g/dL (ref 3.5–5.2)
Alkaline Phosphatase: 50 U/L (ref 39–117)
BUN: 19 mg/dL (ref 6–23)
CO2: 27 meq/L (ref 19–32)
Calcium: 9.2 mg/dL (ref 8.4–10.5)
Chloride: 105 meq/L (ref 96–112)
Creatinine, Ser: 0.94 mg/dL (ref 0.40–1.50)
GFR: 83.76 mL/min (ref >60.00)
Glucose, Bld: 85 mg/dL (ref 70–99)
Potassium: 4.4 meq/L (ref 3.5–5.1)
Sodium: 140 meq/L (ref 135–145)
Total Bilirubin: 0.6 mg/dL (ref 0.2–1.2)
Total Protein: 6.6 g/dL (ref 6.0–8.3)

## 2023-04-27 LAB — TSH: TSH: 0.99 u[IU]/mL (ref 0.35–5.50)

## 2023-04-27 LAB — HEMOGLOBIN A1C: Hgb A1c MFr Bld: 7.7 % — ABNORMAL HIGH (ref 4.6–6.5)

## 2023-04-27 LAB — PSA, MEDICARE: PSA: 0.74 ng/mL (ref 0.10–4.00)

## 2023-04-27 NOTE — Patient Instructions (Addendum)
Sign release of information at the check out desk for Diabetic Eye Exam.  Please stop by lab before you go If you have mychart- we will send your results within 3 business days of [place] receiving them.  If you do not have mychart- we will call you about results within 5 business days of [place] receiving them.  *please also note that you will see labs on mychart as soon as they post. I will later go in and write notes on them- will say "notes from Dr. Durene Cal"   Recommended follow up: Return in about 6 months (around 10/25/2023) for followup or sooner if needed.Schedule b4 you leave.

## 2023-04-27 NOTE — Progress Notes (Signed)
Phone: [phone]   Subjective:  Patient presents today for their annual physical. Chief complaint-noted.   See problem oriented charting- ROS- full  review of systems was completed and negative  Per full ROS sheet completed by patient other than trigger thumb on right and some wrist issues  The following were reviewed and entered/updated in epic: Past Medical History:  Diagnosis Date   Allergy    Diabetes mellitus    Hyperlipidemia    Hypertension    Myocardial infarction [facility])    May 2016   Nephrolithiasis    3x 2007-2015, passed on his own   Sleep apnea    wears CPAP   Patient Active Problem List   Diagnosis Date Noted   Diabetic retinopathy (HCC) 02/12/2017    Priority: High   CAD (coronary artery disease) status post PTCA and stent to obtuse marginal branch in 2016 08/18/2014    Priority: High   DM (diabetes mellitus) (HCC) 02/04/2006    Priority: High   Multiple thyroid nodules 03/31/2020    Priority: Medium    OSA on CPAP 08/26/2019    Priority: Medium    GERD (gastroesophageal reflux disease) 08/19/2014    Priority: Medium    Hyperlipidemia associated with type 2 diabetes mellitus (HCC) 02/04/2006    Priority: Medium    Essential hypertension 02/04/2006    Priority: Medium    Allergy 12/02/2020    Priority: Low   Old MI (myocardial infarction) 01/29/2017    Priority: Low   Nephrolithiasis     Priority: Low   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    Priority: Low   RECTAL FISSURE 06/05/2006    Priority: Low   Past Surgical History:  Procedure Laterality Date   CARDIAC CATHETERIZATION     stent May 2016   COLONOSCOPY     heart stent     May 2016   HERNIA REPAIR      Family History  Problem Relation Age of Onset   Coronary artery disease Father    Liver disease Father        from heart meds per pt   Diabetes Sister        grandmother   Kidney disease Sister        dialysis from dm   Diabetes Sister    Other Sister        flash pulmonary edema led  to death   Colon cancer Neg Hx    Esophageal cancer Neg Hx    Rectal cancer Neg Hx    Stomach cancer Neg Hx     Medications- reviewed and updated Current Outpatient Medications  Medication Sig Dispense Refill   aspirin 81 MG tablet Take 81 mg by mouth daily.     atorvastatin (LIPITOR) 40 MG tablet Take 1 tablet (40 mg total) by mouth daily. 90 tablet 3   carvedilol (COREG) 3.125 MG tablet Take 1 tablet (3.125 mg total) by mouth 2 times daily. 60 tablet 3   EQ FIBER SUPPLEMENT PO Take 1 tablet by mouth daily. Unknown strength     fluticasone (FLONASE) 50 MCG/ACT nasal spray Place 1 spray into both nostrils daily as needed for allergies or rhinitis.     insulin lispro (HUMALOG KWIKPEN) 100 UNIT/ML KwikPen INJECT 25-35 UNITS SUBCUTANEOUSLY THREE TIMES DAILY 45 mL 0   JARDIANCE 25 MG TABS tablet TAKE ONE TABLET (25mg ) BY MOUTH DAILY 90 tablet 5   lisinopril (ZESTRIL) 40 MG tablet Take 1 tablet (40 mg total) by mouth daily. 90  tablet 3   metFORMIN (GLUCOPHAGE) 1000 MG tablet Take 1 tablet (1,000 mg total) by mouth 2 (two) times daily with a meal. 180 tablet 3   Omega-3 Fatty Acids (FISH OIL) 1000 MG CAPS Take 4 capsules by mouth daily.     OZEMPIC, 1 MG/DOSE, 4 MG/3ML SOPN Inject 1 mg into the skin once a week. 9 mL 3   TOUJEO MAX SOLOSTAR 300 UNIT/ML Solostar Pen Inject 80 Units into the skin daily. 24 mL 3   vitamin B-12 (CYANOCOBALAMIN) 500 MCG tablet Take 500 mcg by mouth daily.     nitroGLYCERIN (NITROSTAT) 0.4 MG SL tablet PLACE 1 TABLET UNDER TONGUE EVERY 5 MINUTES AS NEEDED FOR CHEST PAIN. CALL 911/DOCTOR IF YOU TAKE 2 TABLETS. max of 3 tablets/day. (Patient not taking: Reported on 04/27/2023) 25 tablet 3   No current facility-administered medications for this visit.    Allergies-reviewed and updated Allergies  Allergen Reactions   Invokana [Canagliflozin] Other (See Comments)    Yeast infection    Social History   Social History Narrative   Married. 2 children son and  daughter. Granddaughter 12/02/2014 born and grandson 7/17//2018.       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 Oropharynx normal Neck: no cervical lymphadenopathy CV: RRR no murmurs rubs or gallops Lungs: CTAB no crackles, wheeze, rhonchi Abdomen: soft/nontender/nondistended/normal bowel sounds. No rebound or guarding. Umbilical hernia Ext: no edema Skin: warm, dry Neuro: grossly normal, moves all extremities, PERRLA   Assessment and Plan  68 y.o. male presenting for annual physical.  Health Maintenance counseling: 1. Anticipatory guidance: Patient counseled regarding regular dental exams -q6 months, eye exams -yearly,  avoiding smoking and second hand smoke , limiting alcohol to 2 beverages per day - doesn't drink regularly- very seldom social like on a cruise, no illicit drugs .   2. Risk factor reduction:  Advised patient of need for regular exercise and diet rich and fruits and vegetables to reduce risk of heart attack and stroke.  Exercise- less active due to cold lately- paid membership to [employer] golf [employer] and plans to get out and walk in evenings.  Diet/weight management-from last year  Weight up 2 pounds but stable from last visit Wt Readings from Last 3 Encounters:  04/27/23 234 lb 9.6 oz (106.4 kg)  12/22/22 233 lb 9.6 oz (106 kg)  10/12/22 234 lb (106.1 kg)  3. Immunizations/screenings/ancillary studies-fully up-to-date.  Team requesting copy of diabetic eye exam Immunization History  Administered Date(s) Administered   Fluad Quad(high Dose 65+) 02/23/2022   Influenza Split 04/17/2011   Influenza Whole 01/02/2007, 01/07/2009, 02/04/2010, 01/06/2012   Influenza, High  Dose Seasonal PF 01/08/2021, 02/09/2023   Influenza, Quadrivalent, Recombinant, Inj, Pf 01/04/2019   Influenza,inj,Quad PF,6+ Mos 03/14/2013, 02/03/2014, 01/06/2015, 11/28/2017, 03/03/2020   Influenza-Unspecified 12/25/2012, 01/28/2016, 01/13/2017   Moderna Covid-19 Fall Seasonal Vaccine 53yrs & older 02/09/2023   PFIZER Comirnaty(Gray Top)Covid-19 Tri-Sucrose Vaccine 10/04/2020   PFIZER(Purple Top)SARS-COV-2 Vaccination 05/30/2019, 06/30/2019   PNEUMOCOCCAL CONJUGATE-20 04/04/2021   Pfizer Covid-19 Vaccine Bivalent Booster 60yrs & up 02/23/2022   Pneumococcal  Conjugate-13 07/15/2013   Pneumococcal Polysaccharide-23 05/11/2008, 08/19/2014   Tdap 07/15/2013   Zoster Recombinant(Shingrix) 02/24/2019, 08/26/2019   4. Prostate cancer screening- low risk prior trend- update psa today   Lab Results  Component Value Date   PSA 0.94 04/07/2022   PSA 0.61 04/04/2021   PSA 0.85 03/03/2020   5. Colon cancer screening - 04/28/2016 with 10-year repeat planned  6. Skin cancer screening- no dermatologist in years-advised regular sunscreen use. Denies worrisome, changing, or new skin lesions.   7. Smoking associated screening (lung cancer screening, AAA screen 65-75, UA)-never smoker  8. STD screening -only active with wife  Status of chronic or acute concerns   %# CAD- follows with Dr. Christene Lye cardiology in the past now with Dr. Bing Matter with [facility] #hyperlipidemia S:compliant with aspirin and atorvastatin 40 mg  (2 in a row then day off and repeats) and omega-3 fatty acids Symptoms- no chest pain or shortness of breath   -1 stent 2015 Lab Results  Component Value Date   CHOL 121 12/22/2022   HDL 33.10 (L) 12/22/2022   LDLCALC 56 12/22/2022   LDLDIRECT 52.0 10/12/2022   TRIG 158.0 (H) 12/22/2022   CHOLHDL 4 12/22/2022  A/P: CAD- asymptomatic continue current medications  LDL goal under 70- hold off on lipid panel as just done- continue current medications    #hypertension S:  compliant with coreg 3.125mg  BID, lisinopril 40mg   BP Readings from Last 3 Encounters:  04/27/23 110/64  12/22/22 138/80  10/12/22 118/60  A/P: stable- continue current medicines   %# Diabetes with history of diabetic retinopathy S: followed closely by Dr. Elvera Lennox and on both long (toujeo 80) and short acting insulin- lispro 25-35 units TID, Ozempic 1 mg, metformin 1000 mg twice daily (takes B12 for this), Jardiance 25 mg.   -Fructosamine more relevant for him- we do a1c when we see him primarily for metrics.  Lab Results  Component Value Date   HGBA1C 6.5 (A) 12/22/2022   HGBA1C 6.9 (A) 08/18/2022   HGBA1C 7.2 (H) 04/07/2022    A/P: a1c has bene well controlled - update today and forward to Dr. Elvera Lennox- continue current medications   # Multiple thyroid nodules-I am in [place] FN 02/12/2019 benign with Dr. Elvera Lennox.  - check TSH  . Last scan February 2024  #Fatty liver- incidental finding when evaluating RUQ pain- monitor today Lab Results  Component Value Date   ALT 18 10/12/2022   AST 15 10/12/2022   ALKPHOS 52 10/12/2022   BILITOT 0.9 10/12/2022   #Failed hearing test- considering hearing aids in 2024- may get through [facility] - has tried some OTC (available over the counter without a prescription) options- wants to hold off on formal hearing aids but may go later in year  Recommended follow up: Return in about 6 months (around 10/25/2023) for followup or sooner if needed.Schedule b4 you leave. Future Appointments  Date Time Provider Department Center  05/03/2023  8:40 AM Carlus Pavlov, MD [facility] None  08/28/2023  8:45 AM [facility] ANNUAL WELLNESS VISIT 1 [facility]   Lab/Order associations: fasting   ICD-10-CM   1. Preventative health care  Z00.00     2. Type 2 diabetes mellitus with other circulatory complication, without long-term current use of insulin (HCC)  E11.59     3. Coronary artery disease involving native coronary artery of native heart without angina pectoris   I25.10     4. Mild nonproliferative diabetic retinopathy without macular edema associated with type 2 diabetes mellitus,  unspecified laterality (HCC)  E11.3299     5. Hyperlipidemia associated with type 2 diabetes mellitus (HCC)  E11.69    E78.5     6. Essential hypertension  I10     7. Multiple thyroid nodules  E04.2     8. Screening for prostate cancer  Z12.5       No orders of the defined types were placed in this encounter.   Return precautions advised.  [name], MD

## 2023-05-03 ENCOUNTER — Ambulatory Visit: Payer: PPO | Admitting: Internal Medicine

## 2023-05-03 ENCOUNTER — Encounter: Payer: Self-pay | Admitting: Internal Medicine

## 2023-05-03 VITALS — BP 130/70 | HR 86 | Ht 70.0 in | Wt 236.8 lb

## 2023-05-03 DIAGNOSIS — E785 Hyperlipidemia, unspecified: Secondary | ICD-10-CM | POA: Diagnosis not present

## 2023-05-03 DIAGNOSIS — E1159 Type 2 diabetes mellitus with other circulatory complications: Secondary | ICD-10-CM | POA: Diagnosis not present

## 2023-05-03 DIAGNOSIS — Z794 Long term (current) use of insulin: Secondary | ICD-10-CM | POA: Diagnosis not present

## 2023-05-03 DIAGNOSIS — E042 Nontoxic multinodular goiter: Secondary | ICD-10-CM | POA: Diagnosis not present

## 2023-05-03 MED ORDER — INSULIN LISPRO (1 UNIT DIAL) 100 UNIT/ML (KWIKPEN): PEN_INJECTOR | SUBCUTANEOUS | Status: AC

## 2023-05-03 NOTE — Progress Notes (Signed)
 Patient ID: Zachary Warner, male   DOB: 01-Oct-1955, 68 y.o.   MRN: [id number]  HPI: Zachary Warner is a 68 y.o.-year-old male, presenting for f/u for DM2, dx in ~1995, insulin -dependent since ~2005, uncontrolled, with complications (CAD - s/p AMI, DR). Last visit 4 months ago.  Interim history: No increased urination, blurry vision, nausea, chest pain.   His mother died a few days ago due to complications from lung cancer.  He is grieving.  His diet was less organized before her passing.  He had some higher blood sugars.  DM2:  Reviewed HbA1c levels: Lab Results  Component Value Date   HGBA1C 7.7 (H) 04/27/2023   HGBA1C 6.5 (A) 12/22/2022   HGBA1C 6.9 (A) 08/18/2022  07/22/2020: HbA1c calculated from fructosamine is 6.16%, excellent. 03/31/2020: HbA1c calculated from fructosamine is at goal, at 6.1%. 11/25/2019: HbA1c calculated from fructosamine is 6.2% 01/10/2019: HbA1c calculated from fructosamine 6.35% 04/09/2017: HbA1c calculated from fructosamine is 6.8% 11/28/2017:  Hba1c calculated from fructosamine is better: 6.45% 07/11/2017: HbA1c calculated from the fructosamine is 6.6%. 03/08/2017: HbA1c calculated from the fructosamine is 6.5%. 11/06/2016: HbA1c calculated from the fructosamine is slightly higher, at 6.6% 07/05/2016: HbA1c calculated from the fructosamine is 6.2% 04/05/2016: HbA1c calculated from fructosamine is excellent, at 6% 01/04/2016: HbA1c calculated from fructosamine is  6.55%. 11/10/2015: HbA1c calculated from fructosamine is 5.8%. 08/10/2015: HbA1c calculated from fructosamine is 7.05%. 04/09/2015: HbA1c calculated fructosamine (326): 7.1%, which is much more concordant with his sugar log.   He is on: - Metformin  1000 mg 2x a day. - Jardiance  25 mg daily before breakfast - Ozempic  0.5 mg weekly-added 07/2019 >> 1 mg weekly - Tresiba  U200  >> Toujeo  80 units daily - Humalog : 25-30-40 units before meals >> 25-30-35 (36-38) units before meals >> - 25 units before  b'fast - 25-28 >> 20-25 units before lunch  - 30-32 >> 20-25 units before dinner We stopped Januvia  2/2 large doses of mealtime insulin . He initially had yeast infections with Jardiance , but now controlled with Azo.  He checks his sugars 1x a day: - am: 105-120 >> 83-112 >> 104-146 >> 81-127, 139 >> 69-121, 198 - 2h after b'fast: 148 >> n/c >> 103-179 >> 165 >> 99-119 - lunch:  130-177 >> 138, 147 >> n/c >> 67 >> n/c >> 132, 148 - 2h after lunch: 124, 142, 260 >> 106, 120, 222, 266 >> n/c - dinner: 125 >> n/c >> 120-139 >> 72, 130, 147 >> 89 >> n/c - 2h after dinner: 148-189, 300 >> 200 >> 160, 163 >> n/c - bedtime: 169, 194 >> see below >> 72-164 >> 67, 72 >> n/c - nighttime:  67 x2, 71, 79, 143 >> 56, 63, 70, 74, 83 >> 176 Low sugar: 50 - at night ... >>  56x 1, 63 >> 69; has hypoglycemia awareness in the 60s. Highest sugar was 368 ...>> 169 >> 190.  He has a ReliOn meter.  Pt's meals are: - Breakfast: grits or sandwich - Lunch: sandwich - Dinner: chicken or beef or pork + vegetables - Snacks: 1 or 2 snacks  - crackers, peanuts or cookies  No CKD, last BUN/creatinine:  Lab Results  Component Value Date   BUN 19 04/27/2023   CREATININE 0.94 04/27/2023   Lab Results  Component Value Date   MICRALBCREAT 0.8 12/22/2022   MICRALBCREAT 1.0 04/07/2022   MICRALBCREAT 0.8 02/04/2016   MICRALBCREAT 0.6 07/08/2013   MICRALBCREAT 0.6 03/07/2013   MICRALBCREAT 13.7 05/29/2006  On lisinopril .  +  HL; last set of lipids: Lab Results  Component Value Date   CHOL 121 12/22/2022   HDL 33.10 (L) 12/22/2022   LDLCALC 56 12/22/2022   LDLDIRECT 52.0 10/12/2022   TRIG 158.0 (H) 12/22/2022   CHOLHDL 4 12/22/2022  On Lipitor 40 mg daily and fish oil  1000 mg 1x a day.  - Latest eye exam: 06/2022: No DR reportedly, prev.+ DR. He sees Dr. Elspeth Coventry.  - no numbness and tingling in his feet. Last foot exam: 12/22/2022.  He also has a history of HTN, h/o nephrolithiasis x 3, last in  03/2013, OSA.   He had an MI 07/2014 >> had a stent placed. Cardiologist: Dr Krosowski. He is on supplementation with B12. He had heel pain, improved after starting to use special socks and using a TENS unit-Plantar fasciitis.  He is retired and works only 3 days a week (10 am to 4 pm).  He loves it.  Thyroid  nodules:  Thyroid  U/S (01/22/2019): Several nodules, of which 2 were dominant: Parenchymal Echotexture: Moderately heterogenous Isthmus: 1.0 cm Right lobe: 5.4 x 2.0 x 1.8 cm Left lobe: 4.7 x 2.4 x 2.0 cm _____________________________________   Estimated total number of nodules >/= 1 cm: 5 _____________________________________   Nodule # 1: Location: Isthmus; Mid Maximum size: 1.9 cm; Other 2 dimensions: 1.3 x 1.7 cm Composition: cannot determine (2) Echogenicity: hypoechoic (2) Echogenic foci: macrocalcifications (1) ACR TI-RADS total points: 5.  **Given size (>/= 1.5 cm) and appearance, fine needle aspiration of this moderately suspicious nodule should be considered based on TI-RADS criteria. _________________________________________________________   Nodule # 2: Location: Right; Superior Maximum size: 1.0 cm; Other 2 dimensions: 0.6 x 0.7 cm Composition: solid/almost completely solid (2) Echogenicity: hypoechoic (2) Echogenic foci: punctate echogenic foci (3) ACR TI-RADS total points: 7.  **Given size (>/= 1.0 cm) and appearance, fine needle aspiration of this highly suspicious nodule should be considered based on TI-RADS criteria. _________________________________________________________   Nodule #3 is a predominantly cystic nodule the mid right thyroid  lobe that measures 1.1 x 0.8 x 0.8 cm.   Nodule #4 is a cystic nodule in the superior left thyroid  lobe that measures 1.5 x 0.9 x 1.0 cm.   Nodule #5 is a mildly complex cystic nodule with septations in the mid left thyroid  lobe that measures 1.2 x 1.0 x 1.3 cm.   Nodule # 6: Location: Left; Mid Maximum  size: 0.8 cm; Other 2 dimensions: 0.6 x 0.5 cm Composition: mixed cystic and solid (1) Echogenicity: isoechoic (1) Shape: taller-than-wide (3) ACR TI-RADS total points: 5. Given size (<0.9 cm) and appearance, this nodule does NOT meet TI-RADS criteria for biopsy or dedicated follow-up. _______________________________________   IMPRESSION: 1. Multinodular goiter. 2. Nodule #1 in the isthmus and nodule #2 in the superior right thyroid  lobe both meet criteria for ultrasound-guided biopsy. 3. Multiple cystic nodules that do not meet criteria for biopsy.   Biopsies of the dominant nodules (02/13/2019): Benign:  Clinical History: Nodule 1 Isthmus Mid 1.9 cm; Other 2 dimensions: 1.3 x  1.7 cm, Hypoechoic, ACR TI-RADS total points: 5, Moderately suspicious  nodule  Specimen Submitted:  A. THYROID  ISTHMUS, FINE NEEDLE ASPIRATION:   FINAL MICROSCOPIC DIAGNOSIS:  - Consistent with benign follicular nodule (Bethesda category II)   SPECIMEN ADEQUACY:  Satisfactory for evaluation   CYTOLOGY - NON PAP  CASE: [id number]  PATIENT: Zachary Warner  Non-Gynecological Cytology Report   Clinical History: Nodule 2 Right Superior 1.0 cm; Other 2 dimensions:  0.6 x 0.7  cm, Solid almost completely solid, Hypoechoic, ACR TI-RADS  total points: 7, Highly suspicious  nodule  Specimen Submitted:  A. THYROID , RT LOBE RUP, FINE NEEDLE ASPIRATION:   FINAL MICROSCOPIC DIAGNOSIS:  - Consistent with benign follicular nodule (Bethesda category II)   SPECIMEN ADEQUACY:  Satisfactory for evaluation  Thyroid  ultrasound (04/20/2020):  Parenchymal Echotexture: Moderately heterogeneous Isthmus: 0.8 cm Right lobe: 4.4 x 2.0 x 1.6 cm Left lobe: 4.8 x 2.0 x 1.8 cm _________________________________________________________  Nodule 1: 1.1 x 1.1 x 0.7 cm nodule located in the isthmus has decreased in size since prior examination where it measured 1.9 x 1.7 x 1.3 cm. FNA of this nodule was performed on  02/12/2019. Please correlate with results.  Nodule 2: 1.0 x 0.9 x 0.6 cm nodule located in the superior right thyroid  lobe is not significantly changed in size measuring 1.0 x 0.7 x 0.6 cm on the prior exam. FNA of this nodule was performed on 02/12/2019. Please correlate with results.  Nodule 3: 1.2 x 0.9 x 0.7 cm cystic nodule located in the mid right thyroid  lobe is not significantly changed in size since prior exam where it measured 1.1 x 0.8 x 0.8 cm. It does not meet criteria for FNA or imaging follow-up.  Nodule 4: 0.6 x 0.6 x 0.3 cm solid hypoechoic nodule in the inferior right thyroid  lobe is new since the prior examination. It does not meet criteria for FNA or imaging follow-up.  Nodule 5: 1.6 x 0.9 x 0.8 cm cystic nodule in the superior left thyroid  lobe is not significantly changed in size since prior examination when where measured 1.5 x 1.0 x 0.9 cm. This does not meet criteria for FNA or imaging follow-up.  Nodule 6: 0.8 x 0.7 x 0.6 cm solid isoechoic nodule in the mid left thyroid  lobe previously measured 1.2 x 1.0 x 1.3 cm. Interval decrease in size favors a benign etiology. This nodule does not meet criteria for imaging follow-up or FNA. _________________________________________________________  IMPRESSION: Bilateral thyroid  nodules. Nodules 1 and 2 were previously biopsied. Please correlate with results. Remaining nodules do not meet criteria for FNA or imaging follow-up.  Thyroid  U/S (05/08/2022): Parenchymal Echotexture: Moderately heterogenous  Isthmus: 0.9 cm, previously 0.8 cm  Right lobe: 4.7 x 1.8 x 1.5 cm, previously 4.4 x 2.0 x 1.6 cm  Left lobe: 4.3 x 1.9 x 1.9 cm, previously 4.8 x 2.0 x 1.8 cm  _________________________________________________________   Estimated total number of nodules >/= 1 cm: 2 _________________________________________________________   Nodule #1 in the isthmus is heterogeneous but hypoechoic. Nodule measures 1.1 x 0.9 x  1.3 cm and previously measured 1.1 x 0.7 x 1.1 cm. Morphology of the nodule is not significantly changed. This represents the previously biopsied nodule.   Nodul #2 in the superior right thyroid  lobe is a hypoechoic nodule with at least 1 small echogenic focus. This represents the previously biopsied nodule and measures 1.1 x 0.6 x 0.8 cm and previously measured 1.0 x 0.6 x 0.9 cm. Morphology of the nodule is stable.   Multiple bilateral thyroid  nodules.   Nodule #5 in the left mid thyroid  lobe is a predominantly isoechoic solid nodule with small cystic component. This nodule measures 0.9 x 0.6 x 0.7 cm. This is compatible with a TR 3 nodule and does not meet criteria for biopsy or dedicated follow-up.   IMPRESSION: 1. Bilateral thyroid  nodules. 2. Previously biopsied nodules are stable. The other nodules do not meet criteria for biopsy or dedicated follow-up.  Pt denies: - feeling nodules in neck - hoarseness - dysphagia - choking  Latest TSH was normal: Lab Results  Component Value Date   TSH 0.99 04/27/2023   ROS: + See HPI  I reviewed pt's medications, allergies, PMH, social hx, family hx, and changes were documented in the history of present illness. Otherwise, unchanged from my initial visit note.  Past Medical History:  Diagnosis Date   Allergy    Diabetes mellitus    Hyperlipidemia    Hypertension    Myocardial infarction [facility])    May 2016   Nephrolithiasis    3x 2007-2015, passed on his own   Sleep apnea    wears CPAP   Past Surgical History:  Procedure Laterality Date   CARDIAC CATHETERIZATION     stent May 2016   COLONOSCOPY     heart stent     May 2016   HERNIA REPAIR     Social History   Socioeconomic History   Marital status: Married    Spouse name: Not on file   Number of children: Not on file   Years of education: Not on file   Highest education level: Not on file  Occupational History   Not on file  Tobacco Use   Smoking status:  Never   Smokeless tobacco: Never  Substance and Sexual Activity   Alcohol use: No   Drug use: No   Sexual activity: Never  Other Topics Concern   Not on file  Social History Narrative   Married. 2 children son and daughter. Granddaughter 12/02/2014 born and grandson 7/17//2018.       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      Hobbies: golfing (low to mid 29s)   Social Drivers of Corporate Investment Banker Strain: Low Risk  (08/21/2022)   Overall Financial Resource Strain (CARDIA)    Difficulty of Paying Living Expenses: Not very hard  Food Insecurity: No Food Insecurity (08/21/2022)   Hunger Vital Sign    Worried About Running Out of Food in the Last Year: Never true    Ran Out of Food in the Last Year: Never true  Transportation Needs: No Transportation Needs (08/21/2022)   PRAPARE - Administrator, Civil Service (Medical): No    Lack of Transportation (Non-Medical): No  Physical Activity: Sufficiently Active (08/21/2022)   Exercise Vital Sign    Days of Exercise per Week: 3 days    Minutes of Exercise per Session: 60 min  Stress: No Stress Concern Present (08/21/2022)   [employer] of Occupational Health - Occupational Stress Questionnaire    Feeling of Stress : Not at all  Social Connections: Socially Integrated (08/21/2022)   Social Connection and Isolation Panel [NHANES]    Frequency of Communication with Friends and Family: Once a week    Frequency of Social Gatherings with Friends and Family: Twice a week    Attends Religious Services: More than 4 times per year    Active Member of [employer] or Organizations: Yes    Attends Banker Meetings: 1 to 4 times per year    Marital Status: Married  Catering Manager Violence: Not At Risk (08/22/2022)   Humiliation, Afraid, Rape, and Kick questionnaire    Fear of Current or Ex-Partner: No    Emotionally Abused: No    Physically Abused: No    Sexually Abused: No    Current Outpatient Medications on File Prior  to Visit  Medication Sig Dispense Refill   aspirin 81 MG tablet Take 81 mg by mouth daily.     atorvastatin  (LIPITOR) 40 MG tablet Take 1 tablet (40 mg total) by mouth daily. 90 tablet 3   carvedilol  (COREG ) 3.125 MG tablet Take 1 tablet (3.125 mg total) by mouth 2 times daily. 60 tablet 3   EQ FIBER SUPPLEMENT PO Take 1 tablet by mouth daily. Unknown strength     fluticasone (FLONASE) 50 MCG/ACT nasal spray Place 1 spray into both nostrils daily as needed for allergies or rhinitis.     insulin  lispro (HUMALOG  KWIKPEN) 100 UNIT/ML KwikPen INJECT 25-35 UNITS SUBCUTANEOUSLY THREE TIMES DAILY 45 mL 0   JARDIANCE  25 MG TABS tablet TAKE ONE TABLET (25mg ) BY MOUTH DAILY 90 tablet 5   lisinopril  (ZESTRIL ) 40 MG tablet Take 1 tablet (40 mg total) by mouth daily. 90 tablet 3   metFORMIN  (GLUCOPHAGE ) 1000 MG tablet Take 1 tablet (1,000 mg total) by mouth 2 (two) times daily with a meal. 180 tablet 3   nitroGLYCERIN  (NITROSTAT ) 0.4 MG SL tablet PLACE 1 TABLET UNDER TONGUE EVERY 5 MINUTES AS NEEDED FOR CHEST PAIN. CALL 911/DOCTOR IF YOU TAKE 2 TABLETS. max of 3 tablets/day. (Patient not taking: Reported on 04/27/2023) 25 tablet 3   Omega-3 Fatty Acids (FISH OIL) 1000 MG CAPS Take 4 capsules by mouth daily.     OZEMPIC , 1 MG/DOSE, 4 MG/3ML SOPN Inject 1 mg into the skin once a week. 9 mL 3   TOUJEO  MAX SOLOSTAR 300 UNIT/ML Solostar Pen Inject 80 Units into the skin daily. 24 mL 3   vitamin B-12 (CYANOCOBALAMIN ) 500 MCG tablet Take 500 mcg by mouth daily.     No current facility-administered medications on file prior to visit.   Allergies  Allergen Reactions   Invokana  [Canagliflozin ] Other (See Comments)    Yeast infection   Family History  Problem Relation Age of Onset   Coronary artery disease Father    Liver disease Father        from heart meds per pt   Diabetes Sister        grandmother   Kidney disease Sister        dialysis from dm   Diabetes  Sister    Other Sister        flash pulmonary edema led to death   Colon cancer Neg Hx    Esophageal cancer Neg Hx    Rectal cancer Neg Hx    Stomach cancer Neg Hx    PE: BP 130/70   Pulse 86   Ht 5' 10 (1.778 m)   Wt 236 lb 12.8 oz (107.4 kg)   SpO2 96%   BMI 33.98 kg/m   Wt Readings from Last 3 Encounters:  05/03/23 236 lb 12.8 oz (107.4 kg)  04/27/23 234 lb 9.6 oz (106.4 kg)  12/22/22 233 lb 9.6 oz (106 kg)   Constitutional: overweight, in NAD Eyes: EOMI, no exophthalmos ENT:no thyromegaly but left thyroid  nodule felt on palpation, no cervical lymphadenopathy Cardiovascular: RRR, No MRG Respiratory: CTA B Musculoskeletal: no deformities Skin: no rashes Neurological: no tremor with outstretched hands  ASSESSMENT: 1. DM2, insulin -dependent, controlled, with complications - CAD, s/p AMi - DR  He does not have a history of pancreatitis or family history of medullary thyroid  cancer.    2. HL  3.  Obesity class I  4.  Thyroid  nodules  PLAN:  1. Patient with longstanding, fairly well-controlled type 2 diabetes,  on a complex medication regimen with oral medications (metformin , SGLT2 inhibitor along with weekly GLP-1 receptor agonist and basal/bolus insulin  regimen.  At last visit HbA1c was lower, at 6.5% and sugars were at goal with  occasional lower values at night and rarely during the day.  We decreased his Humalog  dose with lunch and dinner but did not change the rest of the regimen.  However, afterwards, he had another HbA1c obtained last month and this was higher, at 7.7%.  He attributes this to the holidays and also his mother passing away few days ago.  He had more erratic meals lately. -At today's visit, he mentions that after reducing the doses of Humalog  before lunch and dinner, he has not seen as many lows as before.  In fact, the lowest blood sugar now is 69, and he only had 1 since last visit.  He does have an occasional mildly high blood sugar later in the day,  but the majority of the sugars are well-controlled.  For now, I did not suggest a change in regimen. - I suggested to:  Patient Instructions  Please continue: - Metformin  1000 mg 2x a day. - Jardiance  25 mg daily before breakfast  - Toujeo  80 units daily - Ozempic  1 mg weekly - Humalog : - 25 units before b'fast - 20-25 units before lunch - 20-25 units before dinner  Please return in 3-4 months with your sugar log.  - advised to check sugars at different times of the day - 4x a day, rotating check times - advised for yearly eye exams >> he is UTD - return to clinic in 3-4 months  2. HL -Latest lipid panel showed an LDL at goal, slightly low HDL: Lab Results  Component Value Date   CHOL 121 12/22/2022   HDL 33.10 (L) 12/22/2022   LDLCALC 56 12/22/2022   LDLDIRECT 52.0 10/12/2022   TRIG 158.0 (H) 12/22/2022   CHOLHDL 4 12/22/2022  -He continues Lipitor 40 mg daily and fish oil 1000 mg daily without side effects  3.  Obesity class I -continue SGLT 2 inhibitor and GLP-1 receptor agonist which should also help with weight loss -He gained 3 pounds since last visit  4.  Thyroid  nodules -No neck compression symptoms -Latest TSH was normal, Lab Results  Component Value Date   TSH 0.99 04/27/2023  -The thyroid  ultrasound report from 03/2020 showed 2 dominant nodules, which were biopsied with benign results.  The rest of the nodules were small and not worrisome. -We obtained another thyroid  ultrasound in 04/2022 which showed stable nodules -Will continue to follow him clinically for now  Lela Fendt, MD PhD [facility] End[facility]

## 2023-05-03 NOTE — Patient Instructions (Signed)
 Please continue: - Metformin  1000 mg 2x a day. - Jardiance  25 mg daily before breakfast  - Toujeo  80 units daily - Ozempic  1 mg weekly - Humalog : - 25 units before b'fast - 20-25 units before lunch - 20-25 units before dinner  Please return in 3-4 months with your sugar log.

## 2023-07-08 ENCOUNTER — Other Ambulatory Visit: Payer: Self-pay | Admitting: Family Medicine

## 2023-07-19 ENCOUNTER — Telehealth: Payer: Self-pay | Admitting: Pharmacy Technician

## 2023-07-19 ENCOUNTER — Other Ambulatory Visit (HOSPITAL_COMMUNITY): Payer: Self-pay

## 2023-07-19 NOTE — Telephone Encounter (Signed)
 Pharmacy Patient Advocate Encounter  Received notification from [employer] that Prior Authorization for Ozempic  (1 MG/DOSE) 4MG /3ML pen-injectors has been APPROVED from 07/19/2023 to 07/18/2024. Ran test claim, Copay is $0.00. This test claim was processed through [facility]- copay amounts may vary at other pharmacies due to pharmacy/plan contracts, or as the patient moves through the different stages of their insurance plan.   PA #/Case ID/Reference #: [id number]

## 2023-07-19 NOTE — Telephone Encounter (Signed)
 Pharmacy Patient Advocate Encounter   Received notification from CoverMyMeds that prior authorization for Ozempic  (1 MG/DOSE) 4MG /3ML pen-injectors is required/requested.   Insurance verification completed.   The patient is insured through [facility] ADVANTAGE/RX ADVANCE .   Per test claim: PA required; PA submitted to above mentioned insurance via CoverMyMeds Key/confirmation #/EOC [id number] Status is pending

## 2023-07-31 ENCOUNTER — Ambulatory Visit: Payer: Self-pay

## 2023-07-31 NOTE — Telephone Encounter (Signed)
 Copied from CRM [id number]. Topic: Clinical - Red Word Triage >> Jul 31, 2023  2:12 PM Juleen Oakland F wrote: Red Word that prompted transfer to Nurse Triage: Patient wrist is extremely painful, he wants to know if he should be seen, be referred or get a injection  Chief Complaint: right wrist pain and thumb numb Symptoms: see above Frequency: comes and goes Pertinent Negatives: Patient denies fever, swelling Disposition: [] ED /[] Urgent Care (no appt availability in office) / [x] Appointment(In office/virtual)/ []  [facility]/ [] Home Care/ [] Refused Recommended Disposition /[] [facility]/ []  Follow-up with PCP Additional Notes: per protocol apt made for tomorrow; care advice given, denies questions; instructed to go to ER if becomes worse.   Reason for Disposition  Numbness (i.e., loss of sensation) in hand or fingers  (Exception: Just tingling; numbness present > 2 weeks.)  Answer Assessment - Initial Assessment Questions 1. ONSET: "When did the pain start?"     First of the year 2. LOCATION: "Where is the pain located?"     Right wrist 3. PAIN: "How bad is the pain?" (Scale 1-10; or mild, moderate, severe)   - MILD (1-3): doesn't interfere with normal activities   - MODERATE (4-7): interferes with normal activities (e.g., work or school) or awakens from sleep   - SEVERE (8-10): excruciating pain, unable to use hand at all     Mild to severe when he moves it 4. WORK OR EXERCISE: "Has there been any recent work or exercise that involved this part (i.e., hand or wrist) of the body?"     Accidentally hit it yesterday 5. CAUSE: "What do you think is causing the pain?"     Trigger finger 6. AGGRAVATING FACTORS: "What makes the pain worse?" (e.g., using computer)     Using it 7. OTHER SYMPTOMS: "Do you have any other symptoms?" (e.g., neck pain, swelling, rash, numbness, fever)     denies 8. PREGNANCY: "Is there any chance you are pregnant?" "When was your last menstrual  period?"     na  Protocols used: Hand and Wrist Pain-A-AH

## 2023-08-01 ENCOUNTER — Ambulatory Visit (INDEPENDENT_AMBULATORY_CARE_PROVIDER_SITE_OTHER): Admitting: Family Medicine

## 2023-08-01 VITALS — BP 136/77 | HR 88 | Temp 98.4°F | Ht 70.0 in | Wt 236.0 lb

## 2023-08-01 DIAGNOSIS — Z7985 Long-term (current) use of injectable non-insulin antidiabetic drugs: Secondary | ICD-10-CM

## 2023-08-01 DIAGNOSIS — M25531 Pain in right wrist: Secondary | ICD-10-CM

## 2023-08-01 DIAGNOSIS — I10 Essential (primary) hypertension: Secondary | ICD-10-CM | POA: Diagnosis not present

## 2023-08-01 DIAGNOSIS — M65311 Trigger thumb, right thumb: Secondary | ICD-10-CM

## 2023-08-01 DIAGNOSIS — E1159 Type 2 diabetes mellitus with other circulatory complications: Secondary | ICD-10-CM

## 2023-08-01 DIAGNOSIS — Z7984 Long term (current) use of oral hypoglycemic drugs: Secondary | ICD-10-CM | POA: Diagnosis not present

## 2023-08-01 MED ORDER — MELOXICAM 15 MG PO TABS: 15.0000 mg | ORAL_TABLET | Freq: Every day | ORAL | 0 refills | Status: DC

## 2023-08-01 NOTE — Progress Notes (Signed)
   Zachary Warner is a 68 y.o. male who presents today for an office visit.  Assessment/Plan:  New/Acute Problems: Right Wrist Pain Exam consistent with de Quervain's tenosynovitis.  It is reassuring he has had significant improvement the last couple of days.  We discussed home exercise program and handout was given.  Also recommended using ice to the area a few times per day.  We also discussed splinting which he is already doing though encouraged him to use a thumb spica splint is much as possible.  We also discussed corticosteroid injection versus trial of NSAIDs.  Given that his pain is improving would be reasonable for us  to start with a trial of NSAIDs.  He is agreeable to this plan.  Will start meloxicam  15 mg daily for the next 1 to 2 weeks.  He will follow-up with me or his PCP in the next couple of weeks if not improving and would consider steroid injection at that time.  We discussed reasons to return to care and seek emergent care.  Right Trigger Thumb  No change over the last few months.  Discussed with patient this is a separate issue than his above right wrist pain.  He is having a lot of success with splinting.  May have some improvement with above meloxicam  though he may be considering steroid injection if not improving.  Essential hypertension Initially elevated to 148/78 however at goal on recheck.  He will continue his current regimen with Coreg  3.125 mg twice daily, lisinopril  40 mg daily.  He can monitor at home and follow-up with us  in a few weeks if still not improving.  Type 2 diabetes Follows with endocrinology.  Most recent A1c was 7.7 on Jardiance , metformin , and Ozempic .  We are avoiding systemic steroids if possible as above.     Subjective:  HPI:  See A/P for status of chronic conditions.  Patient is here today with right wrist pain.  Symptoms started a couple of days ago after over stretching his thumb when trying to catch a falling cup.  Pain was initially very  severe.  He has been using Tylenol  for last couple days with some improvement in symptoms.  Worse with motion including picking up objects or any motion of his thumb.  Symptoms have improved somewhat over the last day or 2.  He is also began with a trigger thumb on his right hand as well.  Discussed with this his PCP a few months ago.  Has been using a thumb brace for this with modest success.       Objective:  Physical Exam: BP 136/77   Pulse 88   Temp 98.4 F (36.9 C) (Temporal)   Ht 5\' 10"  (1.778 m)   Wt 236 lb (107 kg)   SpO2 96%   BMI 33.86 kg/m   Gen: No acute distress, resting comfortably MUSCULOSKELETAL - Right Wrist: No deformities noted.  Tender to palpation at radial styloid.  Pain exacerbated with extension of thumb.  Finkelstein positive.  Trigger thumb noted as well with tender nodule at the volar MCP joint. Neuro: Grossly normal, moves all extremities Psych: Normal affect and thought content      Karenann Mcgrory M. Daneil Dunker, MD 08/01/2023 8:06 AM

## 2023-08-01 NOTE — Patient Instructions (Signed)
 It was very nice to see you today!  You have tendinitis in your wrist.  Please work on the exercises.  Use ice a few times daily.  Continue with your thumb splint.  Start the meloxicam .  Let us  know if not improving in the next 1 to 2 weeks.  Return if symptoms worsen or fail to improve.   Take care, Dr Daneil Dunker  PLEASE NOTE:  If you had any lab tests, please let us  know if you have not heard back within a few days. You may see your results on mychart before we have a chance to review them but we will give you a call once they are reviewed by us .   If we ordered any referrals today, please let us  know if you have not heard from their office within the next week.   If you had any urgent prescriptions sent in today, please check with the pharmacy within an hour of our visit to make sure the prescription was transmitted appropriately.   Please try these tips to maintain a healthy lifestyle:  Eat at least 3 REAL meals and 1-2 snacks per day.  Aim for no more than 5 hours between eating.  If you eat breakfast, please do so within one hour of getting up.   Each meal should contain half fruits/vegetables, one quarter protein, and one quarter carbs (no bigger than a computer mouse)  Cut down on sweet beverages. This includes juice, soda, and sweet tea.   Drink at least 1 glass of water with each meal and aim for at least 8 glasses per day  Exercise at least 150 minutes every week.

## 2023-08-23 ENCOUNTER — Other Ambulatory Visit: Payer: Self-pay

## 2023-08-23 DIAGNOSIS — E1159 Type 2 diabetes mellitus with other circulatory complications: Secondary | ICD-10-CM

## 2023-08-23 MED ORDER — TOUJEO MAX SOLOSTAR 300 UNIT/ML ~~LOC~~ SOPN: 80.0000 [IU] | PEN_INJECTOR | Freq: Every day | SUBCUTANEOUS | 3 refills | Status: DC

## 2023-08-28 ENCOUNTER — Ambulatory Visit: Payer: PPO

## 2023-09-11 ENCOUNTER — Encounter: Payer: Self-pay | Admitting: Family Medicine

## 2023-09-11 ENCOUNTER — Ambulatory Visit (INDEPENDENT_AMBULATORY_CARE_PROVIDER_SITE_OTHER): Admitting: Family Medicine

## 2023-09-11 VITALS — BP 118/70 | HR 96 | Temp 97.5°F | Ht 70.0 in | Wt 231.4 lb

## 2023-09-11 DIAGNOSIS — I10 Essential (primary) hypertension: Secondary | ICD-10-CM | POA: Diagnosis not present

## 2023-09-11 DIAGNOSIS — R109 Unspecified abdominal pain: Secondary | ICD-10-CM

## 2023-09-11 DIAGNOSIS — M25531 Pain in right wrist: Secondary | ICD-10-CM

## 2023-09-11 MED ORDER — AMOXICILLIN-POT CLAVULANATE 875-125 MG PO TABS: 1.0000 | ORAL_TABLET | Freq: Two times a day (BID) | ORAL | 0 refills | Status: DC

## 2023-09-11 MED ORDER — MELOXICAM 15 MG PO TABS: 15.0000 mg | ORAL_TABLET | Freq: Every day | ORAL | 0 refills | Status: AC

## 2023-09-11 NOTE — Progress Notes (Signed)
   Zachary Warner is a 68 y.o. male who presents today for an office visit.  Assessment/Plan:  New/Acute Problems: Right wrist pain Consistent with de Quervain's tenosynovitis.  He had good improvement with a course of meloxicam  does had some recurrence of pain since discontinuing.  We did discuss potential treatment options however he would like to try another round of anti-inflammatories as this worked well and his pain level has improved significantly.  Will restart meloxicam .  He will continue to work on home exercise program and splinting.  Is also okay for him to use over-the-counter Voltaren.  He will let us  know if not improving in the next few weeks and would consider referral to sports medicine or steroid injection at that time.  Abdominal Pain / Loose Stools No red flags.  Reassuring exam.  Patient's current symptoms are consistent with his previous diverticular flares that resolved with antibiotics.  Will empirically send in Augmentin  for 7 days, though he will let us  know if symptoms worsen or do not improve and will need further workup at that time.  Essential hypertension At goal on lisinopril  40 mg daily and Coreg  3.125 mg twice daily.     Subjective:  HPI:  See A/P for status of chronic conditions.  Patient is here today for follow-up.  I saw him about 6 weeks ago for right wrist pain.  At that point he was diagnosed with de Quervain's tenosynovitis.  Also had a trigger thumb at that point as well.  We started meloxicam  and discussed home exercise program.  Also recommend to start thumb spica splint.  He did well with meloxicam  and symptoms improved dramatically however since discontinuing several days ago he has had some recurrence of pain in his right wrist.  Trigger thumb has been persistent though does seem to be improving.  He has also had a flare up of abdominal pain for the last week or two. He has having some loose stools as well. He is concerned about flare up of  diverticulitis as his symptoms are similar to the last time he had a flare up. No fevers or chills. No constipation. No melena or hematochezia.        Objective:  Physical Exam: BP 118/70   Pulse 96   Temp (!) 97.5 F (36.4 C) (Temporal)   Ht 5' 10 (1.778 m)   Wt 231 lb 6.4 oz (105 kg)   SpO2 96%   BMI 33.20 kg/m   Gen: No acute distress, resting comfortably CV: Regular rate and rhythm with no murmurs appreciated Pulm: Normal work of breathing, clear to auscultation bilaterally with no crackles, wheezes, or rhonchi Abdomen: Bowel sounds present, soft, nondistended.  Mild tenderness palpation in right lower quadrant.  No rebound or guarding. MUSCULOSKELETAL: No deformities noted.  Tender to palpation of the radial styloid.  Right trigger thumb noted. Neuro: Grossly normal, moves all extremities Psych: Normal affect and thought content      [name] M. Daneil Dunker, MD 09/11/2023 2:31 PM

## 2023-09-11 NOTE — Patient Instructions (Signed)
 It was very nice to see you today!  We will refill your meloxicam  for your wrist.  It is okay for you to use the Voltaren as well.  Let us  know if not improving.  We will also send a prescription in for Augmentin .  Let us  know if not improving in the next 1 to 2 weeks.  No follow-ups on file.   Take care, Dr Daneil Dunker  PLEASE NOTE:  If you had any lab tests, please let us  know if you have not heard back within a few days. You may see your results on mychart before we have a chance to review them but we will give you a call once they are reviewed by us .   If we ordered any referrals today, please let us  know if you have not heard from their office within the next week.   If you had any urgent prescriptions sent in today, please check with the pharmacy within an hour of our visit to make sure the prescription was transmitted appropriately.   Please try these tips to maintain a healthy lifestyle:  Eat at least 3 REAL meals and 1-2 snacks per day.  Aim for no more than 5 hours between eating.  If you eat breakfast, please do so within one hour of getting up.   Each meal should contain half fruits/vegetables, one quarter protein, and one quarter carbs (no bigger than a computer mouse)  Cut down on sweet beverages. This includes juice, soda, and sweet tea.   Drink at least 1 glass of water with each meal and aim for at least 8 glasses per day  Exercise at least 150 minutes every week.

## 2023-09-13 ENCOUNTER — Encounter: Payer: Self-pay | Admitting: Internal Medicine

## 2023-09-13 ENCOUNTER — Ambulatory Visit: Payer: PPO | Admitting: Internal Medicine

## 2023-09-13 VITALS — BP 128/78 | HR 88 | Ht 70.0 in | Wt 231.8 lb

## 2023-09-13 DIAGNOSIS — E1159 Type 2 diabetes mellitus with other circulatory complications: Secondary | ICD-10-CM

## 2023-09-13 DIAGNOSIS — Z794 Long term (current) use of insulin: Secondary | ICD-10-CM | POA: Diagnosis not present

## 2023-09-13 DIAGNOSIS — E785 Hyperlipidemia, unspecified: Secondary | ICD-10-CM

## 2023-09-13 DIAGNOSIS — E042 Nontoxic multinodular goiter: Secondary | ICD-10-CM | POA: Diagnosis not present

## 2023-09-13 LAB — POCT GLYCOSYLATED HEMOGLOBIN (HGB A1C): Hemoglobin A1C: 7 % — AB (ref 4.0–5.6)

## 2023-09-13 MED ORDER — METFORMIN HCL 1000 MG PO TABS: 1000.0000 mg | ORAL_TABLET | Freq: Two times a day (BID) | ORAL | 3 refills | Status: AC

## 2023-09-13 MED ORDER — EMPAGLIFLOZIN 25 MG PO TABS
25.0000 mg | ORAL_TABLET | Freq: Every day | ORAL | 3 refills | Status: AC
Start: 2023-09-13 — End: ?

## 2023-09-13 MED ORDER — TOUJEO MAX SOLOSTAR 300 UNIT/ML ~~LOC~~ SOPN: 70.0000 [IU] | PEN_INJECTOR | Freq: Every day | SUBCUTANEOUS | 3 refills | Status: AC

## 2023-09-13 NOTE — Addendum Note (Signed)
 Addended by: Vernon Goodpasture on: 09/13/2023 10:09 AM   Modules accepted: Orders

## 2023-09-13 NOTE — Progress Notes (Signed)
 Zachary AasxtrosPatient ID: Zachary Warner, male   DOB: [birth year], 68 y.o.   MRN: [id number]  HPI: Zachary Warner is a 68 y.o.-year-old male, presenting for f/u for DM2, dx in ~1995, insulin -dependent since ~2005, uncontrolled, with complications (CAD - s/p AMI, DR). Last visit 4 months ago.  Interim history: No increased urination, blurry vision, nausea, chest pain.   He has right wrist pain. On Voltaren gel. He fell >> scraped his shin. He had a tetanus shot. His wife is taking care of it (she is a wound care nurse). He had another episode of diverticulitis >> on ABx: Augmentin .  DM2:  Reviewed HbA1c levels: Lab Results  Component Value Date   HGBA1C 7.7 (H) 04/27/2023   HGBA1C 6.5 (A) 12/22/2022   HGBA1C 6.9 (A) 08/18/2022  07/22/2020: HbA1c calculated from fructosamine is 6.16%, excellent. 03/31/2020: HbA1c calculated from fructosamine is at goal, at 6.1%. 11/25/2019: HbA1c calculated from fructosamine is 6.2% 01/10/2019: HbA1c calculated from fructosamine 6.35% 04/09/2017: HbA1c calculated from fructosamine is 6.8% 11/28/2017:  Hba1c calculated from fructosamine is better: 6.45% 07/11/2017: HbA1c calculated from the fructosamine is 6.6%. 03/08/2017: HbA1c calculated from the fructosamine is 6.5%. 11/06/2016: HbA1c calculated from the fructosamine is slightly higher, at 6.6% 07/05/2016: HbA1c calculated from the fructosamine is 6.2% 04/05/2016: HbA1c calculated from fructosamine is excellent, at 6% 01/04/2016: HbA1c calculated from fructosamine is  6.55%. 11/10/2015: HbA1c calculated from fructosamine is 5.8%. 08/10/2015: HbA1c calculated from fructosamine is 7.05%. 04/09/2015: HbA1c calculated fructosamine (326): 7.1%, which is much more concordant with his sugar log.   He is on: - Metformin  1000 mg 2x a day. - Jardiance  25 mg daily before breakfast - Ozempic  0.5 mg weekly-added 07/2019 >> 1 mg weekly - Tresiba  U200  >> Toujeo  80 units daily - Humalog : 25-30-40 units before meals >>  25-30-35 (36-38) units before meals >> - 25 units before b'fast - 25-28 >> 20-25 units before lunch  - 30-32 >> 20-25 >> actually using 30-36 units before dinner!! We stopped Januvia  2/2 large doses of mealtime insulin . He initially had yeast infections with Jardiance , but now controlled with Azo.  He checks his sugars 1x a day: - am: 83-112 >> 104-146 >> 81-127, 139 >> 69-121, 198 >> 68-120, 132, 140 - 2h after b'fast: 148 >> n/c >> 103-179 >> 165 >> 99-119 >> 156 - lunch:  130-177 >> 138, 147 >> n/c >> 67 >> n/c >> 132, 148 >> 142 - 2h after lunch: 124, 142, 260 >> 106, 120, 222, 266 >> n/c>> 122 - dinner: 125 >> n/c >> 120-139 >> 72, 130, 147 >> 89 >> n/c - 2h after dinner: 148-189, 300 >> 200 >> 160, 163 >> n/c - bedtime: 169, 194 >> see below >> 72-164 >> 67, 72 >> n/c - nighttime:  67 x2, 71, 79, 143 >> 56, 63, 70, 74, 83 >> 176 >> n/c Low sugar: 50 - at night ... >>  56x 1, 63 >> 69 >> 59; has hypoglycemia awareness in the 60s. Highest sugar was 368 ...>> 169 >> 190 >> 156.  He has a ReliOn meter.  Pt's meals are: - Breakfast: grits or sandwich - Lunch: sandwich - Dinner: chicken or beef or pork + vegetables - Snacks: 1 or 2 snacks  - crackers, peanuts or cookies  No CKD, last BUN/creatinine:  Lab Results  Component Value Date   BUN 19 04/27/2023   CREATININE 0.94 04/27/2023   Lab Results  Component Value Date   MICRALBCREAT 0.8 12/22/2022  MICRALBCREAT 1.0 04/07/2022   MICRALBCREAT 0.8 02/04/2016   MICRALBCREAT 0.6 07/08/2013   MICRALBCREAT 0.6 03/07/2013   MICRALBCREAT 13.7 05/29/2006  On lisinopril .  + HL; last set of lipids: Lab Results  Component Value Date   CHOL 121 12/22/2022   HDL 33.10 (L) 12/22/2022   LDLCALC 56 12/22/2022   LDLDIRECT 52.0 10/12/2022   TRIG 158.0 (H) 12/22/2022   CHOLHDL 4 12/22/2022  On Lipitor 40 mg daily and fish oil  1000 mg 1x a day.  - Latest eye exam: 06/2022: No DR reportedly, prev.+ DR. He saw Dr. Toma Founds - he  passed away.  - no numbness and tingling in his feet. Last foot exam: 12/22/2022.  He also has a history of HTN, h/o nephrolithiasis x 3, last in 03/2013, OSA.   He had an MI 07/2014 >> had a stent placed. Cardiologist: Dr Krosowski. He is on supplementation with B12. He had heel pain, improved after starting to use special socks and using a TENS unit-Plantar fasciitis.  He is retired and works only 3 days a week (10 am to 4 pm).  He loves it.  Thyroid  nodules:  Thyroid  U/S (01/22/2019): Several nodules, of which 2 were dominant: Parenchymal Echotexture: Moderately heterogenous Isthmus: 1.0 cm Right lobe: 5.4 x 2.0 x 1.8 cm Left lobe: 4.7 x 2.4 x 2.0 cm _____________________________________   Estimated total number of nodules >/= 1 cm: 5 _____________________________________   Nodule # 1: Location: Isthmus; Mid Maximum size: 1.9 cm; Other 2 dimensions: 1.3 x 1.7 cm Composition: cannot determine (2) Echogenicity: hypoechoic (2) Echogenic foci: macrocalcifications (1) ACR TI-RADS total points: 5.  **Given size (>/= 1.5 cm) and appearance, fine needle aspiration of this moderately suspicious nodule should be considered based on TI-RADS criteria. _________________________________________________________   Nodule # 2: Location: Right; Superior Maximum size: 1.0 cm; Other 2 dimensions: 0.6 x 0.7 cm Composition: solid/almost completely solid (2) Echogenicity: hypoechoic (2) Echogenic foci: punctate echogenic foci (3) ACR TI-RADS total points: 7.  **Given size (>/= 1.0 cm) and appearance, fine needle aspiration of this highly suspicious nodule should be considered based on TI-RADS criteria. _________________________________________________________   Nodule #3 is a predominantly cystic nodule the mid right thyroid  lobe that measures 1.1 x 0.8 x 0.8 cm.   Nodule #4 is a cystic nodule in the superior left thyroid  lobe that measures 1.5 x 0.9 x 1.0 cm.   Nodule #5 is a mildly  complex cystic nodule with septations in the mid left thyroid  lobe that measures 1.2 x 1.0 x 1.3 cm.   Nodule # 6: Location: Left; Mid Maximum size: 0.8 cm; Other 2 dimensions: 0.6 x 0.5 cm Composition: mixed cystic and solid (1) Echogenicity: isoechoic (1) Shape: taller-than-wide (3) ACR TI-RADS total points: 5. Given size (<0.9 cm) and appearance, this nodule does NOT meet TI-RADS criteria for biopsy or dedicated follow-up. _______________________________________   IMPRESSION: 1. Multinodular goiter. 2. Nodule #1 in the isthmus and nodule #2 in the superior right thyroid  lobe both meet criteria for ultrasound-guided biopsy. 3. Multiple cystic nodules that do not meet criteria for biopsy.   Biopsies of the dominant nodules (02/13/2019): Benign:  Clinical History: Nodule 1 Isthmus Mid 1.9 cm; Other 2 dimensions: 1.3 x  1.7 cm, Hypoechoic, ACR TI-RADS total points: 5, Moderately suspicious  nodule  Specimen Submitted:  A. THYROID  ISTHMUS, FINE NEEDLE ASPIRATION:   FINAL MICROSCOPIC DIAGNOSIS:  - Consistent with benign follicular nodule (Bethesda category II)   SPECIMEN ADEQUACY:  Satisfactory for evaluation  CYTOLOGY - NON PAP  CASE: [id number]  PATIENT: Kalel Letts  Non-Gynecological Cytology Report   Clinical History: Nodule 2 Right Superior 1.0 cm; Other 2 dimensions:  0.6 x 0.7 cm, Solid almost completely solid, Hypoechoic, ACR TI-RADS  total points: 7, Highly suspicious  nodule  Specimen Submitted:  A. THYROID , RT LOBE RUP, FINE NEEDLE ASPIRATION:   FINAL MICROSCOPIC DIAGNOSIS:  - Consistent with benign follicular nodule (Bethesda category II)   SPECIMEN ADEQUACY:  Satisfactory for evaluation  Thyroid  ultrasound (04/20/2020):  Parenchymal Echotexture: Moderately heterogeneous Isthmus: 0.8 cm Right lobe: 4.4 x 2.0 x 1.6 cm Left lobe: 4.8 x 2.0 x 1.8 cm _________________________________________________________  Nodule 1: 1.1 x 1.1 x 0.7 cm nodule located  in the isthmus has decreased in size since prior examination where it measured 1.9 x 1.7 x 1.3 cm. FNA of this nodule was performed on 02/12/2019. Please correlate with results.  Nodule 2: 1.0 x 0.9 x 0.6 cm nodule located in the superior right thyroid  lobe is not significantly changed in size measuring 1.0 x 0.7 x 0.6 cm on the prior exam. FNA of this nodule was performed on 02/12/2019. Please correlate with results.  Nodule 3: 1.2 x 0.9 x 0.7 cm cystic nodule located in the mid right thyroid  lobe is not significantly changed in size since prior exam where it measured 1.1 x 0.8 x 0.8 cm. It does not meet criteria for FNA or imaging follow-up.  Nodule 4: 0.6 x 0.6 x 0.3 cm solid hypoechoic nodule in the inferior right thyroid  lobe is new since the prior examination. It does not meet criteria for FNA or imaging follow-up.  Nodule 5: 1.6 x 0.9 x 0.8 cm cystic nodule in the superior left thyroid  lobe is not significantly changed in size since prior examination when where measured 1.5 x 1.0 x 0.9 cm. This does not meet criteria for FNA or imaging follow-up.  Nodule 6: 0.8 x 0.7 x 0.6 cm solid isoechoic nodule in the mid left thyroid  lobe previously measured 1.2 x 1.0 x 1.3 cm. Interval decrease in size favors a benign etiology. This nodule does not meet criteria for imaging follow-up or FNA. _________________________________________________________  IMPRESSION: Bilateral thyroid  nodules. Nodules 1 and 2 were previously biopsied. Please correlate with results. Remaining nodules do not meet criteria for FNA or imaging follow-up.  Thyroid  U/S (05/08/2022): Parenchymal Echotexture: Moderately heterogenous  Isthmus: 0.9 cm, previously 0.8 cm  Right lobe: 4.7 x 1.8 x 1.5 cm, previously 4.4 x 2.0 x 1.6 cm  Left lobe: 4.3 x 1.9 x 1.9 cm, previously 4.8 x 2.0 x 1.8 cm  _________________________________________________________   Estimated total number of nodules >/= 1 cm:  2 _________________________________________________________   Nodule #1 in the isthmus is heterogeneous but hypoechoic. Nodule measures 1.1 x 0.9 x 1.3 cm and previously measured 1.1 x 0.7 x 1.1 cm. Morphology of the nodule is not significantly changed. This represents the previously biopsied nodule.   Nodul #2 in the superior right thyroid  lobe is a hypoechoic nodule with at least 1 small echogenic focus. This represents the previously biopsied nodule and measures 1.1 x 0.6 x 0.8 cm and previously measured 1.0 x 0.6 x 0.9 cm. Morphology of the nodule is stable.   Multiple bilateral thyroid  nodules.   Nodule #5 in the left mid thyroid  lobe is a predominantly isoechoic solid nodule with small cystic component. This nodule measures 0.9 x 0.6 x 0.7 cm. This is compatible with a TR 3 nodule and does not meet  criteria for biopsy or dedicated follow-up.   IMPRESSION: 1. Bilateral thyroid  nodules. 2. Previously biopsied nodules are stable. The other nodules do not meet criteria for biopsy or dedicated follow-up.  Pt denies: - feeling nodules in neck - hoarseness - dysphagia - choking  Latest TSH was normal: Lab Results  Component Value Date   TSH 0.99 04/27/2023   ROS: + See HPI  I reviewed pt's medications, allergies, PMH, social hx, family hx, and changes were documented in the history of present illness. Otherwise, unchanged from my initial visit note.  Past Medical History:  Diagnosis Date   Allergy    Diabetes mellitus    Hyperlipidemia    Hypertension    Myocardial infarction [facility])    May 2016   Nephrolithiasis    3x 2007-2015, passed on his own   Sleep apnea    wears CPAP   Past Surgical History:  Procedure Laterality Date   CARDIAC CATHETERIZATION     stent May 2016   COLONOSCOPY     heart stent     May 2016   HERNIA REPAIR     Social History   Socioeconomic History   Marital status: Married    Spouse name: Not on file   Number of children: Not on  file   Years of education: Not on file   Highest education level: 12th grade  Occupational History   Not on file  Tobacco Use   Smoking status: Never   Smokeless tobacco: Never  Substance and Sexual Activity   Alcohol use: No   Drug use: No   Sexual activity: Never  Other Topics Concern   Not on file  Social History Narrative   Married. 2 children son and daughter. Granddaughter 12/02/2014 born and grandson 7/17//2018.       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      Hobbies: golfing (low to mid 63s)   Social Drivers of Corporate investment banker Strain: Low Risk  (09/10/2023)   Overall Financial Resource Strain (CARDIA)    Difficulty of Paying Living Expenses: Not very hard  Food Insecurity: No Food Insecurity (09/10/2023)   Hunger Vital Sign    Worried About Running Out of Food in the Last Year: Never true    Ran Out of Food in the Last Year: Never true  Transportation Needs: No Transportation Needs (09/10/2023)   PRAPARE - Administrator, Civil Service (Medical): No    Lack of Transportation (Non-Medical): No  Physical Activity: Insufficiently Active (09/10/2023)   Exercise Vital Sign    Days of Exercise per Week: 2 days    Minutes of Exercise per Session: 30 min  Stress: No Stress Concern Present (09/10/2023)   [employer] of Occupational Health - Occupational Stress Questionnaire    Feeling of Stress: Only a little  Social Connections: Socially Integrated (09/10/2023)   Social Connection and Isolation Panel    Frequency of Communication with Friends and Family: More than three times a week    Frequency of Social Gatherings with Friends and Family: Twice a week    Attends Religious Services: More than 4 times per year    Active Member of [employer] or Organizations: Yes    Attends Banker Meetings: More than 4 times per year    Marital Status: Married  Catering manager Violence: Not At Risk (08/22/2022)    Humiliation, Afraid, Rape, and Kick questionnaire  Fear of Current or Ex-Partner: No    Emotionally Abused: No    Physically Abused: No    Sexually Abused: No   Current Outpatient Medications on File Prior to Visit  Medication Sig Dispense Refill   amoxicillin -clavulanate (AUGMENTIN ) 875-125 MG tablet Take 1 tablet by mouth 2 (two) times daily. 14 tablet 0   aspirin 81 MG tablet Take 81 mg by mouth daily.     atorvastatin  (LIPITOR) 40 MG tablet Take 1 tablet (40 mg total) by mouth daily. 90 tablet 3   carvedilol  (COREG ) 3.125 MG tablet Take 1 tablet (3.125 mg total) by mouth 2 times daily. 60 tablet 3   EQ FIBER SUPPLEMENT PO Take 1 tablet by mouth daily. Unknown strength     fluticasone (FLONASE) 50 MCG/ACT nasal spray Place 1 spray into both nostrils daily as needed for allergies or rhinitis.     insulin  glargine, 2 Unit Dial , (TOUJEO  MAX SOLOSTAR) 300 UNIT/ML Solostar Pen Inject 80 Units into the skin daily. 24 mL 3   insulin  lispro (HUMALOG  KWIKPEN) 100 UNIT/ML KwikPen INJECT 20-25 UNITS SUBCUTANEOUSLY THREE TIMES DAILY     JARDIANCE  25 MG TABS tablet TAKE ONE TABLET (25mg ) BY MOUTH DAILY 90 tablet 5   lisinopril  (ZESTRIL ) 40 MG tablet Take 1 tablet (40 mg total) by mouth daily. 90 tablet 3   meloxicam  (MOBIC ) 15 MG tablet Take 1 tablet (15 mg total) by mouth daily. 30 tablet 0   metFORMIN  (GLUCOPHAGE ) 1000 MG tablet Take 1 tablet (1,000 mg total) by mouth 2 (two) times daily with a meal. 180 tablet 3   nitroGLYCERIN  (NITROSTAT ) 0.4 MG SL tablet PLACE 1 TABLET UNDER TONGUE EVERY 5 MINUTES AS NEEDED FOR CHEST PAIN. CALL 911/DOCTOR IF YOU TAKE 2 TABLETS. max of 3 tablets/day. 25 tablet 3   Omega-3 Fatty Acids (FISH OIL) 1000 MG CAPS Take 4 capsules by mouth daily.     OZEMPIC , 1 MG/DOSE, 4 MG/3ML SOPN Inject 1 mg into the skin once a week. 9 mL 3   vitamin B-12 (CYANOCOBALAMIN ) 500 MCG tablet Take 500 mcg by mouth daily.     No current facility-administered medications on file prior  to visit.   Allergies  Allergen Reactions   Invokana  [Canagliflozin ] Other (See Comments)    Yeast infection   Family History  Problem Relation Age of Onset   Coronary artery disease Father    Liver disease Father        from heart meds per pt   Diabetes Sister        grandmother   Kidney disease Sister        dialysis from dm   Diabetes Sister    Other Sister        flash pulmonary edema led to death   Colon cancer Neg Hx    Esophageal cancer Neg Hx    Rectal cancer Neg Hx    Stomach cancer Neg Hx    PE: There were no vitals taken for this visit.  Wt Readings from Last 3 Encounters:  09/11/23 231 lb 6.4 oz (105 kg)  08/01/23 236 lb (107 kg)  05/03/23 236 lb 12.8 oz (107.4 kg)   Constitutional: overweight, in NAD Eyes: EOMI, no exophthalmos ENT:no thyromegaly but left thyroid  nodule felt on palpation, no cervical lymphadenopathy Cardiovascular: RRR, No MRG Respiratory: CTA B Musculoskeletal: no deformities Skin: no rashes Neurological: no tremor with outstretched hands  ASSESSMENT: 1. DM2, insulin -dependent, controlled, with complications - CAD, s/p AMi - DR  He  does not have a history of pancreatitis or family history of medullary thyroid  cancer.    2. HL  3.Thyroid  nodules  PLAN:  1. Patient with longstanding, fairly well-controlled type 2 diabetes, on a complex medication regimen with oral medications: Metformin  and Jardiance , and also basal-bolus insulin  regimen and weekly GLP-1 receptor agonist with higher blood sugars at last visit, with an HbA1c returned 7.7%, increased from 6.5%.  He mentions that after reducing the doses of Humalog  before lunch and dinner, he had not seen as many lows as before.  He had occasional mild high blood sugars later in the day but the majority of the sugars were well-controlled.  He had significant stress before last visit with his mother dying and some of the higher blood sugars could have been related to grieving.  At that  time, I did not suggest a change in regimen. -Today's visit, he does have some low blood sugars overnight, down to 59, and also in the morning, down to the 60s.  However, upon questioning, he is taking a higher dose of Humalog  before dinner than recommended (34-36 units) and I advised him to reduce this to 20-25 units.  Sugars in the rest of the day appears to be mostly at goal so I recommended to continue the other dose of Humalog .  However, to avoid further drops during the night, I also advised him to reduce the Toujeo  dose.  I refilled his Toujeo , Jardiance , and metformin . - I suggested to:  Patient Instructions  Please continue: - Metformin  1000 mg 2x a day. - Jardiance  25 mg daily before breakfast  - Ozempic  1 mg weekly  Decrease: - Humalog : - 25 units before b'fast - 20-25 units before lunch - 20-25 units before dinner - Toujeo  70 units daily   Please return in 3-4 months with your sugar log.  - we checked his HbA1c: 7.0% (better) - advised to check sugars at different times of the day - 4x a day, rotating check times - advised for yearly eye exams >> he is not UTD - he will schedule annual eye exam; will need to change his ophthalmologist - wi check an ACR todayll - return to clinic in 3-4 months  2. HL - Reviewed latest lipid panel and this showed an LDL at goal and a slightly low HDL: Lab Results  Component Value Date   CHOL 121 12/22/2022   HDL 33.10 (L) 12/22/2022   LDLCALC 56 12/22/2022   LDLDIRECT 52.0 10/12/2022   TRIG 158.0 (H) 12/22/2022   CHOLHDL 4 12/22/2022  -He continues on atorvastatin  40 mg daily and fish oil 1000 mg daily without side effects  3.  Thyroid  nodules - No neck compression symptoms - TSH was normal: Lab Results  Component Value Date   TSH 0.99 04/27/2023  -The thyroid  ultrasound report from 03/2020 showed 2 dominant nodules, which were biopsied with benign results.  The rest of the nodules were small and not worrisome. - We obtained  another thyroid  ultrasound in 04/2022 which showed stable nodules - Will continue to follow him clinically for now  Emilie Harden, MD PhD [facility] Endocrinology

## 2023-09-13 NOTE — Patient Instructions (Addendum)
 Please continue: - Metformin  1000 mg 2x a day. - Jardiance  25 mg daily before breakfast  - Ozempic  1 mg weekly  Decrease: - Humalog : - 25 units before b'fast - 20-25 units before lunch - 20-25 units before dinner - Toujeo  70 units daily   Please return in 3-4 months with your sugar log.

## 2023-09-14 ENCOUNTER — Ambulatory Visit: Payer: Self-pay | Admitting: Internal Medicine

## 2023-09-14 LAB — MICROALBUMIN / CREATININE URINE RATIO
Creatinine, Urine: 123 mg/dL (ref 20–320)
Microalb Creat Ratio: 7 mg/g{creat} (ref <30)
Microalb, Ur: 0.8 mg/dL

## 2023-09-19 ENCOUNTER — Other Ambulatory Visit: Payer: Self-pay | Admitting: Family Medicine

## 2023-10-06 ENCOUNTER — Other Ambulatory Visit: Payer: Self-pay | Admitting: Family Medicine

## 2023-11-01 ENCOUNTER — Ambulatory Visit

## 2023-11-01 VITALS — Ht 70.0 in | Wt 228.0 lb

## 2023-11-01 DIAGNOSIS — Z0000 Encounter for general adult medical examination without abnormal findings: Secondary | ICD-10-CM

## 2023-11-01 NOTE — Patient Instructions (Signed)
 Mr. Manalang , Thank you for taking time out of your busy schedule to complete your Annual Wellness Visit with me. I enjoyed our conversation and look forward to speaking with you again next year. I, as well as your care team,  appreciate your ongoing commitment to your health goals. Please review the following plan we discussed and let me know if I can assist you in the future. Your Game plan/ To Do List    Referrals: If you haven't heard from the office you've been referred to, please reach out to them at the phone provided.   Follow up Visits: We will see or speak with you next year for your Next Medicare AWV with our clinical staff Have you seen your provider in the last 6 months (3 months if uncontrolled diabetes)? No  Clinician Recommendations:  Aim for 30 minutes of exercise or brisk walking, 6-8 glasses of water, and 5 servings of fruits and vegetables each day.       This is a list of the screenings recommended for you:  Health Maintenance  Topic Date Due   Eye exam for diabetics  08/30/2021   COVID-19 Vaccine (9 - 2024-25 season) 04/06/2023   Medicare Annual Wellness Visit  08/22/2023   Flu Shot  10/26/2023   Hepatitis C Screening  03/24/2098*   Complete foot exam   12/22/2023   Hemoglobin A1C  03/14/2024   Yearly kidney function blood test for diabetes  04/26/2024   Yearly kidney health urinalysis for diabetes  09/12/2024   Colon Cancer Screening  04/28/2026   DTaP/Tdap/Td vaccine (3 - Td or Tdap) 08/12/2033   Pneumococcal Vaccine for age over 109  Completed   Zoster (Shingles) Vaccine  Completed   Hepatitis B Vaccine  Aged Out   HPV Vaccine  Aged Out   Meningitis B Vaccine  Aged Out  *Topic was postponed. The date shown is not the original due date.    Advanced directives: (Declined) Advance directive discussed with you today. Even though you declined this today, please call our office should you change your mind, and we can give you the proper paperwork for you to fill  out. Advance Care Planning is important because it:  [x]  Makes sure you receive the medical care that is consistent with your values, goals, and preferences  [x]  It provides guidance to your family and loved ones and reduces their decisional burden about whether or not they are making the right decisions based on your wishes.  Follow the link provided in your after visit summary or read over the paperwork we have mailed to you to help you started getting your Advance Directives in place. If you need assistance in completing these, please reach out to us  so that we can help you!  See attachments for Preventive Care and Fall Prevention Tips.

## 2023-11-01 NOTE — Progress Notes (Signed)
 Subjective:   Zachary Warner is a 68 y.o. who presents for a Medicare Wellness preventive visit.  As a reminder, Annual Wellness Visits don't include a physical exam, and some assessments may be limited, especially if this visit is performed virtually. We may recommend an in-person follow-up visit with your provider if needed.  Visit Complete: Virtual I connected with  Zachary Warner on 11/01/23 by a audio enabled telemedicine application and verified that I am speaking with the correct person using two identifiers.  Patient Location: Home  Provider Location: Office/Clinic  I discussed the limitations of evaluation and management by telemedicine. The patient expressed understanding and agreed to proceed.  Vital Signs: Because this visit was a virtual/telehealth visit, some criteria may be missing or patient reported. Any vitals not documented were not able to be obtained and vitals that have been documented are patient reported.  VideoDeclined- This patient declined Librarian, academic. Therefore the visit was completed with audio only.  Persons Participating in Visit: Patient.  AWV Questionnaire: Yes: Patient Medicare AWV questionnaire was completed by the patient on 10/29/23; I have confirmed that all information answered by patient is correct and no changes since this date.  Cardiac Risk Factors include: advanced age (>52men, >55 women);diabetes mellitus;dyslipidemia;male gender;hypertension;obesity (BMI >30kg/m2)     Objective:    Today's Vitals   11/01/23 0803  Weight: 228 lb (103.4 kg)  Height: 5' 10 (1.778 m)   Body mass index is 32.71 kg/m.     11/01/2023    8:06 AM 08/22/2022    9:09 AM 08/08/2021   11:37 AM 04/28/2016   10:22 AM 04/17/2016    2:04 PM  Advanced Directives  Does Patient Have a Medical Advance Directive? No No No No  No   Would patient like information on creating a medical advance directive? No - Patient declined No - Patient  declined No - Patient declined       Data saved with a previous flowsheet row definition    Current Medications (verified) Outpatient Encounter Medications as of 11/01/2023  Medication Sig   amoxicillin -clavulanate (AUGMENTIN ) 875-125 MG tablet Take 1 tablet by mouth 2 (two) times daily.   aspirin 81 MG tablet Take 81 mg by mouth daily.   atorvastatin  (LIPITOR) 40 MG tablet Take 1 tablet (40 mg total) by mouth daily.   BOOSTRIX 5-2.5-18.5 LF-MCG/0.5 injection    carvedilol  (COREG ) 3.125 MG tablet Take 1 tablet (3.125 mg total) by mouth 2 times daily.   empagliflozin  (JARDIANCE ) 25 MG TABS tablet Take 1 tablet (25 mg total) by mouth daily.   EQ FIBER SUPPLEMENT PO Take 1 tablet by mouth daily. Unknown strength   fluticasone (FLONASE) 50 MCG/ACT nasal spray Place 1 spray into both nostrils daily as needed for allergies or rhinitis.   insulin  glargine, 2 Unit Dial , (TOUJEO  MAX SOLOSTAR) 300 UNIT/ML Solostar Pen Inject 70 Units into the skin daily.   insulin  lispro (HUMALOG  KWIKPEN) 100 UNIT/ML KwikPen INJECT 20-25 UNITS SUBCUTANEOUSLY THREE TIMES DAILY   lisinopril  (ZESTRIL ) 40 MG tablet Take 1 tablet (40 mg total) by mouth daily.   meloxicam  (MOBIC ) 15 MG tablet Take 1 tablet (15 mg total) by mouth daily.   metFORMIN  (GLUCOPHAGE ) 1000 MG tablet Take 1 tablet (1,000 mg total) by mouth 2 (two) times daily with a meal.   nitroGLYCERIN  (NITROSTAT ) 0.4 MG SL tablet PLACE 1 TABLET UNDER TONGUE EVERY 5 MINUTES AS NEEDED FOR CHEST PAIN. CALL 911/DOCTOR IF YOU TAKE 2 TABLETS. max  of 3 tablets/day.   Omega-3 Fatty Acids (FISH OIL) 1000 MG CAPS Take 4 capsules by mouth daily.   OZEMPIC , 1 MG/DOSE, 4 MG/3ML SOPN Inject 1 mg into the skin once a week.   vitamin B-12 (CYANOCOBALAMIN ) 500 MCG tablet Take 500 mcg by mouth daily.   No facility-administered encounter medications on file as of 11/01/2023.    Allergies (verified) Invokana  [canagliflozin ]   History: Past Medical History:  Diagnosis Date    Allergy    Diabetes mellitus    Hyperlipidemia    Hypertension    Myocardial infarction [facility]    May 2016   Nephrolithiasis    3x 2007-2015, passed on his own   Sleep apnea    wears CPAP   Past Surgical History:  Procedure Laterality Date   CARDIAC CATHETERIZATION     stent May 2016   COLONOSCOPY     heart stent     May 2016   HERNIA REPAIR     Family History  Problem Relation Age of Onset   Coronary artery disease Father    Liver disease Father        from heart meds per pt   Diabetes Sister        grandmother   Kidney disease Sister        dialysis from dm   Diabetes Sister    Other Sister        flash pulmonary edema led to death   Colon cancer Neg Hx    Esophageal cancer Neg Hx    Rectal cancer Neg Hx    Stomach cancer Neg Hx    Social History   Socioeconomic History   Marital status: Married    Spouse name: Not on file   Number of children: Not on file   Years of education: Not on file   Highest education level: 12th grade  Occupational History   Not on file  Tobacco Use   Smoking status: Never   Smokeless tobacco: Never  Substance and Sexual Activity   Alcohol use: No   Drug use: No   Sexual activity: Never  Other Topics Concern   Not on file  Social History Narrative   Married. 2 children son and daughter. Granddaughter 12/02/2014 born and grandson 7/17//2018.       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      Hobbies: golfing (low to mid 81s)   Social Drivers of Corporate investment banker Strain: Low Risk  (11/01/2023)   Overall Financial Resource Strain (CARDIA)    Difficulty of Paying Living Expenses: Not hard at all  Food Insecurity: No Food Insecurity (11/01/2023)   Hunger Vital Sign    Worried About Running Out of Food in the Last Year: Never true    Ran Out of Food in the Last Year: Never true  Transportation Needs: No Transportation Needs (11/01/2023)   PRAPARE - Scientist, research (physical sciences) (Medical): No    Lack of Transportation (Non-Medical): No  Physical Activity: Sufficiently Active (11/01/2023)   Exercise Vital Sign    Days of Exercise per Week: 5 days    Minutes of Exercise per Session: 30 min  Recent Concern: Physical Activity - Insufficiently Active (09/10/2023)   Exercise Vital Sign    Days of Exercise per Week: 2 days    Minutes of Exercise per Session: 30 min  Stress: No Stress Concern Present (11/01/2023)   [place]  [employer] - Occupational Stress Questionnaire    Feeling of Stress: Not at all  Social Connections: Moderately Integrated (11/01/2023)   Social Connection and Isolation Panel    Frequency of Communication with Friends and Family: More than three times a week    Frequency of Social Gatherings with Friends and Family: More than three times a week    Attends Religious Services: More than 4 times per year    Active Member of [employer] or Organizations: No    Attends Engineer, structural: Never    Marital Status: Married    Tobacco Counseling Counseling given: Not Answered    Clinical Intake:  Pre-visit preparation completed: Yes  Pain : No/denies pain     BMI - recorded: 32.71 Nutritional Status: BMI > 30  Obese Nutritional Risks: None Diabetes: Yes CBG done?: No Did pt. bring in CBG monitor from home?: No  Lab Results  Component Value Date   HGBA1C 7.0 (A) 09/13/2023   HGBA1C 7.7 (H) 04/27/2023   HGBA1C 6.5 (A) 12/22/2022     How often do you need to have someone help you when you read instructions, pamphlets, or other written materials from your doctor or pharmacy?: 1 - Never  Interpreter Needed?: No  Information entered by :: Ellouise Haws, LPN   Activities of Daily Living     10/29/2023    6:46 PM  In your present state of health, do you have any difficulty performing the following activities:  Hearing? 1  Comment OTC hearing aids  Vision? 0  Difficulty concentrating or making  decisions? 0  Walking or climbing stairs? 0  Dressing or bathing? 0  Doing errands, shopping? 0  Preparing Food and eating ? N  Using the Toilet? N  In the past six months, have you accidently leaked urine? N  Do you have problems with loss of bowel control? N  Managing your Medications? N  Managing your Finances? N  Housekeeping or managing your Housekeeping? N    Patient Care Team: Katrinka Garnette KIDD, MD as PCP - General (Family Medicine) Trixie File, MD as Consulting Physician (Internal Medicine) Sheffield, Reyes BROCKS, MD as Referring Physician (Internal Medicine)  I have updated your Care Teams any recent Medical Services you may have received from other providers in the past year.     Assessment:   This is a routine wellness examination for Jadin.  Hearing/Vision screen Hearing Screening - Comments:: Pt has over the counter hearing aids    Goals Addressed             This Visit's Progress    Patient Stated       Lower sugar and weight loss        Depression Screen     11/01/2023    8:07 AM 09/11/2023    2:09 PM 08/01/2023    7:41 AM 04/27/2023    9:08 AM 10/12/2022    8:57 AM 08/22/2022    9:08 AM 08/08/2021   11:37 AM  PHQ 2/9 Scores  PHQ - 2 Score 0 0 0 0 0 0 0  PHQ- 9 Score    2 5      Fall Risk     10/29/2023    6:46 PM 09/11/2023    2:09 PM 08/01/2023    7:41 AM 04/27/2023    9:07 AM 10/12/2022    8:57 AM  Fall Risk   Falls in the past year? 0 0 0 0 1  Number  falls in past yr: 0 0 0 0 0  Injury with Fall? 0 0 0 0 0  Risk for fall due to : No Fall Risks No Fall Risks No Fall Risks No Fall Risks History of fall(s)  Follow up Falls prevention discussed   Falls evaluation completed Falls evaluation completed    MEDICARE RISK AT HOME:  Medicare Risk at Home Any stairs in or around the home?: (Patient-Rptd) Yes If so, are there any without handrails?: (Patient-Rptd) No Home free of loose throw rugs in walkways, pet beds, electrical cords, etc?:  (Patient-Rptd) Yes Adequate lighting in your home to reduce risk of falls?: (Patient-Rptd) Yes Life alert?: (Patient-Rptd) No Use of a cane, walker or w/c?: (Patient-Rptd) No Grab bars in the bathroom?: (Patient-Rptd) No Shower chair or bench in shower?: (Patient-Rptd) No Elevated toilet seat or a handicapped toilet?: (Patient-Rptd) Yes  TIMED UP AND GO:  Was the test performed?  No  Cognitive Function: 6CIT completed        11/01/2023    8:08 AM 08/22/2022    9:10 AM 08/08/2021   11:39 AM  6CIT Screen  What Year? 0 points 0 points 0 points  What month? 0 points 0 points 0 points  What time? 0 points 0 points 0 points  Count back from 20 0 points 0 points 0 points  Months in reverse 0 points 0 points 0 points  Repeat phrase 0 points 0 points 0 points  Total Score 0 points 0 points 0 points    Immunizations Immunization History  Administered Date(s) Administered   Fluad Quad(high Dose 65+) 02/23/2022   Influenza Split 04/17/2011   Influenza Whole 01/02/2007, 01/07/2009, 02/04/2010, 01/06/2012   Influenza, High Dose Seasonal PF 01/08/2021, 12/26/2022, 02/09/2023   Influenza, Quadrivalent, Recombinant, Inj, Pf 01/04/2019   Influenza,inj,Quad PF,6+ Mos 03/14/2013, 02/03/2014, 01/06/2015, 11/28/2017, 03/03/2020   Influenza-Unspecified 12/25/2012, 01/28/2016, 01/13/2017, 01/10/2022   Moderna Covid-19 Fall Seasonal Vaccine 36yrs & older 01/10/2022, 12/14/2022, 02/09/2023   PFIZER Comirnaty(Gray Top)Covid-19 Tri-Sucrose Vaccine 10/04/2020   PFIZER(Purple Top)SARS-COV-2 Vaccination 05/30/2019, 06/09/2019, 06/30/2019, 03/04/2020   PNEUMOCOCCAL CONJUGATE-20 04/04/2021   Pfizer Covid-19 Vaccine Bivalent Booster 38yrs & up 02/23/2022   Pneumococcal Conjugate-13 07/15/2013   Pneumococcal Polysaccharide-23 05/11/2008, 08/19/2014   Tdap 07/15/2013, 08/13/2023   Zoster Recombinant(Shingrix ) 02/24/2019, 08/26/2019    Screening Tests Health Maintenance  Topic Date Due   OPHTHALMOLOGY  EXAM  08/30/2021   COVID-19 Vaccine (9 - 2024-25 season) 04/06/2023   INFLUENZA VACCINE  10/26/2023   Hepatitis C Screening  03/24/2098 (Originally 08/07/1973)   FOOT EXAM  12/22/2023   HEMOGLOBIN A1C  03/14/2024   Diabetic kidney evaluation - eGFR measurement  04/26/2024   Diabetic kidney evaluation - Urine ACR  09/12/2024   Medicare Annual Wellness (AWV)  10/31/2024   Colonoscopy  04/28/2026   DTaP/Tdap/Td (3 - Td or Tdap) 08/12/2033   Pneumococcal Vaccine: 50+ Years  Completed   Zoster Vaccines- Shingrix   Completed   Hepatitis B Vaccines  Aged Out   HPV VACCINES  Aged Out   Meningococcal B Vaccine  Aged Out    Health Maintenance  Health Maintenance Due  Topic Date Due   OPHTHALMOLOGY EXAM  08/30/2021   COVID-19 Vaccine (9 - 2024-25 season) 04/06/2023   INFLUENZA VACCINE  10/26/2023   Health Maintenance Items Addressed: See Nurse Notes at the end of this note  Additional Screening:  Vision Screening: Recommended annual ophthalmology exams for early detection of glaucoma and other disorders of the eye. Would you like a  referral to an eye doctor? No    Dental Screening: Recommended annual dental exams for proper oral hygiene  Community Resource Referral / Chronic Care Management: CRR required this visit?  No   CCM required this visit?  No   Plan:    I have personally reviewed and noted the following in the patient's chart:   Medical and social history Use of alcohol, tobacco or illicit drugs  Current medications and supplements including opioid prescriptions. Patient is not currently taking opioid prescriptions. Functional ability and status Nutritional status Physical activity Advanced directives List of other physicians Hospitalizations, surgeries, and ER visits in previous 12 months Vitals Screenings to include cognitive, depression, and falls Referrals and appointments  In addition, I have reviewed and discussed with patient certain preventive protocols,  quality metrics, and best practice recommendations. A written personalized care plan for preventive services as well as general preventive health recommendations were provided to patient.   Ellouise VEAR Haws, LPN   03/28/7972   After Visit Summary: (MyChart) Due to this being a telephonic visit, the after visit summary with patients personalized plan was offered to patient via MyChart   Notes: Nothing significant to report at this time. Ophthalmology discussed pt will find a new provider

## 2023-11-02 ENCOUNTER — Ambulatory Visit: Payer: PPO | Admitting: Family Medicine

## 2023-11-02 ENCOUNTER — Ambulatory Visit: Payer: Self-pay | Admitting: Family Medicine

## 2023-11-02 ENCOUNTER — Encounter: Payer: Self-pay | Admitting: Family Medicine

## 2023-11-02 VITALS — BP 110/70 | HR 83 | Temp 97.4°F | Ht 70.0 in | Wt 227.8 lb

## 2023-11-02 DIAGNOSIS — M654 Radial styloid tenosynovitis [de Quervain]: Secondary | ICD-10-CM | POA: Diagnosis not present

## 2023-11-02 DIAGNOSIS — R0683 Snoring: Secondary | ICD-10-CM

## 2023-11-02 DIAGNOSIS — E785 Hyperlipidemia, unspecified: Secondary | ICD-10-CM

## 2023-11-02 DIAGNOSIS — M65311 Trigger thumb, right thumb: Secondary | ICD-10-CM

## 2023-11-02 DIAGNOSIS — Z7984 Long term (current) use of oral hypoglycemic drugs: Secondary | ICD-10-CM

## 2023-11-02 DIAGNOSIS — E1169 Type 2 diabetes mellitus with other specified complication: Secondary | ICD-10-CM

## 2023-11-02 DIAGNOSIS — I10 Essential (primary) hypertension: Secondary | ICD-10-CM | POA: Diagnosis not present

## 2023-11-02 DIAGNOSIS — I2510 Atherosclerotic heart disease of native coronary artery without angina pectoris: Secondary | ICD-10-CM

## 2023-11-02 DIAGNOSIS — E1159 Type 2 diabetes mellitus with other circulatory complications: Secondary | ICD-10-CM | POA: Diagnosis not present

## 2023-11-02 DIAGNOSIS — R400 Somnolence: Secondary | ICD-10-CM

## 2023-11-02 LAB — CBC WITH DIFFERENTIAL/PLATELET
Basophils Absolute: 0 K/uL (ref 0.0–0.1)
Basophils Relative: 0.5 % (ref 0.0–3.0)
Eosinophils Absolute: 0.2 K/uL (ref 0.0–0.7)
Eosinophils Relative: 3.8 % (ref 0.0–5.0)
HCT: 44 % (ref 39.0–52.0)
Hemoglobin: 14.6 g/dL (ref 13.0–17.0)
Lymphocytes Relative: 37.7 % (ref 12.0–46.0)
Lymphs Abs: 2 K/uL (ref 0.7–4.0)
MCHC: 33.1 g/dL (ref 30.0–36.0)
MCV: 87.1 fl (ref 78.0–100.0)
Monocytes Absolute: 0.4 K/uL (ref 0.1–1.0)
Monocytes Relative: 8.1 % (ref 3.0–12.0)
Neutro Abs: 2.7 K/uL (ref 1.4–7.7)
Neutrophils Relative %: 49.9 % (ref 43.0–77.0)
Platelets: 176 K/uL (ref 150.0–400.0)
RBC: 5.05 Mil/uL (ref 4.22–5.81)
RDW: 14.5 % (ref 11.5–15.5)
WBC: 5.3 K/uL (ref 4.0–10.5)

## 2023-11-02 LAB — COMPREHENSIVE METABOLIC PANEL WITH GFR
ALT: 17 U/L (ref 0–53)
AST: 15 U/L (ref 0–37)
Albumin: 4.4 g/dL (ref 3.5–5.2)
Alkaline Phosphatase: 48 U/L (ref 39–117)
BUN: 16 mg/dL (ref 6–23)
CO2: 27 meq/L (ref 19–32)
Calcium: 9.5 mg/dL (ref 8.4–10.5)
Chloride: 103 meq/L (ref 96–112)
Creatinine, Ser: 1.01 mg/dL (ref 0.40–1.50)
GFR: 76.56 mL/min (ref >60.00)
Glucose, Bld: 114 mg/dL — ABNORMAL HIGH (ref 70–99)
Potassium: 4.5 meq/L (ref 3.5–5.1)
Sodium: 140 meq/L (ref 135–145)
Total Bilirubin: 0.6 mg/dL (ref 0.2–1.2)
Total Protein: 7.1 g/dL (ref 6.0–8.3)

## 2023-11-02 LAB — LIPID PANEL
Cholesterol: 128 mg/dL (ref 0–200)
HDL: 36.2 mg/dL — ABNORMAL LOW (ref >39.00)
LDL Cholesterol: 55 mg/dL (ref 0–99)
NonHDL: 91.72
Total CHOL/HDL Ratio: 4
Triglycerides: 183 mg/dL — ABNORMAL HIGH (ref 0.0–149.0)
VLDL: 36.6 mg/dL (ref 0.0–40.0)

## 2023-11-02 LAB — TSH: TSH: 0.85 u[IU]/mL (ref 0.35–5.50)

## 2023-11-02 NOTE — Addendum Note (Signed)
 Addended by: KATRINKA GARNETTE KIDD on: 11/02/2023 10:09 AM   Modules accepted: Level of Service

## 2023-11-02 NOTE — Progress Notes (Signed)
 Phone [phone] In person visit   Subjective:   Zachary Warner is a 68 y.o. year old very pleasant male patient who presents for/with See problem oriented charting Chief Complaint  Patient presents with   Medical Management of Chronic Issues   Hyperlipidemia   Diabetes    Not scheduled {(eye doc passed)    Past Medical History- Patient Active Problem List   Diagnosis Date Noted   Diabetic retinopathy (HCC) 02/12/2017    Priority: High   CAD (coronary artery disease) status post PTCA and stent to obtuse marginal branch in 2016 08/18/2014    Priority: High   DM (diabetes mellitus) (HCC) 02/04/2006    Priority: High   Multiple thyroid  nodules 03/31/2020    Priority: Medium    OSA on CPAP 08/26/2019    Priority: Medium    GERD (gastroesophageal reflux disease) 08/19/2014    Priority: Medium    Hyperlipidemia associated with type 2 diabetes mellitus (HCC) 02/04/2006    Priority: Medium    Essential hypertension 02/04/2006    Priority: Medium    Allergy 12/02/2020    Priority: Low   Old MI (myocardial infarction) 01/29/2017    Priority: Low   Nephrolithiasis     Priority: Low   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     atorvastatin  (LIPITOR) 40 MG tablet Take 1 tablet (40 mg total) by mouth daily. 90 tablet 3   carvedilol  (COREG ) 3.125 MG tablet Take 1 tablet (3.125 mg total) by mouth 2 times daily. 60 tablet 3   empagliflozin  (JARDIANCE ) 25 MG TABS tablet Take 1 tablet (25 mg total) by mouth daily. 90 tablet 3   fluticasone (FLONASE) 50 MCG/ACT nasal spray Place 1 spray into both nostrils daily as needed for allergies or rhinitis.     insulin  glargine, 2 Unit Dial , (TOUJEO  MAX SOLOSTAR) 300 UNIT/ML Solostar Pen Inject 70 Units into the skin daily. 24 mL 3   insulin   lispro (HUMALOG  KWIKPEN) 100 UNIT/ML KwikPen INJECT 20-25 UNITS SUBCUTANEOUSLY THREE TIMES DAILY     lisinopril  (ZESTRIL ) 40 MG tablet Take 1 tablet (40 mg total) by mouth daily. 90 tablet 3   meloxicam  (MOBIC ) 15 MG tablet Take 1 tablet (15 mg total) by mouth daily. 30 tablet 0   metFORMIN  (GLUCOPHAGE ) 1000 MG tablet Take 1 tablet (1,000 mg total) by mouth 2 (two) times daily with a meal. 180 tablet 3   nitroGLYCERIN  (NITROSTAT ) 0.4 MG SL tablet PLACE 1 TABLET UNDER TONGUE EVERY 5 MINUTES AS NEEDED FOR CHEST PAIN. CALL 911/DOCTOR IF YOU TAKE 2 TABLETS. max of 3 tablets/day. 25 tablet 3   Omega-3 Fatty Acids (FISH OIL) 1000 MG CAPS Take 4 capsules by mouth daily.     OZEMPIC , 1 MG/DOSE, 4 MG/3ML SOPN Inject 1 mg into the skin once a week. 9 mL 3   vitamin B-12 (CYANOCOBALAMIN ) 500 MCG tablet Take 500 mcg by mouth daily.     EQ FIBER SUPPLEMENT PO Take 1 tablet by mouth daily. Unknown strength     No current facility-administered medications for this visit.     Objective:  BP 110/70   Pulse 83   Temp (!) 97.4 F (36.3 C)   Ht 5' 10 (1.778 m)   Wt 227 lb 12.8 oz (  103.3 kg)   SpO2 97%   BMI 32.69 kg/m  Gen: NAD, resting comfortably CV: RRR no murmurs rubs or gallops Lungs: CTAB no crackles, wheeze, rhonchi Ext: trace edema Skin: warm, dry Neuro: grossly normal, moves all extremities Musculoskeletal: tender  with forced ulnar flexion over radial portion of wrist, trigger thumb noted    Assessment and Plan    #Right hand De quervain's tenosynovitis - ongoing issues now nearly 6 months- Voltaren gel somewhat helpful- uses at night- but simply doesn't go away. Also has trigger thumb on same arm- refer to sports medicine today for their expertise  %# CAD- follows with Dr. Sheffield [facility] in the past now with Dr. Bernie [facility] #hyperlipidemia S:compliant with aspirin and atorvastatin  40 mg  (2 in a row then day off and repeats) and omega-3 fatty acids Symptoms-no  chest pain or shortness of breath   -1 stent 2015 -down 7 lbs from last visit! Wife doing healthy weight to [facility] and helping him.  Lab Results  Component Value Date   CHOL 121 12/22/2022   HDL 33.10 (L) 12/22/2022   LDLCALC 56 12/22/2022   LDLDIRECT 52.0 10/12/2022   TRIG 158.0 (H) 12/22/2022   CHOLHDL 4 12/22/2022  A/P: coronary artery disease asymptomatic - continue current medications  LDL goal under 70- at goal and even close to 55 on LDL even more ideal- congratulated on 7 lbs down  #hypertension S: compliant with coreg  3.125mg  BID, lisinopril  40mg   -doing beet chew in morning - walking still 4-5 days a week as long as temps allow BP Readings from Last 3 Encounters:  11/02/23 110/70  09/13/23 128/78  09/11/23 118/70  A/P: well controlled continue current medications   %# Diabetes with history of diabetic retinopathy S: followed closely by Dr. Trixie and on both long (toujeo  72 units) and short acting insulin - lispro 24-34 units three times daily- but mainly at dinner, Ozempic  1 mg, metformin  1000 mg twice daily (takes B12 for this), Jardiance  25 mg.    Has history retinopathy- trying to get in with new eye doctor as Dr. Erminia passed away Lab Results  Component Value Date   HGBA1C 7.0 (A) 09/13/2023   HGBA1C 7.7 (H) 04/27/2023   HGBA1C 6.5 (A) 12/22/2022   A/P: diabetes well controlled- doing very well with Dr. Trixie- continue current medications   #Fatty liver- incidental finding when evaluating RUQ pain- losing weight. Has reduced his frying Lab Results  Component Value Date   ALT 18 04/27/2023   AST 15 04/27/2023   ALKPHOS 50 04/27/2023   BILITOT 0.6 04/27/2023   #Snoring and not sleeping well- refer to pulmonary for their insight  Recommended follow up: Return in about 6 months (around 05/04/2024) for physical or sooner if needed.Schedule b4 you leave. Future Appointments  Date Time Provider Department Center  01/16/2024  8:40 AM Trixie File, MD [facility] None  11/04/2024  8:40 AM [facility] ANNUAL WELLNESS VISIT 1 [facility]   Lab/Order associations: medicine and beet chew only   ICD-10-CM   1. Trigger thumb of right hand  M65.311 Ambulatory referral to Sports Medicine    2. De Quervain's tenosynovitis, right  M65.4 Ambulatory referral to Sports Medicine    3. Coronary artery disease involving native coronary artery of native heart without angina pectoris  I25.10     4. Essential hypertension  I10 Comprehensive metabolic panel with GFR    CBC with Differential/Platelet    5. Hyperlipidemia associated with type 2 diabetes  mellitus (HCC)  E11.69 Comprehensive metabolic panel with GFR   E78.5 CBC with Differential/Platelet    Lipid panel    TSH    6. Type 2 diabetes mellitus with other circulatory complication, without long-term current use of insulin  (HCC)  E11.59 Comprehensive metabolic panel with GFR    CBC with Differential/Platelet    7. Snoring  R06.83 Ambulatory referral to Pulmonology    8. Daytime sleepiness  R40.0 Ambulatory referral to Pulmonology      No orders of the defined types were placed in this encounter.   Return precautions advised.  Garnette Lukes, MD

## 2023-11-02 NOTE — Patient Instructions (Addendum)
 Health Maintenance Due  Topic Date Due   OPHTHALMOLOGY EXAM  08/30/2021  Try to get established with new eye doctor  We have placed a referral for you today to sports medicine- please call their # if you do not hear within a week (may be listed below or you may see mychart message within a few days with #).   We have placed a referral for you today to pulmonary for sleep evaluation- please call their # if you do not hear within a week (may be listed below or you may see mychart message within a few days with #).    Great job with weight loss, exercise and healthy eating  Please stop by lab before you go If you have mychart- we will send your results within 3 business days of us  receiving them.  If you do not have mychart- we will call you about results within 5 business days of us  receiving them.  *please also note that you will see labs on mychart as soon as they post. I will later go in and write notes on them- will say notes from Dr. Katrinka   Recommended follow up: Return in about 6 months (around 05/04/2024) for physical or sooner if needed.Schedule b4 you leave.

## 2023-11-19 NOTE — Progress Notes (Unsigned)
 Zachary Warner D.CLEMENTEEN [facility] [address] [place] [address] Phone: [phone]   Assessment and Plan:     1. Right hand pain (Primary) 2. Trigger finger of right thumb -Chronic with exacerbation, initial visit - Consistent with trigger thumb of right first digit slowly progressing and triggering more frequently over the past 8 months - X-ray obtained in clinic.  My interpretation: No acute fracture or dislocation.  Mild degenerative changes at first MCP and first digit sesamoid - Patient has not had significant improvement despite conservative therapy with bracing, meloxicam  course, topical Voltaren gel, HEP - Patient elected for trigger finger CSI.  Tolerated well per note below  Procedure: Flexor Tendon Sheath Injection (Trigger Finger) Side: Right 1st digit  Indication: Trigger Finger  After explaining the procedure, viable alternatives, risks, and answering any questions, consent was given verbally.  The site was cleaned with chlorhexidine prep.  A steroid injection was performed under ultrasound guidance using 0.5ml of 1% lidocaine without epinephrine and 20 mg of Kenalog 40 with sterile technique.  This was well tolerated and resulted in  relief.  Needle was removed and dressing placed and post injection instructions were given including  a discussion of likely return of pain today after the anesthetic wears off (with the possibility of worsened pain) until the steroid starts to work in 3-5 days.   Pt was advised to call or return to clinic if these symptoms worsen or fail to improve as anticipated.  If not at least 50% better in 6 weeks would consider repeat injection. Images permanently stored.   3. De Quervain's tenosynovitis, right -Chronic with exacerbation, initial visit - Consistent with DeQuervain's Tenosynovitis of right wrist likely from repetitive physical activity - X-ray obtained in clinic.  My interpretation: No acute fracture  or dislocation.  Mild degenerative changes at first MCP and first digit sesamoid - Patient has not had significant improvement despite conservative therapy with bracing, meloxicam  course, topical Voltaren gel, HEP - Patient elected for first dorsal wrist compartment CSI.  Tolerated well per note below  Procedure: Ultrasound Guided De Quervain Tendon Sheath Injection Side: Right Diagnosis: DeQuervain's Tenosynovitis US  Indication:  - accuracy is paramount for diagnosis - to ensure therapeutic efficacy or procedural success - to reduce procedural risk  After explaining the procedure, viable alternatives, risks, and answering any questions, consent was given verbally. The site was cleaned with chlorhexidine prep. An ultrasound transducer was placed on the dorsal wrist.  The 1st dorsal wrist compartment was identified.  There was fluid surrounding the tendons and within the sheath. The tendons were Abnormal- thickened.  A steroid injection was performed under ultrasound guidance using  1ml of 1% lidocaine without epinephrine and 40 mg of Kenalog 40. This was well tolerated and resulted in  relief.  Needle was removed and dressing placed and post injection instructions were given including  a discussion of likely return of pain today after the anesthetic wears off (with the possibility of worsened pain) until the steroid starts to work in 1-3 days.   Pt was advised to call or return to clinic if these symptoms worsen or fail to improve as anticipated. Images permanently stored.    Pertinent previous records reviewed include Family medicine note 11/02/2023    Follow Up: As needed if no improvement or worsening of symptoms.  Could consider repeat CSI   Subjective:   I, Zachary Warner, am serving as a Neurosurgeon for Doctor [employer]  Chief Complaint: right  hand pain   HPI:   11/20/2023 Patient is a 68 year old male with right hand pain. Patient states thumb is triggering and going down to his  wrist. Voltaren helps a little. Intermittent locking in thumb since January. He is open to CSI. He is able to make a fist and grip. No decrease in grip strength.   Relevant Historical Information: HTN, DM II  Additional pertinent review of systems negative.   Current Outpatient Medications:    aspirin 81 MG tablet, Take 81 mg by mouth daily., Disp: , Rfl:    atorvastatin  (LIPITOR) 40 MG tablet, Take 1 tablet (40 mg total) by mouth daily., Disp: 90 tablet, Rfl: 3   carvedilol  (COREG ) 3.125 MG tablet, Take 1 tablet (3.125 mg total) by mouth 2 times daily., Disp: 60 tablet, Rfl: 3   empagliflozin  (JARDIANCE ) 25 MG TABS tablet, Take 1 tablet (25 mg total) by mouth daily., Disp: 90 tablet, Rfl: 3   EQ FIBER SUPPLEMENT PO, Take 1 tablet by mouth daily. Unknown strength, Disp: , Rfl:    fluticasone (FLONASE) 50 MCG/ACT nasal spray, Place 1 spray into both nostrils daily as needed for allergies or rhinitis., Disp: , Rfl:    insulin  glargine, 2 Unit Dial , (TOUJEO  MAX SOLOSTAR) 300 UNIT/ML Solostar Pen, Inject 70 Units into the skin daily., Disp: 24 mL, Rfl: 3   insulin  lispro (HUMALOG  KWIKPEN) 100 UNIT/ML KwikPen, INJECT 20-25 UNITS SUBCUTANEOUSLY THREE TIMES DAILY, Disp: , Rfl:    lisinopril  (ZESTRIL ) 40 MG tablet, Take 1 tablet (40 mg total) by mouth daily., Disp: 90 tablet, Rfl: 3   meloxicam  (MOBIC ) 15 MG tablet, Take 1 tablet (15 mg total) by mouth daily., Disp: 30 tablet, Rfl: 0   metFORMIN  (GLUCOPHAGE ) 1000 MG tablet, Take 1 tablet (1,000 mg total) by mouth 2 (two) times daily with a meal., Disp: 180 tablet, Rfl: 3   nitroGLYCERIN  (NITROSTAT ) 0.4 MG SL tablet, PLACE 1 TABLET UNDER TONGUE EVERY 5 MINUTES AS NEEDED FOR CHEST PAIN. CALL 911/DOCTOR IF YOU TAKE 2 TABLETS. max of 3 tablets/day., Disp: 25 tablet, Rfl: 3   Omega-3 Fatty Acids (FISH OIL) 1000 MG CAPS, Take 4 capsules by mouth daily., Disp: , Rfl:    OZEMPIC , 1 MG/DOSE, 4 MG/3ML SOPN, Inject 1 mg into the skin once a week., Disp: 9 mL, Rfl:  3   vitamin B-12 (CYANOCOBALAMIN ) 500 MCG tablet, Take 500 mcg by mouth daily., Disp: , Rfl:    Objective:     Vitals:   11/20/23 1245  Pulse: 87  SpO2: 96%  Weight: 227 lb (103 kg)  Height: 5' 10 (1.778 m)      Body mass index is 32.57 kg/m.    Physical Exam:    General: Appears well, nad, nontoxic and pleasant Neuro:sensation intact, strength is 5/5 in upper extremities, muscle tone wnl Skin:no susupicious lesions or rashes  Right hand/Wrist:   No deformity or swelling appreciated. Triggering of right first digit with palpable, mobile nodule over palmar sided first MCP ROM  Ext 90, flexion 70, radial/ulnar deviation 30 nttp over the snuff box, dorsal carpals, volar carpals, radial styloid, ulnar styloid, 1st mcp, tfcc Negative Tinel's, Phalen's, Prayer Tests Positive finklestein   Electronically signed by:  Odis Mace D.CLEMENTEEN [facility] 1:12 PM 11/20/23

## 2023-11-20 ENCOUNTER — Other Ambulatory Visit: Payer: Self-pay

## 2023-11-20 ENCOUNTER — Ambulatory Visit: Admitting: Sports Medicine

## 2023-11-20 ENCOUNTER — Ambulatory Visit

## 2023-11-20 VITALS — HR 87 | Ht 70.0 in | Wt 227.0 lb

## 2023-11-20 DIAGNOSIS — M79641 Pain in right hand: Secondary | ICD-10-CM | POA: Diagnosis not present

## 2023-11-20 DIAGNOSIS — M654 Radial styloid tenosynovitis [de Quervain]: Secondary | ICD-10-CM | POA: Diagnosis not present

## 2023-11-20 DIAGNOSIS — M65311 Trigger thumb, right thumb: Secondary | ICD-10-CM

## 2023-11-20 DIAGNOSIS — S63111A Subluxation of metacarpophalangeal joint of right thumb, initial encounter: Secondary | ICD-10-CM | POA: Diagnosis not present

## 2023-11-20 DIAGNOSIS — M25531 Pain in right wrist: Secondary | ICD-10-CM | POA: Diagnosis not present

## 2023-11-20 NOTE — Patient Instructions (Signed)
 Recommend Tylenol  [phone] mg 2-3 times a day for pain relief   Continue exercises   As needed follow up

## 2023-12-24 NOTE — Progress Notes (Unsigned)
 12/25/23- 68 yoM never smoker for sleep evaluation courtesy of Dr Garnette Lukes with  concern of  OSA on CPAP CPAP titrstion study done 06/22/05, NPSG was prior but not in EMR Medical problem list includes CAD/MI/Stent, DM, HTN,  Hyperlipidemia, GERD,  Epworth score-13 Body weight today-229 lbs Discussed the use of AI scribe software for clinical note transcription with the patient, who gave verbal consent to proceed.  History of Present Illness   Zachary Warner is a 68 year old male who presents with difficulty sleeping. He was referred by Dr. Lukes for evaluation of sleep disturbances.  He experiences difficulty initiating and maintaining sleep, with early morning awakenings around 4:00 to 4:30 AM. Daytime fatigue is present, and he often falls asleep quickly when sitting in his recliner after work. He has reduced caffeine intake, limiting it to before 2:00 PM.  He uses a CPAP machine nightly, which is 65 to 33 years old. His wife notes occasional twitching or jerking during sleep, and the nasal pillows of the CPAP sometimes come loose. He wakes himself up snoring when sleeping in his recliner. He has not had a sleep study in about 12 years and purchases CPAP supplies online.     Prior to Admission medications   Medication Sig Start Date End Date Taking? Authorizing Provider  aspirin 81 MG tablet Take 81 mg by mouth daily.   Yes [provider]  atorvastatin  (LIPITOR) 40 MG tablet Take 1 tablet (40 mg total) by mouth daily. 10/08/23  Yes Lukes Garnette KIDD, MD  carvedilol  (COREG ) 3.125 MG tablet Take 1 tablet (3.125 mg total) by mouth 2 times daily. 10/08/23  Yes Lukes Garnette KIDD, MD  empagliflozin  (JARDIANCE ) 25 MG TABS tablet Take 1 tablet (25 mg total) by mouth daily. 09/13/23  Yes Trixie File, MD  EQ FIBER SUPPLEMENT PO Take 1 tablet by mouth daily. Unknown strength   Yes [provider]  fluticasone (FLONASE) 50 MCG/ACT nasal spray Place 1 spray into both nostrils  daily as needed for allergies or rhinitis.   Yes [provider]  insulin  glargine, 2 Unit Dial , (TOUJEO  MAX SOLOSTAR) 300 UNIT/ML Solostar Pen Inject 70 Units into the skin daily. 09/13/23  Yes Trixie File, MD  insulin  lispro (HUMALOG  KWIKPEN) 100 UNIT/ML KwikPen INJECT 20-25 UNITS SUBCUTANEOUSLY THREE TIMES DAILY 05/03/23  Yes Trixie File, MD  lisinopril  (ZESTRIL ) 40 MG tablet Take 1 tablet (40 mg total) by mouth daily. 09/19/23  Yes Lukes Garnette KIDD, MD  meloxicam  (MOBIC ) 15 MG tablet Take 1 tablet (15 mg total) by mouth daily. 09/11/23  Yes Kennyth Worth HERO, MD  metFORMIN  (GLUCOPHAGE ) 1000 MG tablet Take 1 tablet (1,000 mg total) by mouth 2 (two) times daily with a meal. 09/13/23  Yes Trixie File, MD  Omega-3 Fatty Acids (FISH OIL) 1000 MG CAPS Take 4 capsules by mouth daily.   Yes [provider]  OZEMPIC , 1 MG/DOSE, 4 MG/3ML SOPN Inject 1 mg into the skin once a week. 03/06/23  Yes Trixie File, MD  traZODone (DESYREL) 50 MG tablet 1 or 2 tabs at bedtime as needed for sleep 12/25/23  Yes Deaven Barron, Reggy D, MD  vitamin B-12 (CYANOCOBALAMIN ) 500 MCG tablet Take 500 mcg by mouth daily.   Yes [provider]  nitroGLYCERIN  (NITROSTAT ) 0.4 MG SL tablet Place 1 tablet (0.4 mg total) under the tongue every 5 (five) minutes as needed for chest pain. 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 Austin- thought hyperglycemia     Allergy    CAD (coronary artery disease) status post PTCA and stent to obtuse marginal branch in 2016 08/18/2014   aspirin, statin, coreg .   CAD- MI 08/17/2014 and stent day later.   effient a year now off  Dr. Krasowkski in [place] [facility] on epic)     Formatting of this note might be different from the original.  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 optho 02/08/17     DM (diabetes mellitus) (HCC) 02/04/2006   Followed by Endocrine Dr. Trixie. #s improving under their care.   Insulin  long and short acting sliding scale, invokana  300mg , metformin  1000mg  BID  Must use fructosamine           GERD (gastroesophageal reflux disease) 08/19/2014   Hyperlipidemia    Hypertension    Multiple thyroid  nodules 03/31/2020   Myocardial infarction [facility])    May 2016   Nephrolithiasis    3x 2007-2015, passed on his own   RECTAL FISSURE 06/05/2006   Dilt gel when occurred in past        Sleep apnea    wears CPAP   Past Surgical History:  Procedure Laterality Date   CARDIAC CATHETERIZATION     stent May 2016   COLONOSCOPY     heart stent     May 2016   HERNIA REPAIR     Family History  Problem Relation Age of Onset   Coronary artery disease Father    Liver disease Father        from heart meds per pt   Diabetes Sister        grandmother   Kidney disease Sister        dialysis from dm   Diabetes Sister    Other Sister        flash pulmonary edema led to death   Colon cancer Neg Hx    Esophageal cancer Neg Hx    Rectal cancer Neg Hx    Stomach cancer Neg Hx    Social History   Socioeconomic History   Marital status: Married    Spouse name: Not on file   Number of children: Not on file   Years of education: Not on file   Highest education level: 12th grade  Occupational History   Not on file  Tobacco Use   Smoking status: Never   Smokeless tobacco: Never  Substance and Sexual Activity   Alcohol use: No   Drug use: No   Sexual activity: Never  Other Topics Concern   Not on file  Social History Narrative   Married. 2 children son and daughter. Granddaughter 12/02/2014 born and grandson 7/17//2018.       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      Hobbies: golfing (low to  mid 66s)   Social Drivers of Corporate investment banker Strain: Low Risk  (11/01/2023)   Overall Financial Resource Strain (CARDIA)    Difficulty of Paying Living Expenses: Not hard at all  Food Insecurity: No Food Insecurity (11/01/2023)   Hunger Vital Sign    Worried About Running Out of Food in the Last Year: Never  true    Ran Out of Food in the Last Year: Never true  Transportation Needs: No Transportation Needs (11/01/2023)   PRAPARE - Administrator, Civil Service (Medical): No    Lack of Transportation (Non-Medical): No  Physical Activity: Sufficiently Active (11/01/2023)   Exercise Vital Sign    Days of Exercise per Week: 5 days    Minutes of Exercise per Session: 30 min  Recent Concern: Physical Activity - Insufficiently Active (09/10/2023)   Exercise Vital Sign    Days of Exercise per Week: 2 days    Minutes of Exercise per Session: 30 min  Stress: No Stress Concern Present (11/01/2023)   [employer] of Occupational Health - Occupational Stress Questionnaire    Feeling of Stress: Not at all  Social Connections: Moderately Integrated (11/01/2023)   Social Connection and Isolation Panel    Frequency of Communication with Friends and Family: More than three times a week    Frequency of Social Gatherings with Friends and Family: More than three times a week    Attends Religious Services: More than 4 times per year    Active Member of [employer] or Organizations: No    Attends Banker Meetings: Never    Marital Status: Married  Catering manager Violence: Not At Risk (11/01/2023)   Humiliation, Afraid, Rape, and Kick questionnaire    Fear of Current or Ex-Partner: No    Emotionally Abused: No    Physically Abused: No    Sexually Abused: No   Assessment and Plan:    Obstructive sleep apnea on CPAP therapy Long-standing obstructive sleep apnea managed with outdated CPAP lacking humidity control and digital output. Inspire therapy unsuitable as first-line due  to invasiveness and cost. - Order home sleep study to reassess sleep apnea and qualify for CPAP replacement. - Continue current CPAP use until home sleep study is conducted. - Discuss potential CPAP replacement based on home sleep study results.  Insomnia and daytime sleepiness Chronic insomnia with ineffective response to melatonin and Z-Quil. Benadryl not recommended due to cognitive effects. - Prescribe trazodone for sleep aid.  Periodic limb movement disorder with nocturnal leg pain Nocturnal leg pain and twitching noted, contributing to sleep disturbances. Symptoms not severe or frequent.     ROS-see HPI  + = positive Constitutional:    weight loss, night sweats, fevers, chills, fatigue, lassitude. HEENT:    headaches, difficulty swallowing, tooth/dental problems, sore throat,       sneezing, itching, ear ache, nasal congestion, post nasal drip, snoring CV:    chest pain, orthopnea, PND, swelling in lower extremities, anasarca,                                   dizziness, palpitations Resp:   shortness of breath with exertion or at rest.                productive cough,   non-productive cough, coughing up of blood.              change in color of mucus.  wheezing.   Skin:    rash or lesions. GI:  No-   heartburn, indigestion, abdominal pain, nausea, vomiting, diarrhea,                 change in bowel habits, loss of appetite GU: dysuria, change in color of urine, no urgency or frequency.   flank pain. MS:  joint pain, stiffness, decreased range of motion, back pain. Neuro-     nothing unusual Psych:  change in mood or affect.  depression or anxiety.   memory loss.  OBJ- Physical Exam General- Alert, Oriented, Affect-appropriate, Distress- none acute, +overweight Skin- rash-none, lesions- none, excoriation- none Lymphadenopathy- none Head- atraumatic            Eyes- Gross vision intact, PERRLA, conjunctivae and secretions clear            Ears- Hearing, canals-normal             Nose- Clear, no-Septal dev, mucus, polyps, erosion, perforation             Throat- Mallampati IV , mucosa clear , drainage- none, tonsils- atrophic, +teeth Neck- flexible , trachea midline, no stridor , thyroid  nl, carotid no bruit Chest - symmetrical excursion , unlabored           Heart/CV- RRR , no murmur , no gallop  , no rub, nl s1 s2                           - JVD- none , edema- none, stasis changes- none, varices- none           Lung- clear to P&A, wheeze- none, cough- none , dullness-none, rub- none           Chest wall-  Abd-  Br/ Gen/ Rectal- Not done, not indicated Extrem- cyanosis- none, clubbing, none, atrophy- none, strength- nl Neuro- grossly intact to observation

## 2023-12-25 ENCOUNTER — Encounter: Payer: Self-pay | Admitting: Internal Medicine

## 2023-12-25 ENCOUNTER — Ambulatory Visit (INDEPENDENT_AMBULATORY_CARE_PROVIDER_SITE_OTHER): Admitting: Internal Medicine

## 2023-12-25 VITALS — BP 126/60 | HR 88 | Temp 98.3°F | Ht 70.0 in | Wt 229.8 lb

## 2023-12-25 DIAGNOSIS — G4733 Obstructive sleep apnea (adult) (pediatric): Secondary | ICD-10-CM | POA: Diagnosis not present

## 2023-12-25 MED ORDER — TRAZODONE HCL 50 MG PO TABS: ORAL_TABLET | ORAL | 2 refills | Status: DC

## 2023-12-25 NOTE — Patient Instructions (Signed)
 Order- schedule home sleep test  off CPAP- dx OSA  Please call for results and recommendations about 2 weeks after your sleep test  Script sent to try trazodone at bedtime for sleep

## 2023-12-27 ENCOUNTER — Ambulatory Visit: Attending: Cardiology | Admitting: Cardiology

## 2023-12-27 ENCOUNTER — Encounter: Payer: Self-pay | Admitting: Cardiology

## 2023-12-27 VITALS — BP 122/56 | HR 80 | Ht 70.0 in | Wt 231.4 lb

## 2023-12-27 DIAGNOSIS — E782 Mixed hyperlipidemia: Secondary | ICD-10-CM | POA: Diagnosis not present

## 2023-12-27 DIAGNOSIS — E1159 Type 2 diabetes mellitus with other circulatory complications: Secondary | ICD-10-CM

## 2023-12-27 DIAGNOSIS — I10 Essential (primary) hypertension: Secondary | ICD-10-CM

## 2023-12-27 DIAGNOSIS — I2510 Atherosclerotic heart disease of native coronary artery without angina pectoris: Secondary | ICD-10-CM | POA: Diagnosis not present

## 2023-12-27 MED ORDER — NITROGLYCERIN 0.4 MG SL SUBL: 0.4000 mg | SUBLINGUAL_TABLET | SUBLINGUAL | 5 refills | Status: AC | PRN

## 2023-12-27 NOTE — Addendum Note (Signed)
 Addended by: ARLOA PLANAS D on: 12/27/2023 08:29 AM   Modules accepted: Orders

## 2023-12-27 NOTE — Progress Notes (Signed)
 Cardiology Office Note:    Date:  12/27/2023   ID:  Zachary Warner, Zachary Warner Dec 17, 1955, MRN [id number]  PCP:  Katrinka Garnette KIDD, MD  Cardiologist:  Lamar Fitch, MD    Referring MD: Katrinka Garnette KIDD, MD   No chief complaint on file. Doing fine  History of Present Illness:    Zachary Warner is a 68 y.o. male past medical history significant for coronary artery disease.  In 2016 he did suffer from myocardial infarction required PTCA and stenting of the obtuse marginal branch at the time he was also found to have mild to moderate disease of distal LAD, ejection fraction preserved, additional problem include essential hypertension, longstanding diabetes, dyslipidemia, obstructive sleep apnea.  Comes today to months for follow-up overall doing great.  Walks on the regular basis denies of any chest pain tightness squeezing pressure burning chest.  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 Austin- thought hyperglycemia     Allergy    CAD (coronary artery disease) status post PTCA and stent to obtuse marginal branch in 2016 08/18/2014   aspirin, statin, coreg .   CAD- MI 08/17/2014 and stent day later.   effient a year now off  Dr. Krasowkski in [place] [facility] on epic)     Formatting of this note might be different from the original.  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 optho 02/08/17     DM (diabetes mellitus) (HCC) 02/04/2006   Followed by Endocrine Dr. Trixie. #s improving under their care.   Insulin  long and short acting sliding scale, invokana  300mg , metformin  1000mg  BID  Must use fructosamine           GERD (gastroesophageal reflux disease) 08/19/2014   Hyperlipidemia    Hypertension    Multiple thyroid  nodules 03/31/2020    Myocardial infarction [facility])    May 2016   Nephrolithiasis    3x 2007-2015, passed on his own   RECTAL FISSURE 06/05/2006   Dilt gel when occurred in past        Sleep apnea    wears CPAP    Past Surgical History:  Procedure Laterality Date   CARDIAC CATHETERIZATION     stent May 2016   COLONOSCOPY     heart stent     May 2016   HERNIA REPAIR      Current Medications: Current Meds  Medication Sig   aspirin 81 MG tablet Take 81 mg by mouth daily.   atorvastatin  (LIPITOR) 40 MG tablet Take 1 tablet (40 mg total) by mouth daily.   carvedilol  (COREG ) 3.125 MG tablet Take 1 tablet (3.125 mg total) by mouth 2 times daily.   empagliflozin  (JARDIANCE ) 25 MG TABS tablet Take 1 tablet (25 mg total) by mouth daily.   EQ FIBER SUPPLEMENT PO Take 1 tablet by mouth daily. Unknown strength   fluticasone (FLONASE) 50 MCG/ACT nasal spray Place 1 spray into both nostrils daily as needed for allergies or rhinitis.   insulin  glargine, 2 Unit Dial , (TOUJEO  MAX SOLOSTAR) 300 UNIT/ML Solostar Pen Inject 70 Units into the skin daily.   insulin  lispro (HUMALOG  KWIKPEN) 100 UNIT/ML KwikPen INJECT 20-25 UNITS SUBCUTANEOUSLY THREE TIMES DAILY   lisinopril  (ZESTRIL ) 40 MG tablet Take 1 tablet (40 mg  total) by mouth daily.   meloxicam  (MOBIC ) 15 MG tablet Take 1 tablet (15 mg total) by mouth daily.   metFORMIN  (GLUCOPHAGE ) 1000 MG tablet Take 1 tablet (1,000 mg total) by mouth 2 (two) times daily with a meal.   nitroGLYCERIN  (NITROSTAT ) 0.4 MG SL tablet PLACE 1 TABLET UNDER TONGUE EVERY 5 MINUTES AS NEEDED FOR CHEST PAIN. CALL 911/DOCTOR IF YOU TAKE 2 TABLETS. max of 3 tablets/day.   Omega-3 Fatty Acids (FISH OIL) 1000 MG CAPS Take 4 capsules by mouth daily.   OZEMPIC , 1 MG/DOSE, 4 MG/3ML SOPN Inject 1 mg into the skin once a week.   traZODone (DESYREL) 50 MG tablet 1 or 2 tabs at bedtime as needed for sleep   vitamin B-12 (CYANOCOBALAMIN ) 500 MCG tablet Take 500 mcg by mouth daily.     Allergies:    Invokana  [canagliflozin ]   Social History   Socioeconomic History   Marital status: Married    Spouse name: Not on file   Number of children: Not on file   Years of education: Not on file   Highest education level: 12th grade  Occupational History   Not on file  Tobacco Use   Smoking status: Never   Smokeless tobacco: Never  Substance and Sexual Activity   Alcohol use: No   Drug use: No   Sexual activity: Never  Other Topics Concern   Not on file  Social History Narrative   Married. 2 children son and daughter. Granddaughter 12/02/2014 born and grandson 7/17//2018.       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      Hobbies: golfing (low to mid 39s)   Social Drivers of Corporate investment banker Strain: Low Risk  (11/01/2023)   Overall Financial Resource Strain (CARDIA)    Difficulty of Paying Living Expenses: Not hard at all  Food Insecurity: No Food Insecurity (11/01/2023)   Hunger Vital Sign    Worried About Running Out of Food in the Last Year: Never true    Ran Out of Food in the Last Year: Never true  Transportation Needs: No Transportation Needs (11/01/2023)   PRAPARE - Administrator, Civil Service (Medical): No    Lack of Transportation (Non-Medical): No  Physical Activity: Sufficiently Active (11/01/2023)   Exercise Vital Sign    Days of Exercise per Week: 5 days    Minutes of Exercise per Session: 30 min  Recent Concern: Physical Activity - Insufficiently Active (09/10/2023)   Exercise Vital Sign    Days of Exercise per Week: 2 days    Minutes of Exercise per Session: 30 min  Stress: No Stress Concern Present (11/01/2023)   [employer] of Occupational Health - Occupational Stress Questionnaire    Feeling of Stress: Not at all  Social Connections: Moderately Integrated (11/01/2023)   Social Connection and Isolation Panel    Frequency of Communication with Friends and Family: More than three times a week     Frequency of Social Gatherings with Friends and Family: More than three times a week    Attends Religious Services: More than 4 times per year    Active Member of [employer] or Organizations: No    Attends Engineer, structural: Never    Marital Status: Married     Family History: The patient's family history includes Coronary artery disease in his father; Diabetes in his sister and sister; Kidney disease in his sister; Liver disease  in his father; Other in his sister. There is no history of Colon cancer, Esophageal cancer, Rectal cancer, or Stomach cancer. ROS:   Please see the history of present illness.    All 14 point review of systems negative except as described per history of present illness  EKGs/Labs/Other Studies Reviewed:    EKG Interpretation Date/Time:  Thursday December 27 2023 08:07:28 EDT Ventricular Rate:  80 PR Interval:  200 QRS Duration:  86 QT Interval:  342 QTC Calculation: 394 R Axis:   54  Text Interpretation: Normal sinus rhythm Normal ECG When compared with ECG of 26-Nov-2007 03:21, Questionable change in QRS axis Confirmed by Bernie Charleston [phone]) on 12/27/2023 8:12:58 AM    Recent Labs: 11/02/2023: ALT 17; BUN 16; Creatinine, Ser 1.01; Hemoglobin 14.6; Platelets 176.0; Potassium 4.5; Sodium 140; TSH 0.85  Recent Lipid Panel    Component Value Date/Time   CHOL 128 11/02/2023 1003   CHOL 114 12/03/2020 0922   TRIG 183.0 (H) 11/02/2023 1003   TRIG 176 (H) 01/29/2006 1110   HDL 36.20 (L) 11/02/2023 1003   HDL 29 (L) 12/03/2020 0922   CHOLHDL 4 11/02/2023 1003   VLDL 36.6 11/02/2023 1003   LDLCALC 55 11/02/2023 1003   LDLCALC 59 12/03/2020 0922   LDLCALC 51 03/03/2020 0952   LDLDIRECT 52.0 10/12/2022 0922    Physical Exam:    VS:  BP (!) 122/56   Pulse 80   Ht 5' 10 (1.778 m)   Wt 231 lb 6.4 oz (105 kg)   SpO2 97%   BMI 33.20 kg/m     Wt Readings from Last 3 Encounters:  12/27/23 231 lb 6.4 oz (105 kg)  12/25/23 229 lb 12.8 oz  (104.2 kg)  11/20/23 227 lb (103 kg)     GEN:  Well nourished, well developed in no acute distress HEENT: Normal NECK: No JVD; No carotid bruits LYMPHATICS: No lymphadenopathy CARDIAC: RRR, no murmurs, no rubs, no gallops RESPIRATORY:  Clear to auscultation without rales, wheezing or rhonchi  ABDOMEN: Soft, non-tender, non-distended MUSCULOSKELETAL:  No edema; No deformity  SKIN: Warm and dry LOWER EXTREMITIES: no swelling NEUROLOGIC:  Alert and oriented x 3 PSYCHIATRIC:  Normal affect   ASSESSMENT:    1. Coronary artery disease involving native coronary artery of native heart without angina pectoris   2. Primary hypertension   3. Mixed hyperlipidemia   4. Type 2 diabetes mellitus with other circulatory complication, without long-term current use of insulin  (HCC)    PLAN:    In order of problems listed above:  Coronary disease stable from that point review on antiplatelet therapy which I will continue, asymptomatic. Essential hypertension blood pressure excellently controlled continue present management. Dyslipidemia he is on high intensity statin for Lipitor 40 which I will continue I did review [facility] which show me data from 11/02/2023 with LDL 55 HDL 36.  Will continue present management. Type 2 diabetes, followed by primary care physician last hemoglobin A1c from June of this year was 7.0 good control continue present management   Medication Adjustments/Labs and Tests Ordered: Current medicines are reviewed at length with the patient today.  Concerns regarding medicines are outlined above.  Orders Placed This Encounter  Procedures   EKG 12-Lead   Medication changes: No orders of the defined types were placed in this encounter.   Signed, [place] DOROTHA Bernie, MD, [facility] 12/27/2023 8:21 AM    [facility]

## 2023-12-27 NOTE — Patient Instructions (Signed)

## 2023-12-28 ENCOUNTER — Encounter: Payer: Self-pay | Admitting: Internal Medicine

## 2024-01-05 ENCOUNTER — Other Ambulatory Visit: Payer: Self-pay | Admitting: Family Medicine

## 2024-01-08 ENCOUNTER — Encounter

## 2024-01-08 DIAGNOSIS — G4730 Sleep apnea, unspecified: Secondary | ICD-10-CM | POA: Diagnosis not present

## 2024-01-08 DIAGNOSIS — G4733 Obstructive sleep apnea (adult) (pediatric): Secondary | ICD-10-CM

## 2024-01-16 ENCOUNTER — Ambulatory Visit: Admitting: Internal Medicine

## 2024-01-22 DIAGNOSIS — G4733 Obstructive sleep apnea (adult) (pediatric): Secondary | ICD-10-CM | POA: Diagnosis not present

## 2024-01-23 ENCOUNTER — Encounter: Payer: Self-pay | Admitting: Internal Medicine

## 2024-01-23 ENCOUNTER — Ambulatory Visit: Admitting: Internal Medicine

## 2024-01-23 VITALS — BP 124/80 | HR 83 | Ht 70.0 in | Wt 226.0 lb

## 2024-01-23 DIAGNOSIS — E042 Nontoxic multinodular goiter: Secondary | ICD-10-CM

## 2024-01-23 DIAGNOSIS — E1159 Type 2 diabetes mellitus with other circulatory complications: Secondary | ICD-10-CM

## 2024-01-23 DIAGNOSIS — Z794 Long term (current) use of insulin: Secondary | ICD-10-CM | POA: Diagnosis not present

## 2024-01-23 DIAGNOSIS — E785 Hyperlipidemia, unspecified: Secondary | ICD-10-CM | POA: Diagnosis not present

## 2024-01-23 LAB — POCT GLYCOSYLATED HEMOGLOBIN (HGB A1C): Hemoglobin A1C: 6.8 % — AB (ref 4.0–5.6)

## 2024-01-23 MED ORDER — OZEMPIC (1 MG/DOSE) 4 MG/3ML ~~LOC~~ SOPN: 1.0000 mg | PEN_INJECTOR | SUBCUTANEOUS | 3 refills | Status: AC

## 2024-01-23 NOTE — Progress Notes (Signed)
 SABRAxtrosPatient ID: Gretel LELON Domino, male   DOB: [birth year], 68 y.o.   MRN: [id number]  HPI: Zachary Warner is a 68 y.o.-year-old male, presenting for f/u for DM2, dx in ~1995, insulin -dependent since ~2005, uncontrolled, with complications (CAD - s/p AMI, DR). Last visit 4 months ago.  Interim history: No increased urination, blurry vision, nausea, chest pain.  He does have congestion which he feels is related to allergies. His wife is seeing Dr. Berkeley in the [facility] management clinic.  He mentions that he and his wife improved their diet - stopped fried food, walking more.  DM2:  Reviewed HbA1c levels: Lab Results  Component Value Date   HGBA1C 7.0 (A) 09/13/2023   HGBA1C 7.7 (H) 04/27/2023   HGBA1C 6.5 (A) 12/22/2022  07/22/2020: HbA1c calculated from fructosamine is 6.16%, excellent. 03/31/2020: HbA1c calculated from fructosamine is at goal, at 6.1%. 11/25/2019: HbA1c calculated from fructosamine is 6.2% 01/10/2019: HbA1c calculated from fructosamine 6.35% 04/09/2017: HbA1c calculated from fructosamine is 6.8% 11/28/2017:  Hba1c calculated from fructosamine is better: 6.45% 07/11/2017: HbA1c calculated from the fructosamine is 6.6%. 03/08/2017: HbA1c calculated from the fructosamine is 6.5%. 11/06/2016: HbA1c calculated from the fructosamine is slightly higher, at 6.6% 07/05/2016: HbA1c calculated from the fructosamine is 6.2% 04/05/2016: HbA1c calculated from fructosamine is excellent, at 6% 01/04/2016: HbA1c calculated from fructosamine is  6.55%. 11/10/2015: HbA1c calculated from fructosamine is 5.8%. 08/10/2015: HbA1c calculated from fructosamine is 7.05%. 04/09/2015: HbA1c calculated fructosamine (326): 7.1%, which is much more concordant with his sugar log.   He is on: - Metformin  1000 mg 2x a day. - Jardiance  25 mg daily before breakfast - Ozempic  0.5 mg weekly-added 07/2019 >> 1 mg weekly - Tresiba  U200  >> Toujeo  80 >> 70 units daily - Humalog : 25-30-40 units before meals >>  25-30-35 (36-38) units before meals >> - 25 units before b'fast - 25-28 >> 20-25 units before lunch  - 30-32 >> 20-25 >> actually using 30-36 units before dinner!! >>  20 to 25 units We stopped Januvia  2/2 large doses of mealtime insulin . He initially had yeast infections with Jardiance , but now controlled with Azo.  He checks his sugars 1x a day: - am: 69-121, 198 >> 68-120, 132, 140 >> 83-135, 142 - 2h after b'fast: 103-179 >> 165 >> 99-119 >> 156 >> n/c - lunch:  n/c >> 67 >> n/c >> 132, 148 >> 142 >> n/c - 2h after lunch: 106, 120, 222, 266 >> n/c>> 122 >> n/c - dinner: 120-139 >> 72, 130, 147 >> 89 >> n/c - 2h after dinner: 148-189, 300 >> 200 >> 160, 163 >> n/c - bedtime: see below >> 72-164 >> 67, 72 >> n/c - nighttime: 56, 63, 70, 74, 83 >> 176 >> n/c >> 83, 149 Low sugar: 50 - at night ... >> 69 >> 59 >> 70; has hypoglycemia awareness in the 60s. Highest sugar was 368 ...>> 190 >> 156 >> 149.  He has a ReliOn meter.  Pt's meals are: - Breakfast: grits or sandwich - Lunch: sandwich - Dinner: chicken or beef or pork + vegetables - Snacks: 1 or 2 snacks  - crackers, peanuts or cookies  No CKD, last BUN/creatinine:  Lab Results  Component Value Date   BUN 16 11/02/2023   CREATININE 1.01 11/02/2023   Lab Results  Component Value Date   MICRALBCREAT 7 09/13/2023   MICRALBCREAT 0.6 03/07/2013   MICRALBCREAT 13.7 05/29/2006  On lisinopril .  + HL; last set of lipids: Lab Results  Component Value Date   CHOL 128 11/02/2023   HDL 36.20 (L) 11/02/2023   LDLCALC 55 11/02/2023   LDLDIRECT 52.0 10/12/2022   TRIG 183.0 (H) 11/02/2023   CHOLHDL 4 11/02/2023  On Lipitor 40 mg daily and fish oil  1000 mg 1x a day.  - Latest eye exam: 06/2022: No DR reportedly, prev.+ DR. He saw Dr. Elspeth Coventry - he passed away.  - no numbness and tingling in his feet. Last foot exam: 12/22/2022.  He also has a history of HTN, h/o nephrolithiasis x 3, last in 03/2013, OSA.   He had an  MI 07/2014 >> had a stent placed. Cardiologist: Dr Krosowski. He is on supplementation with B12. He had heel pain, improved after starting to use special socks and using a TENS unit-Plantar fasciitis.  He is retired and works only 3 days a week (10 am to 4 pm).  He loves it.  Thyroid  nodules:  Thyroid  U/S (01/22/2019): Several nodules, of which 2 were dominant: Parenchymal Echotexture: Moderately heterogenous Isthmus: 1.0 cm Right lobe: 5.4 x 2.0 x 1.8 cm Left lobe: 4.7 x 2.4 x 2.0 cm _____________________________________   Estimated total number of nodules >/= 1 cm: 5 _____________________________________   Nodule # 1: Location: Isthmus; Mid Maximum size: 1.9 cm; Other 2 dimensions: 1.3 x 1.7 cm Composition: cannot determine (2) Echogenicity: hypoechoic (2) Echogenic foci: macrocalcifications (1) ACR TI-RADS total points: 5.  **Given size (>/= 1.5 cm) and appearance, fine needle aspiration of this moderately suspicious nodule should be considered based on TI-RADS criteria. _________________________________________________________   Nodule # 2: Location: Right; Superior Maximum size: 1.0 cm; Other 2 dimensions: 0.6 x 0.7 cm Composition: solid/almost completely solid (2) Echogenicity: hypoechoic (2) Echogenic foci: punctate echogenic foci (3) ACR TI-RADS total points: 7.  **Given size (>/= 1.0 cm) and appearance, fine needle aspiration of this highly suspicious nodule should be considered based on TI-RADS criteria. _________________________________________________________   Nodule #3 is a predominantly cystic nodule the mid right thyroid  lobe that measures 1.1 x 0.8 x 0.8 cm.   Nodule #4 is a cystic nodule in the superior left thyroid  lobe that measures 1.5 x 0.9 x 1.0 cm.   Nodule #5 is a mildly complex cystic nodule with septations in the mid left thyroid  lobe that measures 1.2 x 1.0 x 1.3 cm.   Nodule # 6: Location: Left; Mid Maximum size: 0.8 cm; Other 2  dimensions: 0.6 x 0.5 cm Composition: mixed cystic and solid (1) Echogenicity: isoechoic (1) Shape: taller-than-wide (3) ACR TI-RADS total points: 5. Given size (<0.9 cm) and appearance, this nodule does NOT meet TI-RADS criteria for biopsy or dedicated follow-up. _______________________________________   IMPRESSION: 1. Multinodular goiter. 2. Nodule #1 in the isthmus and nodule #2 in the superior right thyroid  lobe both meet criteria for ultrasound-guided biopsy. 3. Multiple cystic nodules that do not meet criteria for biopsy.   Biopsies of the dominant nodules (02/13/2019): Benign:  Clinical History: Nodule 1 Isthmus Mid 1.9 cm; Other 2 dimensions: 1.3 x  1.7 cm, Hypoechoic, ACR TI-RADS total points: 5, Moderately suspicious  nodule  Specimen Submitted:  A. THYROID  ISTHMUS, FINE NEEDLE ASPIRATION:   FINAL MICROSCOPIC DIAGNOSIS:  - Consistent with benign follicular nodule (Bethesda category II)   SPECIMEN ADEQUACY:  Satisfactory for evaluation   CYTOLOGY - NON PAP  CASE: [id number]  PATIENT: Eliyahu Pe  Non-Gynecological Cytology Report   Clinical History: Nodule 2 Right Superior 1.0 cm; Other 2 dimensions:  0.6 x 0.7 cm, Solid almost completely  solid, Hypoechoic, ACR TI-RADS  total points: 7, Highly suspicious  nodule  Specimen Submitted:  A. THYROID , RT LOBE RUP, FINE NEEDLE ASPIRATION:   FINAL MICROSCOPIC DIAGNOSIS:  - Consistent with benign follicular nodule (Bethesda category II)   SPECIMEN ADEQUACY:  Satisfactory for evaluation  Thyroid  ultrasound (04/20/2020):  Parenchymal Echotexture: Moderately heterogeneous Isthmus: 0.8 cm Right lobe: 4.4 x 2.0 x 1.6 cm Left lobe: 4.8 x 2.0 x 1.8 cm _________________________________________________________  Nodule 1: 1.1 x 1.1 x 0.7 cm nodule located in the isthmus has decreased in size since prior examination where it measured 1.9 x 1.7 x 1.3 cm. FNA of this nodule was performed on 02/12/2019. Please correlate  with results.  Nodule 2: 1.0 x 0.9 x 0.6 cm nodule located in the superior right thyroid  lobe is not significantly changed in size measuring 1.0 x 0.7 x 0.6 cm on the prior exam. FNA of this nodule was performed on 02/12/2019. Please correlate with results.  Nodule 3: 1.2 x 0.9 x 0.7 cm cystic nodule located in the mid right thyroid  lobe is not significantly changed in size since prior exam where it measured 1.1 x 0.8 x 0.8 cm. It does not meet criteria for FNA or imaging follow-up.  Nodule 4: 0.6 x 0.6 x 0.3 cm solid hypoechoic nodule in the inferior right thyroid  lobe is new since the prior examination. It does not meet criteria for FNA or imaging follow-up.  Nodule 5: 1.6 x 0.9 x 0.8 cm cystic nodule in the superior left thyroid  lobe is not significantly changed in size since prior examination when where measured 1.5 x 1.0 x 0.9 cm. This does not meet criteria for FNA or imaging follow-up.  Nodule 6: 0.8 x 0.7 x 0.6 cm solid isoechoic nodule in the mid left thyroid  lobe previously measured 1.2 x 1.0 x 1.3 cm. Interval decrease in size favors a benign etiology. This nodule does not meet criteria for imaging follow-up or FNA. _________________________________________________________  IMPRESSION: Bilateral thyroid  nodules. Nodules 1 and 2 were previously biopsied. Please correlate with results. Remaining nodules do not meet criteria for FNA or imaging follow-up.  Thyroid  U/S (05/08/2022): Parenchymal Echotexture: Moderately heterogenous  Isthmus: 0.9 cm, previously 0.8 cm  Right lobe: 4.7 x 1.8 x 1.5 cm, previously 4.4 x 2.0 x 1.6 cm  Left lobe: 4.3 x 1.9 x 1.9 cm, previously 4.8 x 2.0 x 1.8 cm  _________________________________________________________   Estimated total number of nodules >/= 1 cm: 2 _________________________________________________________   Nodule #1 in the isthmus is heterogeneous but hypoechoic. Nodule measures 1.1 x 0.9 x 1.3 cm and previously measured  1.1 x 0.7 x 1.1 cm. Morphology of the nodule is not significantly changed. This represents the previously biopsied nodule.   Nodul #2 in the superior right thyroid  lobe is a hypoechoic nodule with at least 1 small echogenic focus. This represents the previously biopsied nodule and measures 1.1 x 0.6 x 0.8 cm and previously measured 1.0 x 0.6 x 0.9 cm. Morphology of the nodule is stable.   Multiple bilateral thyroid  nodules.   Nodule #5 in the left mid thyroid  lobe is a predominantly isoechoic solid nodule with small cystic component. This nodule measures 0.9 x 0.6 x 0.7 cm. This is compatible with a TR 3 nodule and does not meet criteria for biopsy or dedicated follow-up.   IMPRESSION: 1. Bilateral thyroid  nodules. 2. Previously biopsied nodules are stable. The other nodules do not meet criteria for biopsy or dedicated follow-up.  Pt denies: - feeling  nodules in neck - hoarseness - dysphagia - choking  Latest TSH was normal: Lab Results  Component Value Date   TSH 0.85 11/02/2023   ROS: + See HPI  I reviewed pt's medications, allergies, PMH, social hx, family hx, and changes were documented in the history of present illness. Otherwise, unchanged from my initial visit note.  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 Austin- thought hyperglycemia     Allergy    CAD (coronary artery disease) status post PTCA and stent to obtuse marginal branch in 2016 08/18/2014   aspirin, statin, coreg .   CAD- MI 08/17/2014 and stent day later.   effient a year now off  Dr. Krasowkski in [facility] on epic)     Formatting of this note might be different from the original.  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 optho 02/08/17      DM (diabetes mellitus) (HCC) 02/04/2006   Followed by Endocrine Dr. Trixie. #s improving under their care.   Insulin  long and short acting sliding scale, invokana  300mg , metformin  1000mg  BID  Must use fructosamine           GERD (gastroesophageal reflux disease) 08/19/2014   Hyperlipidemia    Hypertension    Multiple thyroid  nodules 03/31/2020   Myocardial infarction [facility])    May 2016   Nephrolithiasis    3x 2007-2015, passed on his own   RECTAL FISSURE 06/05/2006   Dilt gel when occurred in past        Sleep apnea    wears CPAP   Past Surgical History:  Procedure Laterality Date   CARDIAC CATHETERIZATION     stent May 2016   COLONOSCOPY     heart stent     May 2016   HERNIA REPAIR     Social History   Socioeconomic History   Marital status: Married    Spouse name: Not on file   Number of children: Not on file   Years of education: Not on file   Highest education level: 12th grade  Occupational History   Not on file  Tobacco Use   Smoking status: Never   Smokeless tobacco: Never  Substance and Sexual Activity   Alcohol use: No   Drug use: No   Sexual activity: Never  Other Topics Concern   Not on file  Social History Narrative   Married. 2 children son and daughter. Granddaughter 12/02/2014 born and grandson 7/17//2018.       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      Hobbies: golfing (low to mid 28s)   Social Drivers of Corporate Investment Banker Strain: Low Risk  (11/01/2023)   Overall Financial Resource Strain (CARDIA)    Difficulty of Paying Living Expenses: Not hard at all  Food Insecurity: No Food Insecurity (11/01/2023)   Hunger Vital Sign    Worried About Running Out of Food in the Last Year: Never true    Ran Out of Food in the Last Year: Never true  Transportation Needs: No Transportation Needs (11/01/2023)   PRAPARE - Administrator, Civil Service (Medical): No    Lack of Transportation  (Non-Medical): No  Physical Activity: Sufficiently Active (11/01/2023)   Exercise Vital Sign    Days of Exercise per Week: 5 days    Minutes of Exercise per Session: 30 min  Recent Concern: Physical Activity - Insufficiently Active (09/10/2023)   Exercise Vital Sign    Days of Exercise per Week: 2 days    Minutes of Exercise per Session: 30 min  Stress: No Stress Concern Present (11/01/2023)   [employer] of Occupational Health - Occupational Stress Questionnaire    Feeling of Stress: Not at all  Social Connections: Moderately Integrated (11/01/2023)   Social Connection and Isolation Panel    Frequency of Communication with Friends and Family: More than three times a week    Frequency of Social Gatherings with Friends and Family: More than three times a week    Attends Religious Services: More than 4 times per year    Active Member of [employer] or Organizations: No    Attends Banker Meetings: Never    Marital Status: Married  Catering Manager Violence: Not At Risk (11/01/2023)   Humiliation, Afraid, Rape, and Kick questionnaire    Fear of Current or Ex-Partner: No    Emotionally Abused: No    Physically Abused: No    Sexually Abused: No   Current Outpatient Medications on File Prior to Visit  Medication Sig Dispense Refill   aspirin 81 MG tablet Take 81 mg by mouth daily.     atorvastatin  (LIPITOR) 40 MG tablet Take 1 tablet (40 mg total) by mouth daily. 90 tablet 3   carvedilol  (COREG ) 3.125 MG tablet TAKE ONE TABLET BY MOUTH TWICE DAILY 60 tablet 3   empagliflozin  (JARDIANCE ) 25 MG TABS tablet Take 1 tablet (25 mg total) by mouth daily. 90 tablet 3   EQ FIBER SUPPLEMENT PO Take 1 tablet by mouth daily. Unknown strength     fluticasone (FLONASE) 50 MCG/ACT nasal spray Place 1 spray into both nostrils daily as needed for allergies or rhinitis.     insulin  glargine, 2 Unit Dial , (TOUJEO  MAX SOLOSTAR) 300 UNIT/ML Solostar Pen Inject 70 Units into the skin daily. 24 mL 3    insulin  lispro (HUMALOG  KWIKPEN) 100 UNIT/ML KwikPen INJECT 20-25 UNITS SUBCUTANEOUSLY THREE TIMES DAILY     lisinopril  (ZESTRIL ) 40 MG tablet Take 1 tablet (40 mg total) by mouth daily. 90 tablet 3   meloxicam  (MOBIC ) 15 MG tablet Take 1 tablet (15 mg total) by mouth daily. 30 tablet 0   metFORMIN  (GLUCOPHAGE ) 1000 MG tablet Take 1 tablet (1,000 mg total) by mouth 2 (two) times daily with a meal. 180 tablet 3   nitroGLYCERIN  (NITROSTAT ) 0.4 MG SL tablet Place 1 tablet (0.4 mg total) under the tongue every 5 (five) minutes as needed for chest pain. 25 tablet 5   Omega-3 Fatty Acids (FISH OIL) 1000 MG CAPS Take 4 capsules by mouth daily.     OZEMPIC , 1 MG/DOSE, 4 MG/3ML SOPN Inject 1 mg into the skin once a week. 9 mL 3   traZODone (DESYREL) 50 MG tablet 1 or 2 tabs at bedtime as needed for sleep 30 tablet 2   vitamin B-12 (CYANOCOBALAMIN ) 500 MCG tablet Take 500 mcg by mouth daily.     No current facility-administered medications on file prior to visit.   Allergies  Allergen Reactions   Invokana  [Canagliflozin ] Other (See Comments)    Yeast infection   Family History  Problem Relation Age of Onset   Coronary artery disease Father    Liver disease  Father        from heart meds per pt   Diabetes Sister        grandmother   Kidney disease Sister        dialysis from dm   Diabetes Sister    Other Sister        flash pulmonary edema led to death   Colon cancer Neg Hx    Esophageal cancer Neg Hx    Rectal cancer Neg Hx    Stomach cancer Neg Hx    PE: BP 124/80 (BP Location: Left Arm, Patient Position: Sitting, Cuff Size: Normal)   Pulse 83   Ht 5' 10 (1.778 m)   Wt 226 lb (102.5 kg)   SpO2 97%   BMI 32.43 kg/m   Wt Readings from Last 15 Encounters:  01/23/24 226 lb (102.5 kg)  12/27/23 231 lb 6.4 oz (105 kg)  12/25/23 229 lb 12.8 oz (104.2 kg)  11/20/23 227 lb (103 kg)  11/02/23 227 lb 12.8 oz (103.3 kg)  11/01/23 228 lb (103.4 kg)  09/13/23 231 lb 12.8 oz (105.1 kg)   09/11/23 231 lb 6.4 oz (105 kg)  08/01/23 236 lb (107 kg)  05/03/23 236 lb 12.8 oz (107.4 kg)  04/27/23 234 lb 9.6 oz (106.4 kg)  12/22/22 233 lb 9.6 oz (106 kg)  10/12/22 234 lb (106.1 kg)  09/04/22 234 lb 6.4 oz (106.3 kg)  08/22/22 235 lb (106.6 kg)   Constitutional: overweight, in NAD Eyes: EOMI, no exophthalmos ENT:no thyromegaly but left thyroid  nodule felt on palpation, no cervical lymphadenopathy Cardiovascular: RRR, No MRG Respiratory: CTA B Musculoskeletal: no deformities Skin: no rashes Neurological: no tremor with outstretched hands Diabetic Foot Exam - Simple   Simple Foot Form Diabetic Foot exam was performed with the following findings: Yes 01/23/2024  9:16 AM  Visual Inspection No deformities, no ulcerations, no other skin breakdown bilaterally: Yes Sensation Testing Intact to touch and monofilament testing bilaterally: Yes Pulse Check Posterior Tibialis and Dorsalis pulse intact bilaterally: Yes Comments    ASSESSMENT: 1. DM2, insulin -dependent, controlled, with complications - CAD, s/p AMi - DR  He does not have a history of pancreatitis or family history of medullary thyroid  cancer.    2. HL  3.Thyroid  nodules  PLAN:  1. Patient with longstanding, fairly well-controlled type 2 diabetes, on complex medication regimen with metformin , SGLT2 inhibitor, weekly GLP-1 receptor agonist basal/bolus insulin  regimen, with slightly better control at last visit.  HbA1c at that time decreased from 7.7% to 7.0%.  He had some low blood sugars overnight, down to 59, but also in the morning, down to the 60s.  Upon questioning, he was taking a higher dose of Humalog  before dinner than recommended (34-36 units), and I advised him to reduce this to 20 to 25 units.  Sugars and the rest of the day appears to be mostly at goal so we continued the rest of the Humalog  doses.  To avoid further drops in blood sugars overnight, I also advised him to reduce the Toujeo  dose.  At last  visit, I refilled his Toujeo , Jardiance , and metformin . -At today's visit, the majority of his blood sugars are at goal.  They have been slightly higher in the last few days, possibly due to his congestion.  He did start to improve his diet along with his wife and he is also walking more.  For now, I did not suggest a change in regimen but we did discuss that after the holidays, he  can reduce the dose of of his long-acting insulin .  At next visit, if he continues his lifestyle changes, we will likely can reduce his Humalog  doses, also. - I suggested to:  Patient Instructions  Please continue: - Metformin  1000 mg 2x a day. - Jardiance  25 mg daily before breakfast  - Ozempic  1 mg weekly - Humalog : - 25 units before b'fast - 20-25 units before lunch - 20-25 units before dinner - Toujeo  70 units daily (may decrease to 60 units after the Holidays)  Please return in 3-4 months with your sugar log.  - we checked his HbA1c: 6.8% (lower) - advised to check sugars at different times of the day - 4x a day, rotating check times - advised for yearly eye exams >> he is not UTD - return to clinic in 3-4 months  2. HL - Latest lipid panel showed an LDL at goal, slightly low HDL, and slightly high triglycerides, Lab Results  Component Value Date   CHOL 128 11/02/2023   HDL 36.20 (L) 11/02/2023   LDLCALC 55 11/02/2023   LDLDIRECT 52.0 10/12/2022   TRIG 183.0 (H) 11/02/2023   CHOLHDL 4 11/02/2023  -He continues on atorvastatin  40 mg daily and fish oil 1000 mg daily without side effects  3.  Thyroid  nodules - He denies neck compression symptoms - latest TSH was normal: Lab Results  Component Value Date   TSH 0.85 11/02/2023  -The thyroid  ultrasound report from 03/2020 showed 2 dominant nodules, which were biopsied with benign results.  The rest of the nodules were small and not worrisome.  We obtained another ultrasound in 04/2022 which showed stable nodules. - We will continue to follow him  clinically for now  Lela Fendt, MD PhD [facility] Endocrinology

## 2024-01-23 NOTE — Addendum Note (Signed)
 Addended by: OCTAVIO DIETRICH CROME on: 01/23/2024 09:53 AM   Modules accepted: Orders

## 2024-01-23 NOTE — Patient Instructions (Addendum)
 Please continue: - Metformin  1000 mg 2x a day. - Jardiance  25 mg daily before breakfast  - Ozempic  1 mg weekly - Humalog : - 25 units before b'fast - 20-25 units before lunch - 20-25 units before dinner - Toujeo  70 units daily (may decrease to 60 units after the Holidays)  Please return in 3-4 months with your sugar log.

## 2024-02-11 ENCOUNTER — Telehealth: Payer: Self-pay

## 2024-02-11 DIAGNOSIS — G4733 Obstructive sleep apnea (adult) (pediatric): Secondary | ICD-10-CM

## 2024-02-11 NOTE — Telephone Encounter (Signed)
 Copied from CRM [id number]. Topic: Clinical - Lab/Test Results >> Feb 08, 2024  4:07 PM Celestine FALCON wrote: Reason for CRM: Pt is waiting for the results of his sleep test. Pt stated he sent it to [facility] about 3 weeks ago now. Pt stated by the time the results do come back he would have to schedule with a new provider due to Dr.Young retiring.   I tried to schedule via transfer of care, but the pt prefers to wait for the results.  Please contact the pt with an update on the test at phone number [phone] ok to leave a vm.   [facility] do we have these results?

## 2024-02-12 NOTE — Telephone Encounter (Signed)
 Dr. Neysa can you please advise of results.

## 2024-02-12 NOTE — Telephone Encounter (Signed)
 Home sleep test on 01/08/24 showed severe obstructive sleep apnea, AHI 30/hr. I recommend we order new D/mE, new CPAP auto 5-20, mask of choice, humidifier, supplies, AirView/ card  He should have a return ov with a sleep provider or APP in 31-90 days, per insurance rules.

## 2024-02-13 NOTE — Telephone Encounter (Signed)
 Called and spoke with the pt and advised of Dr. Neysa result note. Pt verbalized understanding and would like to start on CPAP.  Order has been placed. Nothing further needed.

## 2024-02-26 ENCOUNTER — Other Ambulatory Visit: Payer: Self-pay | Admitting: Internal Medicine

## 2024-03-25 ENCOUNTER — Ambulatory Visit: Admitting: Adult Health

## 2024-04-02 ENCOUNTER — Other Ambulatory Visit: Payer: Self-pay | Admitting: Family Medicine

## 2024-04-11 ENCOUNTER — Telehealth: Payer: Self-pay | Admitting: Adult Health

## 2024-04-11 NOTE — Telephone Encounter (Signed)
 PT at the front desk is asking for a call back regarding notice of denial in payment.  Date of service is 02/27/2024 for a cpap machine

## 2024-04-22 ENCOUNTER — Encounter: Payer: Self-pay | Admitting: Adult Health

## 2024-04-22 ENCOUNTER — Ambulatory Visit: Admitting: Adult Health

## 2024-04-22 VITALS — BP 144/79 | HR 80 | Ht 70.0 in | Wt 228.4 lb

## 2024-04-22 DIAGNOSIS — Z6832 Body mass index (BMI) 32.0-32.9, adult: Secondary | ICD-10-CM | POA: Diagnosis not present

## 2024-04-22 DIAGNOSIS — I10 Essential (primary) hypertension: Secondary | ICD-10-CM | POA: Diagnosis not present

## 2024-04-22 DIAGNOSIS — G4733 Obstructive sleep apnea (adult) (pediatric): Secondary | ICD-10-CM | POA: Diagnosis not present

## 2024-04-22 NOTE — Patient Instructions (Addendum)
 Change CPAP pressure to 5-15cmH2o.  Continue on CPAP At bedtime   Keep up good work  Work on [employer]  Do not drive if sleepy  Follow up in 1 year with Dr. Olena or Faithe Ariola NP and As needed

## 2024-04-22 NOTE — Telephone Encounter (Signed)
 Spoke with Tammy at [facility] and the claim was originally denied but it has now been corrected and submitted on 04/17/2024. Stated to will have to be patient while insurance is being processed. Pt has been made aware and voiced understanding. NFN

## 2024-04-22 NOTE — Telephone Encounter (Signed)
 Please disregard previous message.

## 2024-04-22 NOTE — Telephone Encounter (Signed)
 Insurance says CPAP was being denied due no provider linked to order.  Can [facility] team check with DME to see if billing has taken care of this.  Is this due to Dr. Neysa  retirement.

## 2024-04-22 NOTE — Telephone Encounter (Signed)
 I have asked the DME and will advise

## 2024-04-22 NOTE — Progress Notes (Signed)
 "  @Patient  ID: Zachary Warner, male    DOB: December 23, 1955, 69 y.o.   MRN: [id number]  Chief Complaint  Patient presents with   Obstructive Sleep Apnea    F/u    Referring provider: Katrinka Garnette KIDD, MD  HPI: 69 year old male seen for sleep consult September 2025 to establish for sleep apnea History significant for coronary artery disease status post stent, diabetes and hypertension    TEST/EVENTS : Reviewed 04/22/2024  Home sleep study January 08, 2024 showed severe sleep apnea with AHI at 30/hour and SpO2 low at 86%  .04/22/2024 Follow up ; OSA  Patient presents for a follow-up for severe sleep apnea.  Patient had longstanding sleep apnea was seen in September to establish for sleep apnea.  Patient was set up for a repeat sleep study that was performed January 08, 2024 that showed severe sleep apnea with AHI at 30/hour and SpO2 low at 86%.  A new CPAP machine was ordered and patient has received.  He says it is working very well.  Patient says he has been on CPAP for many years and cannot sleep without it.  Feels that he benefits greatly from his CPAP machine feels rested.  Typically uses a nasal pillows but recently switched to fullface mask which he says is working very well for him.  Patient says he remains very active works part-time and plays golf on a regular basis.  CPAP download shows excellent compliance with 100% usage.  Daily average usage at 9.5 hours.  Patient is on auto CPAP 5 to 20 cm H2O.  AHI is 1.6/hour with daily average pressure at 9.7 cm H2O. Patient says he continues to work on weight loss.  Is down about 20 pounds over the last couple years.  Patient does have diabetes and is on multiple medications including Ozempic .   Allergies[1]  Immunization History  Administered Date(s) Administered   Fluad Quad(high Dose 65+) 02/23/2022   INFLUENZA, HIGH DOSE SEASONAL PF 01/08/2021, 12/26/2022, 02/09/2023, 01/08/2024   Influenza Split 04/17/2011   Influenza Whole 01/02/2007,  01/07/2009, 02/04/2010, 01/06/2012   Influenza, Quadrivalent, Recombinant, Inj, Pf 01/04/2019   Influenza,inj,Quad PF,6+ Mos 03/14/2013, 02/03/2014, 01/06/2015, 11/28/2017, 03/03/2020   Influenza-Unspecified 12/25/2012, 01/28/2016, 01/13/2017, 01/10/2022   Moderna Covid-19 Fall Seasonal Vaccine 101yrs & older 01/10/2022, 12/14/2022, 02/09/2023   PFIZER Comirnaty(Gray Top)Covid-19 Tri-Sucrose Vaccine 10/04/2020   PFIZER(Purple Top)SARS-COV-2 Vaccination 05/30/2019, 06/09/2019, 06/30/2019, 03/04/2020   PNEUMOCOCCAL CONJUGATE-20 04/04/2021   Pfizer Covid-19 Vaccine Bivalent Booster 54yrs & up 02/23/2022   Pneumococcal Conjugate-13 07/15/2013   Pneumococcal Polysaccharide-23 05/11/2008, 08/19/2014   Tdap 07/15/2013, 08/13/2023   Zoster Recombinant(Shingrix ) 02/24/2019, 08/26/2019    Past Medical History:  Diagnosis Date   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 Austin- thought hyperglycemia     Allergy    CAD (coronary artery disease) status post PTCA and stent to obtuse marginal branch in 2016 08/18/2014   aspirin, statin, coreg .   CAD- MI 08/17/2014 and stent day later.   effient a year now off  Dr. Krasowkski in [place] [facility] on epic)     Formatting of this note might be different from the original.  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 optho 02/08/17     DM (diabetes mellitus) (HCC) 02/04/2006   Followed by Endocrine Dr.  Gherghe. #s improving under their care.   Insulin  long and short acting sliding scale, invokana  300mg , metformin  1000mg  BID  Must use fructosamine           GERD (gastroesophageal reflux disease) 08/19/2014   Hyperlipidemia    Hypertension    Multiple thyroid  nodules 03/31/2020   Myocardial infarction [facility])    May 2016   Nephrolithiasis    3x  2007-2015, passed on his own   RECTAL FISSURE 06/05/2006   Dilt gel when occurred in past        Sleep apnea    wears CPAP    Tobacco History: Tobacco Use History[2] Counseling given: Not Answered   Outpatient Medications Prior to Visit  Medication Sig Dispense Refill   aspirin 81 MG tablet Take 81 mg by mouth daily.     atorvastatin  (LIPITOR) 40 MG tablet Take 1 tablet (40 mg total) by mouth daily. 90 tablet 3   carvedilol  (COREG ) 3.125 MG tablet TAKE ONE TABLET BY MOUTH TWICE DAILY 60 tablet 3   empagliflozin  (JARDIANCE ) 25 MG TABS tablet Take 1 tablet (25 mg total) by mouth daily. 90 tablet 3   EQ FIBER SUPPLEMENT PO Take 1 tablet by mouth daily. Unknown strength (Patient taking differently: Take 1 tablet by mouth daily as needed. Unknown strength)     fluticasone (FLONASE) 50 MCG/ACT nasal spray Place 1 spray into both nostrils daily as needed for allergies or rhinitis.     insulin  glargine, 2 Unit Dial , (TOUJEO  MAX SOLOSTAR) 300 UNIT/ML Solostar Pen Inject 70 Units into the skin daily. 24 mL 3   insulin  lispro (HUMALOG  KWIKPEN) 100 UNIT/ML KwikPen INJECT 20-25 UNITS SUBCUTANEOUSLY THREE TIMES DAILY     lisinopril  (ZESTRIL ) 40 MG tablet Take 1 tablet (40 mg total) by mouth daily. 90 tablet 3   metFORMIN  (GLUCOPHAGE ) 1000 MG tablet Take 1 tablet (1,000 mg total) by mouth 2 (two) times daily with a meal. 180 tablet 3   nitroGLYCERIN  (NITROSTAT ) 0.4 MG SL tablet Place 1 tablet (0.4 mg total) under the tongue every 5 (five) minutes as needed for chest pain. 25 tablet 5   Omega-3 Fatty Acids (FISH OIL) 1000 MG CAPS Take 4 capsules by mouth daily. (Patient taking differently: Take 1 capsule by mouth daily.)     Semaglutide , 1 MG/DOSE, (OZEMPIC , 1 MG/DOSE,) 4 MG/3ML SOPN Inject 1 mg into the skin once a week. 9 mL 3   traZODone  (DESYREL ) 50 MG tablet take 1 or 2 TABLETS at bedtime as needed for sleep 60 tablet 5   vitamin B-12 (CYANOCOBALAMIN ) 500 MCG tablet Take 500 mcg by mouth daily.      meloxicam  (MOBIC ) 15 MG tablet Take 1 tablet (15 mg total) by mouth daily. (Patient not taking: Reported on 04/22/2024) 30 tablet 0   No facility-administered medications prior to visit.     Review of Systems:   Constitutional:   No  weight loss, night sweats,  Fevers, chills, fatigue, or  lassitude.  HEENT:   No headaches,  Difficulty swallowing,  Tooth/dental problems, or  Sore throat,                No sneezing, itching, ear ache, nasal congestion, post nasal drip,   CV:  No chest pain,  Orthopnea, PND, swelling in lower extremities, anasarca, dizziness, palpitations, syncope.   GI  No heartburn, indigestion, abdominal pain, nausea, vomiting, diarrhea, change in bowel habits, loss of appetite, bloody stools.   Resp: No shortness of breath with exertion or  at rest.  No excess mucus, no productive cough,  No non-productive cough,  No coughing up of blood.  No change in color of mucus.  No wheezing.  No chest wall deformity  Skin: no rash or lesions.  GU: no dysuria, change in color of urine, no urgency or frequency.  No flank pain, no hematuria   MS:  No joint pain or swelling.  No decreased range of motion.  No back pain.    Physical Exam  BP (!) 146/84   Pulse 80   Ht 5' 10 (1.778 m) Comment: Per pt  Wt 228 lb 6.4 oz (103.6 kg)   SpO2 97% Comment: RA  BMI 32.77 kg/m   GEN: A/Ox3; pleasant , NAD, well nourished    HEENT:  [place]/AT,  NOSE-clear, THROAT-clear, no lesions, no postnasal drip or exudate noted. Class 3 MP airway   NECK:  Supple w/ fair ROM; no JVD; normal carotid impulses w/o bruits; no thyromegaly or nodules palpated; no lymphadenopathy.    RESP  Clear  P & A; w/o, wheezes/ rales/ or rhonchi. no accessory muscle use, no dullness to percussion  CARD:  RRR, no m/r/g, no peripheral edema, pulses intact, no cyanosis or clubbing.  GI:   Soft & nt; nml bowel sounds; no organomegaly or masses detected.   Musco: Warm bil, no deformities or joint swelling noted.    Neuro: alert, no focal deficits noted.    Skin: Warm, no lesions or rashes    Lab Results:Reviewed 04/22/2024   CBC    BNP No results found for: BNP  ProBNP No results found for: PROBNP  Imaging: No results found.  Administration History     None           No data to display          No results found for: NITRICOXIDE     12/25/2023    2:00 PM  Results of the [place] flowsheet  Sitting and reading 3  Watching TV 3  Sitting, inactive in a public place (e.g. a theatre or a meeting) 2  As a passenger in a car for an hour without a break 1  Lying down to rest in the afternoon when circumstances permit 2  Sitting and talking to someone 0  Sitting quietly after a lunch without alcohol 2  In a car, while stopped for a few minutes in traffic 0  Total score 13        Assessment & Plan:   Assessment and Plan Severe obstructive sleep apnea-excellent control and compliance on CPAP.  Has perceived benefit.  Average CPAP pressure at 9.7 cm H2O.  Will adjust titration pressure to auto CPAP 5 to 15 cm H2O.  CPAP care discussed in detail. Morbid obesity with BMI at 32.  Continue on current weight loss management plan. Hypertension.  Blood pressure elevated today.  Advised to follow-up with primary care for ongoing management Plan  Patient Instructions  Change CPAP pressure to 5-15cmH2o.  Continue on CPAP At bedtime   Keep up good work  Work on [employer]  Do not drive if sleepy  Follow up in 1 year with Dr. Olena or Mahdi Frye NP and As needed            Madelin Stank, NP 04/22/2024       [1]  Allergies Allergen Reactions   Invokana  [Canagliflozin ] Other (See Comments)    Yeast infection  [2]  Social History Tobacco Use  Smoking Status Never  Smokeless Tobacco Never   "

## 2024-05-07 ENCOUNTER — Encounter: Admitting: Family Medicine

## 2024-05-30 ENCOUNTER — Ambulatory Visit: Admitting: Internal Medicine

## 2024-11-04 ENCOUNTER — Ambulatory Visit
